# Patient Record
Sex: Male | Born: 1950 | Race: White | Hispanic: No | Marital: Married | Smoking: Never smoker
Health system: Southern US, Community
[De-identification: ages and names within clinical notes are randomized; demographics above are authoritative.]

## PROBLEM LIST (undated history)

## (undated) DIAGNOSIS — I639 Cerebral infarction, unspecified: Secondary | ICD-10-CM

---

## 2017-10-03 ENCOUNTER — Encounter: Payer: Self-pay | Admitting: Occupational Therapy

## 2017-10-03 ENCOUNTER — Ambulatory Visit: Payer: Medicare Other | Admitting: Occupational Therapy

## 2017-10-03 ENCOUNTER — Ambulatory Visit: Payer: Medicare Other | Attending: Physical Medicine and Rehabilitation | Admitting: Speech Pathology

## 2017-10-03 DIAGNOSIS — I69351 Hemiplegia and hemiparesis following cerebral infarction affecting right dominant side: Secondary | ICD-10-CM

## 2017-10-03 DIAGNOSIS — M6281 Muscle weakness (generalized): Secondary | ICD-10-CM | POA: Insufficient documentation

## 2017-10-03 DIAGNOSIS — R29818 Other symptoms and signs involving the nervous system: Secondary | ICD-10-CM | POA: Diagnosis present

## 2017-10-03 DIAGNOSIS — R278 Other lack of coordination: Secondary | ICD-10-CM

## 2017-10-03 DIAGNOSIS — I69318 Other symptoms and signs involving cognitive functions following cerebral infarction: Secondary | ICD-10-CM

## 2017-10-03 DIAGNOSIS — R482 Apraxia: Secondary | ICD-10-CM | POA: Insufficient documentation

## 2017-10-03 DIAGNOSIS — R4701 Aphasia: Secondary | ICD-10-CM | POA: Diagnosis not present

## 2017-10-03 NOTE — Patient Instructions (Signed)
  Tips for Talking with People who have Aphasia  . Say one thing at a time . Don't  rush - slow down, be patient . Talk face to face . Reduce background noise . Relax - be natural . Use pen and paper . Write down key words . Draw diagrams or pictures . Don't pretend you understand . Ask what helps . Recap - check you both understand . Be a partner, not a therapist   Aphasia does not affect intelligence, only language. The person with aphasia can still: make decisions, have opinions, and socialize.   Describing words  What group does it belong to?  What do I use it for?  Where can I find it?  What does it LOOK like?  What other words go with it?  What is the 1st sound of the word?  Many Ways to Communicate  Describe it Write it Draw it Gesture it Use related words  There's an App for that: Family Feud, Heads up, Stop-fun categories, What if, Conversation TherAPPy  Provided by: Rolin Barry. ST, [phone]

## 2017-10-03 NOTE — Therapy (Signed)
[facility] [address] [place], [place], [address] Phone: [phone]   Fax:  [phone]  Speech Language Pathology Evaluation  Patient Details  Name: [name] MRN: [id number] Date of Birth: 05-20-1951 Referring Provider: Dr. Lorretta Harp  Encounter Date: 10/03/2017      End of Session - 10/03/17 1429    Date for SLP Re-Evaluation 12/27/17      No past medical history on file.  No past surgical history on file.  There were no vitals filed for this visit.      Subjective Assessment - 10/03/17 1305    Subjective I...uh been through it"   Patient is accompained by: Family member  spouse [name]   Currently in Pain? No/denies            SLP Evaluation [facility] - 10/03/17 1200      SLP Visit Information   Referring Provider Dr. Lorretta Harp   Onset Date 09/11/17   Medical Diagnosis left CVA     Subjective   Patient/Family Stated Goal To make him speak     General Information   HPI [name] a 66 y.o.malewith a PMH of HTN, GERD, and afib (CHADsVASc 3)not on AC,who presents on 9/19 as thrombectomy stroke code with right sided weakness and aphasiaCT Brain was repeated without hemorrhage, some L insula hypodensity, ASPECT 9. He was taken urgently to IR. TICI 3 reperfusion achieved with 1 pass . He required intubation.He was extubated on 9/20 and also had TTE at that time which showed preserved EF, no mass and negative bubble. He was started on aspirin on 9/21 but discontinued on 9/23 when the NICU started him on Heparin gtt with repeat CT head showing evolving Lt MCA stroke but no hemorrhage  Most recent MBSS recommended regular solids/thin liquids. Dysphagia has resolved per spouse. PEG to be removed 10/31/17. Pt taking all PO by mouth.   Mobility Status walks independently, denies balance issues or falls     Prior Functional Status   Cognitive/Linguistic Baseline Within functional limits    Type of Home House    Lives With Spouse   Available Support Family   Vocation Retired     IT consultant   Overall Cognitive Status Difficult to assess   Difficult to assess due to Impaired communication  severe aphasia     Auditory Comprehension   Overall Auditory Comprehension Impaired   Yes/No Questions Impaired   Basic Immediate Environment Questions 25-49% accurate   Complex Questions 0-24% accurate   Commands Impaired   One Step Basic Commands 0-24% accurate   Two Step Basic Commands 0-24% accurate   Conversation Simple   Other Conversation Comments comprehension improves with context cues   EffectiveTechniques Extra processing time;Pausing;Repetition;Slowed speech;Visual/Gestural cues     Reading Comprehension   Reading Status Impaired   Word level 0-25% accurate   Sentence Level 26-50% accurate     Expression   Primary Mode of Expression Verbal     Verbal Expression   Overall Verbal Expression Impaired   Automatic Speech Name;Counting;Day of week;Month of year  80% accurate, minimal phonemic paraphasia vs perseveration   Level of Generative/Spontaneous Verbalization Word  automatic social    Repetition No impairment   Naming Impairment   Responsive 0-25% accurate   Confrontation 0-24% accurate   Convergent Not tested   Divergent Not tested   Verbal Errors Phonemic paraphasias;Perseveration;Aware of errors;Inconsistent   Pragmatics No impairment   Effective Techniques Phonemic cues;Sentence completion;Written cues  Written Expression   Dominant Hand Right   Written Expression Exceptions to [facility]   Copy Ability Word   Self Formulation Ability Word  name only - 60% accurate     Oral Motor/Sensory Function   Overall Oral Motor/Sensory Function Impaired   Labial ROM Reduced right   Lingual ROM Within Functional Limits   Facial ROM Within Functional Limits   Velum Within Functional Limits     Motor Speech   Overall Motor Speech Impaired   Respiration  Within functional limits   Phonation Normal   Resonance Within functional limits   Articulation Within functional limitis   Intelligibility Intelligibility reduced   Word 50-74% accurate   Phrase 50-74% accurate   Motor Planning Impaired   Level of Impairment Phrase   Motor Speech Errors Groping for words;Inconsistent   Effective Techniques Slow rate;Pause                         SLP Education - 10/03/17 1317    Education provided Yes   Education Details aphasia ed; non verbal compensations for comprehension; homework; goals for ST   Person(s) Educated Patient;Spouse   Methods Explanation;Demonstration;Verbal cues;Handout   Comprehension Verbalized understanding;Returned demonstration;Verbal cues required;Need further instruction          SLP Short Term Goals - 10/03/17 1403      SLP SHORT TERM GOAL #1   Title Pt will ID object to spoken word f:3 8/10x with occasional min gestural cues.   Time 4   Period Weeks   Status New     SLP SHORT TERM GOAL #2   Title Pt will ID object to written word f:4 with occasional min A 8/10x   Time 4   Period Weeks   Status New     SLP SHORT TERM GOAL #3   Title Pt will name basic objects with sentence completion cues 8/10x   Time 4   Period Weeks   Status New     SLP SHORT TERM GOAL #4   Title Pt will write biographical information/family names with occasional mod A 4/5x   Time 4   Period Weeks   Status New     SLP SHORT TERM GOAL #5   Title Spouse will utilize gestures and written word cues to communicate with pt in simple conversation of 3-4 turns with occasional min A.   Time 4   Period Weeks   Status New          SLP Long Term Goals - 10/03/17 1411      SLP LONG TERM GOAL #1   Title Pt will ID object to spoken phrase/description f:3 8/10x   Time 12   Period Weeks   Status New     SLP LONG TERM GOAL #2   Title Pt will ID object to written phrase f:4 8/10x and occasional min A   Time 12   Period  Weeks   Status New     SLP LONG TERM GOAL #3   Title Pt will name 5 items in a simple category with occasional min A.   Time 12   Period Weeks   Status New     SLP LONG TERM GOAL #4   Title Pt will write 1-2 words in sentence completion task 8/10x with occasional min A   Time 12   Period Weeks   Status New     SLP LONG TERM GOAL #5   Title Pt/spouse will  utilize multimodal communication for simple conversation of 4-5 turns with occasional min A over 2 sessions.    Time 12   Period Weeks   Status New          Plan - October 23, 2017 1415    Clinical Impression Statement [name] is a 66 y.o. 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 He is accompanied by his spouse, [name].  Auditory comprehension of 1 step commands, simple yes/no questions is less than 50% accurate. Auditory comprehension within a context improves somewhat. Repetition is relatively intact. Automatic speech is realatively intact. 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 Pt does have some spontaneous greetings/social responses. Confrontation naming of basic object is less than 50%. Responsive naming is less than 50%. Semantic and phonemic cues were not successful assistance for naming. Pt did somewhat better with phrase completion for naming, however he perseverated on repeating my phrase, semantic cues or questions.  Reading comprehension at word level for body parts and environmental objects  is less than 50% accurate. Pt wrote his name with 70% accuracy and some aphasic errors. He has right side UE weakness and used pencil with larger grip.  Pt completed opposites and common pairs with 85% accuracy  and recited the Lord's Prayer and Pledge with phonemic paraphasias (again, more automatic type speech). Pt and spouse were educated re: aphasia and introduced to non verbal compensations to help communication at home. Simple conversation with 1-2 word non fluent utterances with phonemic paraphasias. I recommend skilled ST to maximize communication (comprehension and expression) for return to independence.    Speech Therapy Frequency 3x / week   Duration --  12 weeks   Treatment/Interventions Internal/external aids;Compensatory strategies;Environmental controls;Compensatory techniques;Language facilitation;Cueing hierarchy;Multimodal communcation approach;Functional tasks;Patient/family education;SLP instruction and feedback;Cognitive reorganization   Potential to Achieve Goals Good   Potential Considerations Severity of impairments   Consulted and Agree with Plan of Care Patient;Family member/caregiver   Family Member Consulted spouse [name]      Patient will benefit from skilled therapeutic intervention in order to improve the following deficits and impairments:   Aphasia - Plan: SLP plan of care cert/re-cert      G-Codes - 10-23-2017 1319    Functional Assessment Tool Used NOMS   Functional Limitations Spoken language expressive   Spoken Language Expression Current Status [phone]) At least 60 percent but less than 80 percent impaired, limited or restricted   Spoken Language Expression Goal Status (U0454) At least 20 percent but less than 40 percent impaired, limited or restricted      Problem List There are no active problems to display for this patient.   Sion Thane, Radene Journey MS, CCC-SLP Oct 23, 2017, 2:31 PM  [facility] [address] [place], [place], [address] Phone: [phone]   Fax:  [phone]  Name: [name] MRN: [id number] Date of Birth: 08/06/1951

## 2017-10-04 NOTE — Therapy (Signed)
[facility] [address] [place], [place], [address] Phone: [phone]   Fax:  [phone]  Occupational Therapy Evaluation  Patient Details  Name: [name] MRN: [id number] Date of Birth: 10-20-1951 Referring Provider: L MCA CVA  Encounter Date: 10/03/2017      OT End of Session - 10/03/17 1233    Visit Number 1   Number of Visits 16   Date for OT Re-Evaluation 11/28/17   Authorization Type medicare will need PN and G code every 10th visit   Authorization Time Period 60 days   Authorization - Visit Number 1   Authorization - Number of Visits 10   OT Start Time 1317   OT Stop Time 1359   OT Time Calculation (min) 42 min   Activity Tolerance Patient tolerated treatment well      History reviewed. No pertinent past medical history.  History reviewed. No pertinent surgical history.  There were no vitals filed for this visit.      Subjective Assessment - 10/03/17 1317    Subjective  How are you? Pt able to utter spontaneous familiar phrases   Patient is accompained by: Family member  wife [name]   Patient Stated Goals Pt globally aphasic   Currently in Pain? No/denies           [facility] OT Assessment - 10/03/17 1318      Assessment   Referring Provider L MCA CVA/ Hoy Register   Onset Date 09/11/17   Prior Therapy inpt rehab at [place], [place], OT and ST.     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   Leisure working on cars, yard work, church work     ADL   Eating/Feeding Maximal assistance  pt apraxic, difficulty in using hand appropriately   Grooming Minimal assistance  wife assists with shaving   Upper Body Bathing Supervision/safety   Lower Body Bathing Supervision/safety   Occupational hygienist - Clothing Manipulation Modified independent   Toileting -  Hygiene Supervision/safety   ADL comments Wife states pt has only been home 2 days and she is closely supervising     IADL   Shopping Needs to be accompanied on any shopping trip   Light Housekeeping Needs help with all home maintenance tasks   Meal Prep --  n/a pt did not cook at all   [employer] on family or friends for transportation   Medication Management Is not capable of dispensing or managing own medication   Financial Management Dependent     Mobility   Mobility Status Needs assist   Mobility Status Comments supevision in the home and community due to cognitive and language deficits  Written Expression   Dominant Hand Right   Handwriting 75% legible  for name     Vision - History   Baseline Vision Wears glasses only for reading     Vision Assessment   Eye Alignment Impaired (comment)  mild malalignment   Ocular Range of Motion Within Functional Limits   Tracking/Visual Pursuits Able to track stimulus in all quads without difficulty   Comment Pt with aphasia but appear to be indicating some blurry vision     Activity Tolerance   Activity Tolerance Tolerates 30 min activity with muliple rests     Cognition   Mini Mental State Exam  Pt is aphasic and also apraxic therefore very difficult to assess cognition will continue to asses via functional tasks.     Sensation   Light  Touch Appears Intact;Impaired by gross assessment   Hot/Cold Appears Intact  wife states pt has adjusted the water temp in the sink   Additional Comments pt is able to communicate pins and needles in R hand.      Coordination   Gross Motor Movements are Fluid and Coordinated No   Fine Motor Movements are Fluid and Coordinated No   Other mild incoordination;  pt with signficant apraxia     Perception   Perception Impaired  r inattention     Praxis   Praxis Impaired   Praxis Impairment Details Ideomotor     Edema   Edema mild R hand     ROM / Strength   AROM / PROM / Strength AROM;Strength     AROM   Overall AROM  Within functional limits for tasks performed   Overall AROM Comments BUE's     Strength   Overall Strength Deficits   Overall Strength Comments Difficult to assess due to apraxia, inattention and language deficits. Pt has signficant R pronator drift even with eyes open so at least 3/5.       Hand Function   Right Hand Gross Grasp Impaired   Right Hand Grip (lbs) 45   Left Hand Gross Grasp Functional   Left Hand Grip (lbs) 60                           OT Short Term Goals - 10/03/17 1219      OT SHORT TERM GOAL #1   Title Pt and wife will be mod I with HEP - 10/31/2017   Status New     OT SHORT TERM GOAL #2   Title Pt will demonstrate improved grip strength by 5 pounds to assist with functional tasks (baseline=45)   Status New     OT SHORT TERM GOAL #3   Title Pt will be supervision to shave with electric razor   Status New     OT SHORT TERM GOAL #4   Title Pt will be min a for self feeding   Status New     OT SHORT TERM GOAL #5   Title Pt will be  mod I with bathing at shower level   Status New     OT SHORT TERM GOAL #6   Title Pt will demonstrate ability to use RUE as dominant for basic ADL tasks 25% of the time with cueing   Status New           OT Long Term Goals - 10/04/17 1222      OT LONG TERM GOAL #1   Title Pt  and wife  will be mod I with upgraded HEP - 11/28/2017   Status New     OT LONG TERM GOAL #2   Title Pt will demonstrate improved grip strength by 7 pounds to assist wtih functional tasks (baseline = 45)   Status New     OT LONG TERM GOAL #3   Title Pt will be mod I with eating   Status New     OT LONG TERM GOAL #4   Title Pt will be  mod I with dresssing.   Status New     OT LONG TERM GOAL #5   Title Pt will be mod I with shower transfers   Status New     OT LONG TERM GOAL #6   Title Pt will be mod I with toilet transfers/toileting   Status New     OT LONG TERM GOAL #7   Title Pt will use RUE as dominant at least 50% of the time during ADL activities   Status New     OT LONG TERM GOAL #8   Title Pt will be able to copy name with 100% legibility   Status New               Plan - 10/12/2017 1226    Clinical Impression Statement Pt is a 66 year old male s/p large L MCVA CVA on 0/19/2018. Pt was intubated on 09/11/2017 and extubated on 09/12/2017. Pt was discharged home with wife on 11/01/2017 after inpt rehab stay at [facility].  Pt presents today with the following deficits that impact ADL, IADL, work, leisure and social participation:  R dominant hemiplegia, apraxia, global aphasia, decreased strength, decreased coordination, decreased functional use of RUE, impaired sensation, possible cognitive deficits.  Pt will benefit from skilled OT to address these deficits to maxixmize independence.    Occupational Profile and client history currently impacting functional performance GERD, HTN, Afib, ARF, s/p intubation,    Occupational performance deficits (Please refer to evaluation for details): ADL's;IADL's;Work;Play;Social Participation   Rehab Potential Good   OT Frequency 2x / week   OT Duration 8 weeks   OT Treatment/Interventions Self-care/ADL training;Electrical Stimulation;DME and/or AE instruction;Neuromuscular education;Therapeutic exercise;Therapeutic  activities;Patient/family education;Cognitive remediation/compensation   Plan initiate as possible HEP based primarily on functional tasks, begin to address eating and ADL strategies, NMR for RUE   Consulted and Agree with Plan of Care Patient;Family member/caregiver   Family Member Consulted wife [name]      Patient will benefit from skilled therapeutic intervention in order to improve the following deficits and impairments:  Decreased activity tolerance, Decreased coordination, Decreased cognition, Decreased knowledge of use of DME, Decreased strength, Impaired UE functional use, Impaired sensation  Visit Diagnosis: Hemiplegia and hemiparesis following cerebral infarction affecting right dominant side (HCC) - Plan: Ot plan of care cert/re-cert  Other lack of coordination - Plan: Ot plan of care cert/re-cert  Apraxia - Plan: Ot plan of care cert/re-cert  Other symptoms and signs involving cognitive functions following cerebral infarction - Plan: Ot plan of care cert/re-cert  Other symptoms and signs involving the nervous system - Plan: Ot plan of care cert/re-cert  Muscle weakness (generalized) - Plan: Ot plan of care cert/re-cert      G-Codes - 12-Oct-2017 1235    Functional Assessment Tool Used (Outpatient only) skilled clinical observation, dynamometer   Functional Limitation Carrying, moving and handling objects   Carrying, Moving and Handling Objects Current Status (Z6109) At least 80 percent but less than 100 percent impaired, limited  or restricted   Carrying, Moving and Handling Objects Goal Status [phone]) At least 20 percent but less than 40 percent impaired, limited or restricted      Problem List There are no active problems to display for this patient.   Norton Pastel, OTR/L 10/04/2017, 12:38 PM  [facility] [address] [place], [place], [address] Phone: [phone]   Fax:  [phone]  Name:  [name] MRN: [id number] Date of Birth: 10/31/1951

## 2017-10-08 ENCOUNTER — Ambulatory Visit: Payer: Medicare Other | Admitting: Speech Pathology

## 2017-10-14 ENCOUNTER — Encounter: Payer: Self-pay | Admitting: Occupational Therapy

## 2017-10-14 ENCOUNTER — Ambulatory Visit: Payer: Medicare Other | Admitting: Occupational Therapy

## 2017-10-14 ENCOUNTER — Ambulatory Visit: Payer: Medicare Other

## 2017-10-14 DIAGNOSIS — I69351 Hemiplegia and hemiparesis following cerebral infarction affecting right dominant side: Secondary | ICD-10-CM

## 2017-10-14 DIAGNOSIS — I69318 Other symptoms and signs involving cognitive functions following cerebral infarction: Secondary | ICD-10-CM

## 2017-10-14 DIAGNOSIS — R29818 Other symptoms and signs involving the nervous system: Secondary | ICD-10-CM

## 2017-10-14 DIAGNOSIS — R278 Other lack of coordination: Secondary | ICD-10-CM

## 2017-10-14 DIAGNOSIS — R4701 Aphasia: Secondary | ICD-10-CM | POA: Diagnosis not present

## 2017-10-14 DIAGNOSIS — R482 Apraxia: Secondary | ICD-10-CM

## 2017-10-14 DIAGNOSIS — M6281 Muscle weakness (generalized): Secondary | ICD-10-CM

## 2017-10-14 NOTE — Therapy (Signed)
[facility] [address] [place], [place], [address] Phone: [phone]   Fax:  [phone]  Speech Language Pathology Treatment  Patient Details  Name: [name] MRN: [id number] Date of Birth: 11/29/1951 Referring Provider: Dr. Lorretta Harp  Encounter Date: 10/14/2017      End of Session - 10/14/17 1237    Visit Number 2   Number of Visits 24   Date for SLP Re-Evaluation 12/27/17   SLP Start Time 1104   SLP Stop Time  1150   SLP Time Calculation (min) 46 min   Activity Tolerance Patient tolerated treatment well      No past medical history on file.  No past surgical history on file.  There were no vitals filed for this visit.      Subjective Assessment - 10/14/17 1115    Subjective "I used to not." (pt, re: whether OT follows ST today)   Patient is accompained by: Family member  wife, [name]               ADULT SLP TREATMENT - 10/14/17 1116      General Information   Behavior/Cognition Alert;Pleasant mood;Cooperative;Requires cueing     Treatment Provided   Treatment provided Cognitive-Linquistic     Pain Assessment   Pain Assessment 0-10   Pain Score 3   SLP used fingers to A pt with expression   Pain Location abdomen (PEG site)   Pain Descriptors / Indicators Sore     Cognitive-Linquistic Treatment   Treatment focused on Aphasia;Patient/family/caregiver education   Skilled Treatment SLP facilitated pt's expressive language by needing to use cloze phrases with confrontation naming 100% of the time. Heavy perseveration noted. SLP used the same objects for written word ID (f:3 - goal is f:4) with total A usually. SLP educated wife and pt how to cue pt at home and some home tasks they could perform together. Wfe very receptive to SLP suggestions.      Assessment / Recommendations / Plan   Plan Continue with current plan of care     Progression Toward Goals   Progression toward goals  Progressing toward goals          SLP Education - 10/14/17 1237    Education provided Yes   Education Details home tasks, how to cue pt, cue heirarchy   Person(s) Educated Patient;Spouse   Methods Explanation;Demonstration   Comprehension Verbalized understanding;Need further instruction          SLP Short Term Goals - 10/14/17 1241      SLP SHORT TERM GOAL #1   Title Pt will ID object to spoken word f:3 8/10x with occasional min gestural cues.   Time 4   Period Weeks   Status On-going     SLP SHORT TERM GOAL #2   Title Pt will ID object to written word f:4 with occasional min A 8/10x   Time 4   Period Weeks   Status On-going     SLP SHORT TERM GOAL #3   Title Pt will name basic objects with sentence completion cues 8/10x   Time 4   Period Weeks   Status On-going     SLP SHORT TERM GOAL #4   Title Pt will write biographical information/family names with occasional mod A 4/5x   Time 4   Period Weeks   Status On-going     SLP SHORT TERM GOAL #5   Title Spouse will utilize gestures and written word cues to  communicate with pt in simple conversation of 3-4 turns with occasional min A.   Time 4   Period Weeks   Status On-going          SLP Long Term Goals - 10/14/17 1241      SLP LONG TERM GOAL #1   Title Pt will ID object to spoken phrase/description f:3 8/10x   Time 12   Period Weeks   Status On-going     SLP LONG TERM GOAL #2   Title Pt will ID object to written phrase f:4 8/10x and occasional min A   Time 12   Period Weeks   Status On-going     SLP LONG TERM GOAL #3   Title Pt will name 5 items in a simple category with occasional min A.   Time 12   Period Weeks   Status On-going     SLP LONG TERM GOAL #4   Title Pt will write 1-2 words in sentence completion task 8/10x with occasional min A   Time 12   Period Weeks   Status On-going     SLP LONG TERM GOAL #5   Title Pt/spouse will utilize multimodal communication for simple conversation  of 4-5 turns with occasional min A over 2 sessions.    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 Error awareness for verbal language was minimal, even after SLP showing pt his error, appearance of awareness of verbal errors may have improved slightly wiht SLP facial expressions for incorrect production/s. Limited success today with comprehension single words f:3 (goal f:4). Skilled ST remains essential for improvement in verbal and comprehension skills.    Speech Therapy Frequency 3x / week   Duration --  12 weeks   Treatment/Interventions Internal/external aids;Compensatory strategies;Environmental controls;Compensatory techniques;Language facilitation;Cueing hierarchy;Multimodal communcation approach;Functional tasks;Patient/family education;SLP instruction and feedback;Cognitive reorganization   Potential to Achieve Goals Good   Potential Considerations Severity of impairments   Consulted and Agree with Plan of Care Patient;Family member/caregiver   Family Member Consulted spouse Matthew OppenheimKitty      Patient will benefit from skilled therapeutic intervention in order to improve the following deficits and impairments:   Aphasia    Problem List There are no active problems to display for this patient.   [facility] Hospital Navicent HealthCHINKE,Matthew Stanke ,MS, CCC-SLP  10/14/2017, 12:43 PM  [facility] [address] [place], [place], [address] Phone: [phone]   Fax:  [phone]   Name: [name] MRN: [id number] Date of Birth: 10/16/1951

## 2017-10-14 NOTE — Patient Instructions (Addendum)
1. Grip Strengthening (Resistive Putty)   Squeeze putty using thumb and all fingers. Repeat _20___ times. Do __2__ sessions per day.   2. Roll putty into tube on table and pinch using your thumb, index and middle finger. Do 5 times. Do 2 sessions per day.    Copyright  VHI. All rights reserved.   Coordination Activities  Perform the following activities for 5-10 minutes 2  times per day with right hand(s).   Flip cards 1 at a time as fast as you can.  Pick up coins and place in container or coin bank.  Screw together nuts and bolts, then unfasten.

## 2017-10-14 NOTE — Therapy (Signed)
[facility] [address] [place], [place], [address] Phone: [phone]   Fax:  [phone]  Occupational Therapy Treatment  Patient Details  Name: [name] MRN: [id number] Date of Birth: July 02, 1951 Referring Provider: L MCA CVA  Encounter Date: 10/14/2017      OT End of Session - 10/14/17 1329    Visit Number 2   Number of Visits 16   Date for OT Re-Evaluation 11/28/17   Authorization Type medicare will need PN and G code every 10th visit   Authorization Time Period 60 days   Authorization - Visit Number 2   Authorization - Number of Visits 10   OT Start Time 1231   OT Stop Time 1317   OT Time Calculation (min) 46 min   Activity Tolerance Patient tolerated treatment well      History reviewed. No pertinent past medical history.  History reviewed. No pertinent surgical history.  There were no vitals filed for this visit.      Subjective Assessment - 10/14/17 1233    Subjective  Like this? (when instructing for HEP for RUE)   Patient is accompained by: Family member  wife kitty   Patient Stated Goals Pt globally aphasic   Currently in Pain? Yes   Pain Score --  unable to rate   Pain Location Abdomen  peg tube   Pain Descriptors / Indicators --  unable due to global aphasia   Pain Relieving Factors repositioned tube and pt reported less discomfort. Wife reports that it looks irritated. Recommended wife may want to have primary MD look at site as tube placed a month ago.                       OT Treatments/Exercises (OP) - 10/14/17 0001      ADLs   Eating Issued red foam for self feeding as pt tends to "drop" utensil due to apraxia and inattention to RUE. Also issued for toothbrush. Began instruction for use of interruption of activity for apraxia - pt and wife will need further education.  Discussed limited use of constraint induced when pt is engaged in unilateral task with RUE.   Discussed importance of repetition and practice for apraxia.    Grooming Wife to Psychologist, sport and exercise as pt is on blood thinners.    ADL Comments Reviewed goals and POC with pt and wife - both in agreement.  Written copy of goals provided.     Exercises   Exercises Hand     Hand Exercises   Theraputty Grip;Pinch   Theraputty - Grip Pt issued HEP for grip and pinch strengthening with green putty. Pt able to return demonstrate with after gestures, cues and demonstration. Instructed wife to cue with gesture and simple vc together due to apraxia and aphasia.     Fine Motor Coordination   Other Fine Motor Exercises Pt and wife instructed in modified HEP for coordination - see pt instruction section for details.  Pt able to return demonstrate after gestures, cues and practice.                  OT Education - 10/14/17 1319    Education provided Yes   Education Details putty and modified coordination program for RUE, strategies for apraxia during self feeding   Person(s) Educated Patient;Spouse   Methods Explanation;Demonstration;Verbal cues;Handout   Comprehension Verbalized understanding;Returned demonstration  wife can cue prn due to apraxia and aphasia  OT Short Term Goals - 10/14/17 1327      OT SHORT TERM GOAL #1   Title Pt and wife will be mod I with HEP - 10/31/2017   Status On-going     OT SHORT TERM GOAL #2   Title Pt will demonstrate improved grip strength by 5 pounds to assist with functional tasks (baseline=45)   Status On-going     OT SHORT TERM GOAL #3   Title Pt will be supervision to shave with electric razor   Status On-going     OT SHORT TERM GOAL #4   Title Pt will be min a for self feeding   Status On-going     OT SHORT TERM GOAL #5   Title Pt will be  mod I with bathing at shower level   Status On-going     OT SHORT TERM GOAL #6   Title Pt will demonstrate ability to use RUE as dominant for basic ADL tasks 25% of the time with cueing    Status On-going           OT Long Term Goals - 10/14/17 1327      OT LONG TERM GOAL #1   Title Pt and wife will be mod I with upgraded HEP - 11/28/2017   Status On-going     OT LONG TERM GOAL #2   Title Pt will demonstrate improved grip strength by 7 pounds to assist wtih functional tasks (baseline = 45)   Status On-going     OT LONG TERM GOAL #3   Title Pt will be mod I with eating   Status On-going     OT LONG TERM GOAL #4   Title Pt will be  mod I with dresssing.   Status On-going     OT LONG TERM GOAL #5   Title Pt will be mod I with shower transfers   Status On-going     OT LONG TERM GOAL #6   Title Pt will be mod I with toilet transfers/toileting   Status On-going     OT LONG TERM GOAL #7   Title Pt will use RUE as dominant at least 50% of the time during ADL activities   Status On-going     OT LONG TERM GOAL #8   Title Pt will be able to copy name with 100% legibility   Status On-going               Plan - 10/14/17 1328    Clinical Impression Statement Pt and wife in agreement with goals.  Pt progressing toward goals.    Rehab Potential Good   OT Frequency 2x / week   OT Duration 8 weeks   OT Treatment/Interventions Self-care/ADL training;Electrical Stimulation;DME and/or AE instruction;Neuromuscular education;Therapeutic exercise;Therapeutic activities;Patient/family education;Cognitive remediation/compensation   Plan check HEP, functional use of RUE, constraint induced activities, strategies for ADL's prn   Consulted and Agree with Plan of Care Patient;Family member/caregiver   Family Member Consulted wife [name]      Patient will benefit from skilled therapeutic intervention in order to improve the following deficits and impairments:  Decreased activity tolerance, Decreased coordination, Decreased cognition, Decreased knowledge of use of DME, Decreased strength, Impaired UE functional use, Impaired sensation  Visit Diagnosis: Hemiplegia and  hemiparesis following cerebral infarction affecting right dominant side (HCC)  Other lack of coordination  Apraxia  Other symptoms and signs involving cognitive functions following cerebral infarction  Other symptoms and signs involving the nervous system  Muscle  weakness (generalized)    Problem List There are no active problems to display for this patient.   Norton Pastelulaski, Karen Halliday, OTR/L 10/14/2017, 1:31 PM  [facility] [address] [place], [place], [address] Phone: [phone]   Fax:  [phone]  Name: [name] MRN: [id number] Date of Birth: 03/21/1951

## 2017-10-15 ENCOUNTER — Encounter: Payer: Self-pay | Admitting: Occupational Therapy

## 2017-10-15 ENCOUNTER — Ambulatory Visit: Payer: Medicare Other | Admitting: Speech Pathology

## 2017-10-15 ENCOUNTER — Ambulatory Visit: Payer: Medicare Other | Admitting: Occupational Therapy

## 2017-10-15 DIAGNOSIS — I69351 Hemiplegia and hemiparesis following cerebral infarction affecting right dominant side: Secondary | ICD-10-CM

## 2017-10-15 DIAGNOSIS — R29818 Other symptoms and signs involving the nervous system: Secondary | ICD-10-CM

## 2017-10-15 DIAGNOSIS — R482 Apraxia: Secondary | ICD-10-CM

## 2017-10-15 DIAGNOSIS — R4701 Aphasia: Secondary | ICD-10-CM

## 2017-10-15 DIAGNOSIS — I69318 Other symptoms and signs involving cognitive functions following cerebral infarction: Secondary | ICD-10-CM

## 2017-10-15 DIAGNOSIS — R278 Other lack of coordination: Secondary | ICD-10-CM

## 2017-10-15 DIAGNOSIS — M6281 Muscle weakness (generalized): Secondary | ICD-10-CM

## 2017-10-15 NOTE — Therapy (Signed)
[facility] [address] [place], [place], [address] Phone: [phone]   Fax:  [phone]  Speech Language Pathology Treatment  Patient Details  Name: [name] MRN: [id number] Date of Birth: 01/14/1951 Referring Provider: Dr. Lorretta HarpJairon Downs  Encounter Date: 10/15/2017      End of Session - 10/15/17 1213    Visit Number 3   Number of Visits 24   Date for SLP Re-Evaluation 12/27/17   SLP Start Time 1051   SLP Stop Time  1145   SLP Time Calculation (min) 54 min   Activity Tolerance Patient tolerated treatment well      No past medical history on file.  No past surgical history on file.  There were no vitals filed for this visit.      Subjective Assessment - 10/15/17 1054    Subjective "He worked on naming and gesturing objects but he was tired"   Patient is accompained by: Family member   Currently in Pain? No/denies               ADULT SLP TREATMENT - 10/15/17 1055      General Information   Behavior/Cognition Alert;Pleasant mood;Cooperative;Requires cueing     Treatment Provided   Treatment provided Cognitive-Linquistic     Pain Assessment   Pain Assessment No/denies pain     Cognitive-Linquistic Treatment   Treatment focused on Aphasia;Patient/family/caregiver education   Skilled Treatment Verbal expression targeted with confrontation naming utilizing sentnece completion (cloze) cues 80%, spontaneous naming 20%.  Pt ID'd object f:3 to spoken word paried with gesture and/or function (semantic) cues frequently 75% accuracy. It should be noted that pt has some right neglect and benefitted from objects and words being place more to his left.  Reading comprehension facilitated by matching written word to object f: 4 with usual gestural cues with 75% accuracy. Pt matched written word to written word in f:4-5 with occasional min A. Initiated training in [employer] for home practice. Pt  completed word copy completion, and word copy with rare min A. He required max A to follow directions for Repeat Word. Naming Pictures required cloze cues. ST instructed spouse to use sentence/phrase completion, rather than tell pt the answer. She is also instructed to manage the start and stop and for her to say the word for the app after Maurine MinisterDennis has said the word, as coordinating this is difficult due to Maurine MinisterDennis' reduced comprehension.      Assessment / Recommendations / Plan   Plan Continue with current plan of care     Progression Toward Goals   Progression toward goals Progressing toward goals          SLP Education - 10/15/17 1205    Comprehension Returned demonstration;Verbalized understanding;Verbal cues required;Need further instruction          SLP Short Term Goals - 10/15/17 1213      SLP SHORT TERM GOAL #1   Title Pt will ID object to spoken word f:3 8/10x with occasional min gestural cues.   Time 4   Period Weeks   Status On-going     SLP SHORT TERM GOAL #2   Title Pt will ID object to written word f:4 with occasional min A 8/10x   Time 4   Period Weeks   Status On-going     SLP SHORT TERM GOAL #3   Title Pt will name basic objects with sentence completion cues 8/10x   Time 4   Period Weeks  Status On-going     SLP SHORT TERM GOAL #4   Title Pt will write biographical information/family names with occasional mod A 4/5x   Time 4   Period Weeks   Status On-going     SLP SHORT TERM GOAL #5   Title Spouse will utilize gestures and written word cues to communicate with pt in simple conversation of 3-4 turns with occasional min A.   Time 4   Period Weeks   Status On-going          SLP Long Term Goals - 10/15/17 1213      SLP LONG TERM GOAL #1   Title Pt will ID object to spoken phrase/description f:3 8/10x   Time 12   Period Weeks   Status On-going     SLP LONG TERM GOAL #2   Title Pt will ID object to written phrase f:4 8/10x and occasional min A    Time 12   Period Weeks   Status On-going     SLP LONG TERM GOAL #3   Title Pt will name 5 items in a simple category with occasional min A.   Time 12   Period Weeks   Status On-going     SLP LONG TERM GOAL #4   Title Pt will write 1-2 words in sentence completion task 8/10x with occasional min A   Time 12   Period Weeks   Status On-going     SLP LONG TERM GOAL #5   Title Pt/spouse will utilize multimodal communication for simple conversation of 4-5 turns with occasional min A over 2 sessions.    Time 12   Period Weeks   Status On-going          Plan - 10/15/17 1205    Clinical Impression Statement Pt continues with severe non fluent aphasia. Error awareness 2x today spontaneously, otherwise he required max A. Auditory and readin comprehension at word level require frequent gestural and semantic cues. Confrontation naming reqiured frequent sentence/phrase (cloze) completion cues. Spontaneous greetings with minimal aphasic errors. Introduced pt and spouse to [employer] for home practice. Continue skilled ST to maximize expression and comprehension for basic wants/needs and social communication.    Speech Therapy Frequency 3x / week   Treatment/Interventions Internal/external aids;Compensatory strategies;Environmental controls;Compensatory techniques;Language facilitation;Cueing hierarchy;Multimodal communcation approach;Functional tasks;Patient/family education;SLP instruction and feedback;Cognitive reorganization   Potential to Achieve Goals Good   Potential Considerations Severity of impairments   Consulted and Agree with Plan of Care Patient;Family member/caregiver   Family Member Consulted spouse [name]      Patient will benefit from skilled therapeutic intervention in order to improve the following deficits and impairments:   Aphasia  Apraxia    Problem List There are no active problems to display for this patient.   Emilio Baylock, Radene Journey MS,  CCC-SLP 10/15/2017, 12:14 PM  [facility] [address] [place], [place], [address] Phone: [phone]   Fax:  [phone]   Name: Trinidad Petron MRN: [id number] Date of Birth: 1951-11-23

## 2017-10-15 NOTE — Therapy (Signed)
[facility] [address] [place], [place], [address] Phone: [phone]   Fax:  [phone]  Occupational Therapy Treatment  Patient Details  Name: [name] MRN: [id number] Date of Birth: 1951/11/30 Referring Provider: L MCA CVA  Encounter Date: 10/15/2017      OT End of Session - 10/15/17 1237    Visit Number 3   Number of Visits 16   Date for OT Re-Evaluation 11/28/17   Authorization Type medicare will need PN and G code every 10th visit   Authorization Time Period 60 days   Authorization - Visit Number 3   Authorization - Number of Visits 10   OT Start Time 1148   OT Stop Time 1231   OT Time Calculation (min) 43 min   Activity Tolerance Patient tolerated treatment well      History reviewed. No pertinent past medical history.  History reviewed. No pertinent surgical history.  There were no vitals filed for this visit.      Subjective Assessment - 10/15/17 1154    Patient is accompained by: Family member  wife [name]   Pertinent History s/p L CVA   Patient Stated Goals Pt globally aphasic   Currently in Pain? No/denies                      OT Treatments/Exercises (OP) - 10/15/17 0001      ADLs   Eating Addressed eating using R hand, cutting soft food using both hands, and drinking using R hand.  Provided wife with strategies for assisting pt in independence including use of a mirror, task interruption for apraxia, and redirection.  DIscussed allowing pt to try activities before jumping to assist and then only assisting as much as necessary (i.e. backward chaining).    UB Dressing Wife to bring in button down shirt next session as pt is currently wearing sweat suits and pt did not wear these prior to stroke.     Bathing Wife reports pt is bathing at sink level (due to peg tube pt is reluctant to shower).  Wife reports that pt is now only needing an occasional vc for sequencing or to  put on deodorant.                  OT Education - 10/15/17 1234    Education provided Yes   Education Details strategies for self feeding and encouraging use of RUE with eating/drinking   Person(s) Educated Patient;Spouse   Methods Explanation;Demonstration;Tactile cues;Verbal cues   Comprehension Verbalized understanding;Returned demonstration          OT Short Term Goals - 10/15/17 1234      OT SHORT TERM GOAL #1   Title Pt and wife will be mod I with HEP - 10/31/2017   Status On-going     OT SHORT TERM GOAL #2   Title Pt will demonstrate improved grip strength by 5 pounds to assist with functional tasks (baseline=45)   Status On-going     OT SHORT TERM GOAL #3   Title Pt will be supervision to shave with electric razor   Status On-going     OT SHORT TERM GOAL #4   Title Pt will be min a for self feeding   Status On-going     OT SHORT TERM GOAL #5   Title Pt will be  mod I with bathing at shower level   Status On-going     OT SHORT TERM GOAL #6  Title Pt will demonstrate ability to use RUE as dominant for basic ADL tasks 25% of the time with cueing   Status On-going           OT Long Term Goals - 10/15/17 1235      OT LONG TERM GOAL #1   Title Pt and wife will be mod I with upgraded HEP - 11/28/2017   Status On-going     OT LONG TERM GOAL #2   Title Pt will demonstrate improved grip strength by 7 pounds to assist wtih functional tasks (baseline = 45)   Status On-going     OT LONG TERM GOAL #3   Title Pt will be mod I with eating   Status On-going     OT LONG TERM GOAL #4   Title Pt will be  mod I with dresssing.   Status On-going     OT LONG TERM GOAL #5   Title Pt will be mod I with shower transfers   Status On-going     OT LONG TERM GOAL #6   Title Pt will be mod I with toilet transfers/toileting   Status On-going     OT LONG TERM GOAL #7   Title Pt will use RUE as dominant at least 50% of the time during ADL activities   Status  On-going     OT LONG TERM GOAL #8   Title Pt will be able to copy name with 100% legibility   Status On-going               Plan - 10/15/17 1235    Clinical Impression Statement Pt progressing toward goals. Wife reports that pt is using R hand more at home.   Rehab Potential Good   OT Frequency 2x / week   OT Duration 8 weeks   OT Treatment/Interventions Self-care/ADL training;Electrical Stimulation;DME and/or AE instruction;Neuromuscular education;Therapeutic exercise;Therapeutic activities;Patient/family education;Cognitive remediation/compensation   Plan functional use of RUE, constraint induced activities, strategies for ADL's prn, incorporate cognition   Consulted and Agree with Plan of Care Patient;Family member/caregiver   Family Member Consulted wife [name]      Patient will benefit from skilled therapeutic intervention in order to improve the following deficits and impairments:  Decreased activity tolerance, Decreased coordination, Decreased cognition, Decreased knowledge of use of DME, Decreased strength, Impaired UE functional use, Impaired sensation  Visit Diagnosis: Hemiplegia and hemiparesis following cerebral infarction affecting right dominant side (HCC)  Other lack of coordination  Apraxia  Other symptoms and signs involving cognitive functions following cerebral infarction  Other symptoms and signs involving the nervous system  Muscle weakness (generalized)    Problem List There are no active problems to display for this patient.   Norton Pastelulaski, Karen Halliday, OTR/L 10/15/2017, 12:38 PM  [facility] [address] [place], [place], [address] Phone: [phone]   Fax:  [phone]  Name: Matthew FerdinandDennis Rojas MRN: [id number] Date of Birth: 11/25/1951

## 2017-10-15 NOTE — Patient Instructions (Signed)
   Keep all of Maurine MinisterDennis' work in his folder  You can use index cards to print words and have him say/match/read with his own items  Feel free to bring in any pictures of family, friends, pets or any items that are relevant to him and we can use them in therapy  [facility] username: [email or web address]                             Password: [email or web address]  Bring in tablet-

## 2017-10-17 ENCOUNTER — Ambulatory Visit: Payer: Medicare Other | Admitting: Speech Pathology

## 2017-10-17 DIAGNOSIS — R4701 Aphasia: Secondary | ICD-10-CM

## 2017-10-17 NOTE — Therapy (Signed)
[facility] [address] [place], [place], [address] Phone: [phone]   Fax:  [phone]  Speech Language Pathology Treatment  Patient Details  Name: [name] MRN: [id number] Date of Birth: 06/26/1951 Referring Provider: Dr. Lorretta HarpJairon Downs  Encounter Date: 10/17/2017      End of Session - 10/17/17 1211    Visit Number 4   Number of Visits 24   Date for SLP Re-Evaluation 12/27/17   SLP Start Time 1055   SLP Stop Time  1150   SLP Time Calculation (min) 55 min   Activity Tolerance Patient tolerated treatment well      No past medical history on file.  No past surgical history on file.  There were no vitals filed for this visit.      Subjective Assessment - 10/17/17 1200    Subjective "We forgot the I pad" - spouse   Patient is accompained by: Family member  spouse, Matthew OppenheimKitty   Currently in Pain? No/denies               ADULT SLP TREATMENT - 10/17/17 1201      General Information   Behavior/Cognition Alert;Pleasant mood;Cooperative;Requires cueing     Treatment Provided   Treatment provided Cognitive-Linquistic     Pain Assessment   Pain Assessment No/denies pain     Cognitive-Linquistic Treatment   Treatment focused on Aphasia;Patient/family/caregiver education   Skilled Treatment Confrontation naming with picture paired with written word with frequent semantic cues 75%. Auditory comprehension facilitated having pt ID picture of simple object f:4 with frequent semantic cues 75%. Reading aloud words on card with 60% accuracy and occasional semantic cues. Simple conversation re: pt's family is improved - pt using longer utterances, with improve awareness of errors. Conversatoin required frequent questioning cues, choice of 2, written cues for pt to communicate details about his chidlren and grandchildren. Significant anomia with family names. Pt wrote his spouse's name, son's name and daughter's  name with verbal cues, written cues with blanks for pt to fill in and copying.  Continue to train in [facility] - completed Spoken Word Comprehension, modeling and training semantic cues for wife to imcrease pt success. Auditory comprehension in context of conversation is improved - judged to be 75-80% accurate.     Assessment / Recommendations / Plan   Plan Continue with current plan of care     Progression Toward Goals   Progression toward goals Progressing toward goals          SLP Education - 10/17/17 1209    Education provided Yes   Education Details Cueing during [facility] to improve pt success and facilitate language   Person(s) Educated Patient;Spouse   Methods Explanation;Demonstration;Verbal cues   Comprehension Verbalized understanding;Returned demonstration;Verbal cues required;Need further instruction          SLP Short Term Goals - 10/17/17 1211      SLP SHORT TERM GOAL #1   Title Pt will ID object to spoken word f:3 8/10x with occasional min gestural cues.   Time 4   Period Weeks   Status On-going     SLP SHORT TERM GOAL #2   Title Pt will ID object to written word f:4 with occasional min A 8/10x   Time 4   Period Weeks   Status On-going     SLP SHORT TERM GOAL #3   Title Pt will name basic objects with sentence completion cues 8/10x   Time 4   Period Weeks  Status On-going     SLP SHORT TERM GOAL #4   Title Pt will write biographical information/family names with occasional mod A 4/5x   Time 4   Period Weeks   Status On-going     SLP SHORT TERM GOAL #5   Title Spouse will utilize gestures and written word cues to communicate with pt in simple conversation of 3-4 turns with occasional min A.   Time 4   Period Weeks   Status On-going          SLP Long Term Goals - 10/17/17 1211      SLP LONG TERM GOAL #1   Title Pt will ID object to spoken phrase/description f:3 8/10x   Time 12   Period Weeks   Status On-going     SLP LONG  TERM GOAL #2   Title Pt will ID object to written phrase f:4 8/10x and occasional min A   Time 12   Period Weeks   Status On-going     SLP LONG TERM GOAL #3   Title Pt will name 5 items in a simple category with occasional min A.   Time 12   Period Weeks   Status On-going     SLP LONG TERM GOAL #4   Title Pt will write 1-2 words in sentence completion task 8/10x with occasional min A   Time 12   Period Weeks   Status On-going     SLP LONG TERM GOAL #5   Title Pt/spouse will utilize multimodal communication for simple conversation of 4-5 turns with occasional min A over 2 sessions.    Time 12   Period Weeks   Status On-going          Plan - 10/17/17 1210    Clinical Impression Statement Pt continues with severe non fluent aphasia. Error awareness 2x today spontaneously, otherwise he required max A. Auditory and readin comprehension at word level require frequent gestural and semantic cues. Confrontation naming reqiured frequent sentence/phrase (cloze) completion cues. Spontaneous greetings with minimal aphasic errors. Introduced pt and spouse to [facility] for home practice.  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 Continue skilled ST to maximize expression and comprehension for basic wants/needs and social communication.    Speech Therapy Frequency 3x / week   Treatment/Interventions Internal/external aids;Compensatory strategies;Environmental controls;Compensatory techniques;Language facilitation;Cueing hierarchy;Multimodal communcation approach;Functional tasks;Patient/family education;SLP instruction and feedback;Cognitive reorganization   Potential to Achieve Goals Good   Potential Considerations Severity of impairments   Consulted and Agree with Plan of Care Patient;Family member/caregiver   Family Member Consulted spouse [name]      Patient will benefit from skilled therapeutic intervention in order to improve the following  deficits and impairments:   Aphasia    Problem List There are no active problems to display for this patient.   Winner Valeriano, Radene Journey MS, CCC-SLP 10/17/2017, 12:12 PM  [facility] [address] [place], [place], [address] Phone: [phone]   Fax:  [phone]   Name: Lathon Adan MRN: [id number] Date of Birth: April 02, 1951

## 2017-10-17 NOTE — Patient Instructions (Signed)
  Bring in car magazines and catalogs  Put list of family names in the folder

## 2017-10-22 ENCOUNTER — Encounter: Payer: Self-pay | Admitting: Occupational Therapy

## 2017-10-22 ENCOUNTER — Ambulatory Visit: Payer: Medicare Other | Admitting: Occupational Therapy

## 2017-10-22 ENCOUNTER — Ambulatory Visit: Payer: Medicare Other

## 2017-10-22 DIAGNOSIS — I69318 Other symptoms and signs involving cognitive functions following cerebral infarction: Secondary | ICD-10-CM

## 2017-10-22 DIAGNOSIS — M6281 Muscle weakness (generalized): Secondary | ICD-10-CM

## 2017-10-22 DIAGNOSIS — R4701 Aphasia: Secondary | ICD-10-CM | POA: Diagnosis not present

## 2017-10-22 DIAGNOSIS — I69351 Hemiplegia and hemiparesis following cerebral infarction affecting right dominant side: Secondary | ICD-10-CM

## 2017-10-22 DIAGNOSIS — R278 Other lack of coordination: Secondary | ICD-10-CM

## 2017-10-22 DIAGNOSIS — R29818 Other symptoms and signs involving the nervous system: Secondary | ICD-10-CM

## 2017-10-22 DIAGNOSIS — R482 Apraxia: Secondary | ICD-10-CM

## 2017-10-22 NOTE — Therapy (Signed)
[facility] [address] [place], [place], [address] Phone: [phone]   Fax:  [phone]  Speech Language Pathology Treatment  Patient Details  Name: [name] MRN: [id number] Date of Birth: 1951-03-19 Referring Provider: Dr. Lorretta Harp  Encounter Date: 10/22/2017      End of Session - 10/22/17 1728    Visit Number 5   Number of Visits 24   Date for SLP Re-Evaluation 12/27/17   SLP Start Time 0850   SLP Stop Time  0932   SLP Time Calculation (min) 42 min   Activity Tolerance Patient tolerated treatment well      No past medical history on file.  No past surgical history on file.  There were no vitals filed for this visit.      Subjective Assessment - 10/22/17 0851    Patient is accompained by: Family member  [name], wife   Currently in Pain? No/denies               ADULT SLP TREATMENT - 10/22/17 0852      General Information   Behavior/Cognition Alert;Pleasant mood;Cooperative;Requires cueing   Patient Positioning Upright in chair     Treatment Provided   Treatment provided Cognitive-Linquistic     Cognitive-Linquistic Treatment   Treatment focused on Aphasia;Patient/family/caregiver education   Skilled Treatment Auditory comprehension therapy focused on pt ID'ing common item with name by SLP- 100% success. Pt then ID'd pictures of those objects when given a f:4 words - pt 5/11 success, and req'd mod-max A for naming the same items, heavy perseveration noted, along with semantic paraphasias, mostly. SLP highlighted cues pt's wife could use at home when pt and she practice. SLP trained pt/wife with [facility] setup, suggested wife call Ivemarie (contact at [facility] - [place]) to request another 15 day trial as pt has not completed anything with CT.     Assessment / Recommendations / Plan   Plan Continue with current plan of care     Progression Toward Goals   Progression  toward goals Progressing toward goals          SLP Education - 10/22/17 1727    Education provided Yes   Education Details cueing strategies for home practice for ST   Person(s) Educated Patient;Spouse   Methods Explanation;Demonstration   Comprehension Verbalized understanding          SLP Short Term Goals - 10/22/17 1730      SLP SHORT TERM GOAL #1   Title Pt will ID object to spoken word f:3 8/10x with occasional min gestural cues.   Time 3   Period Weeks   Status On-going     SLP SHORT TERM GOAL #2   Title Pt will ID object to written word f:4 with occasional min A 8/10x   Time 3   Period Weeks   Status On-going     SLP SHORT TERM GOAL #3   Title Pt will name basic objects with sentence completion cues 8/10x   Time 3   Period Weeks   Status On-going     SLP SHORT TERM GOAL #4   Title Pt will write biographical information/family names with occasional mod A 4/5x   Time 3   Period Weeks   Status On-going     SLP SHORT TERM GOAL #5   Title Spouse will utilize gestures and written word cues to communicate with pt in simple conversation of 3-4 turns with occasional min A.   Time  3   Period Weeks   Status On-going          SLP Long Term Goals - 10/22/17 1730      SLP LONG TERM GOAL #1   Title Pt will ID object to spoken phrase/description f:3 8/10x   Time 11   Period Weeks   Status On-going     SLP LONG TERM GOAL #2   Title Pt will ID object to written phrase f:4 8/10x and occasional min A   Time 11   Period Weeks   Status On-going     SLP LONG TERM GOAL #3   Title Pt will name 5 items in a simple category with occasional min A.   Time 11   Period Weeks   Status On-going     SLP LONG TERM GOAL #4   Title Pt will write 1-2 words in sentence completion task 8/10x with occasional min A   Time 11   Period Weeks   Status On-going     SLP LONG TERM GOAL #5   Title Pt/spouse will utilize multimodal communication for simple conversation of 4-5  turns with occasional min A over 2 sessions.    Time 11   Period Weeks   Status On-going          Plan - 10/22/17 1728    Clinical Impression Statement Pt continues with severe non fluent aphasia. Error awareness improved since last time this SLP saw pt. Reading comprehension at word level required gestural and semantic cues. Pt named items with mod-max A usually. Continue skilled ST to maximize expression and comprehension for basic wants/needs and social communication.    Speech Therapy Frequency 3x / week   Duration --  12 weeks   Treatment/Interventions Internal/external aids;Compensatory strategies;Environmental controls;Compensatory techniques;Language facilitation;Cueing hierarchy;Multimodal communcation approach;Functional tasks;Patient/family education;SLP instruction and feedback;Cognitive reorganization   Potential to Achieve Goals Good   Potential Considerations Severity of impairments   Consulted and Agree with Plan of Care Patient;Family member/caregiver   Family Member Consulted spouse Matthew OppenheimKitty      Patient will benefit from skilled therapeutic intervention in order to improve the following deficits and impairments:   Aphasia    Problem List There are no active problems to display for this patient.   [facility] CenterCHINKE,[name] ,MS, CCC-SLP  10/22/2017, 5:32 PM  [facility] [address] [place], [place], [address] Phone: [phone]   Fax:  [phone]   Name: [name] MRN: [id number] Date of Birth: 08/22/1951

## 2017-10-22 NOTE — Patient Instructions (Signed)
[facility] - phone number was provided by SLP on paper, to wife.

## 2017-10-22 NOTE — Therapy (Signed)
[facility] [address] [place], [place], [address] Phone: [phone]   Fax:  [phone]  Occupational Therapy Treatment  Patient Details  Name: [name] MRN: [id number] Date of Birth: [birth year] Referring Provider: L MCA CVA  Encounter Date: 10/22/2017      OT End of Session - 10/22/17 1155    Visit Number 4   Number of Visits 16   Date for OT Re-Evaluation 11/28/17   Authorization Type medicare will need PN and G code every 10th visit   Authorization Time Period 60 days   Authorization - Visit Number 4   Authorization - Number of Visits 10   OT Start Time 0805  pt arrived late   OT Stop Time 0844   OT Time Calculation (min) 39 min   Activity Tolerance Patient tolerated treatment well      History reviewed. No pertinent past medical history.  History reviewed. No pertinent surgical history.  There were no vitals filed for this visit.      Subjective Assessment - 10/22/17 0807    Subjective  "I don't like to much."  When asked if he is using his R hand to eat.    Patient is accompained by: Family member  wife   Pertinent History s/p L CVA   Patient Stated Goals Pt globally aphasic   Currently in Pain? No/denies                      OT Treatments/Exercises (OP) - 10/22/17 0001      ADLs   ADL Comments Pt and wife report that the pt is does not like to use his R hand for eating or other activities.  Pt's wife states they eat out alot (1-2 times per day) and she feels pt is embarrassed to use his R hand in public. Discussed with pt eating at least breakfast with R hand.  Also discussed that pt has the right to choose if he wishes to use his L hand because it is easier or if he wants to try and use his R hand. Pt able to state he wishes for R hand to get better - stressed importance of using his R hand as often as possible even if it is frustrating. Pt and wife verbalized understanding.        Neurological Re-education Exercises   Other Exercises 1 Utlized constraint induced approach during therapy to focus on forced use of RUE as pt is reluctant to use R hand per pt and wife.  Pt able to communicate that he doesn' t like to make mistakes - discussed that there no "mistakes" that the brain is just problem solving. Pt verbalized understanding.  Focused on unilateral fine motor and functional tasks with R hand while  L hand was restrained. Pt requires mod cues to use vision to compensate and to use hand to manipulate objects (pt often wants to use edge of table to turn objects appropriately).  Pt's performance improves with repetition and able to gradually reduce size of objects over course of session.  Discussed pt and wife use of this approach at home - recommeneded that they start with meals eaten at home and when pt is doing HEP and then increase time that pt wears restraint up to a 2 hour block a few times per day as pt tolerates. Pt and wife in agreement.  OT Education - 10/22/17 1152    Education Details use of modified constraint induced approach at home   Person(s) Educated Patient;Spouse   Methods Explanation;Demonstration;Verbal cues   Comprehension Verbalized understanding;Returned demonstration          OT Short Term Goals - 10/22/17 1153      OT SHORT TERM GOAL #1   Title Pt and wife will be mod I with HEP - 10/31/2017   Status On-going     OT SHORT TERM GOAL #2   Title Pt will demonstrate improved grip strength by 5 pounds to assist with functional tasks (baseline=45)   Status On-going     OT SHORT TERM GOAL #3   Title Pt will be supervision to shave with electric razor   Status On-going     OT SHORT TERM GOAL #4   Title Pt will be min a for self feeding   Status On-going     OT SHORT TERM GOAL #5   Title Pt will be  mod I with bathing at shower level   Status On-going     OT SHORT TERM GOAL #6   Title Pt will demonstrate  ability to use RUE as dominant for basic ADL tasks 25% of the time with cueing   Status On-going           OT Long Term Goals - 10/22/17 1153      OT LONG TERM GOAL #1   Title Pt and wife will be mod I with upgraded HEP - 11/28/2017   Status On-going     OT LONG TERM GOAL #2   Title Pt will demonstrate improved grip strength by 7 pounds to assist wtih functional tasks (baseline = 45)   Status On-going     OT LONG TERM GOAL #3   Title Pt will be mod I with eating   Status On-going     OT LONG TERM GOAL #4   Title Pt will be  mod I with dresssing.   Status On-going     OT LONG TERM GOAL #5   Title Pt will be mod I with shower transfers   Status On-going     OT LONG TERM GOAL #6   Title Pt will be mod I with toilet transfers/toileting   Status On-going     OT LONG TERM GOAL #7   Title Pt will use RUE as dominant at least 50% of the time during ADL activities   Status On-going     OT LONG TERM GOAL #8   Title Pt will be able to copy name with 100% legibility   Status On-going               Plan - 10/22/17 1153    Clinical Impression Statement Pt with slow progress toward goals. Pt is reluctant to use R hand therefore slow to reach goals.    Rehab Potential Good   OT Frequency 2x / week   OT Duration 8 weeks   OT Treatment/Interventions Self-care/ADL training;Electrical Stimulation;DME and/or AE instruction;Neuromuscular education;Therapeutic exercise;Therapeutic activities;Patient/family education;Cognitive remediation/compensation   Plan Start checking STG's, NMR for functional use of RUE, constraint induced activities, strategies for ADL's prn, incoporate cognition.   Consulted and Agree with Plan of Care Patient;Family member/caregiver   Family Member Consulted wife [name]      Patient will benefit from skilled therapeutic intervention in order to improve the following deficits and impairments:  Decreased activity tolerance, Decreased coordination,  Decreased cognition, Decreased knowledge of  use of DME, Decreased strength, Impaired UE functional use, Impaired sensation  Visit Diagnosis: Apraxia  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   Norton Pastelulaski, Chrishawna Farina Halliday , OTR/L 10/22/2017, 12:11 PM  [facility] [address] [place], [place], [address] Phone: [phone]   Fax:  [phone]  Name: Matthew FerdinandDennis Rojas MRN: [id number] Date of Birth: 06/30/1951

## 2017-10-23 ENCOUNTER — Encounter: Payer: Self-pay | Admitting: Occupational Therapy

## 2017-10-23 ENCOUNTER — Ambulatory Visit: Payer: Medicare Other | Admitting: Occupational Therapy

## 2017-10-23 DIAGNOSIS — R482 Apraxia: Secondary | ICD-10-CM

## 2017-10-23 DIAGNOSIS — R29818 Other symptoms and signs involving the nervous system: Secondary | ICD-10-CM

## 2017-10-23 DIAGNOSIS — M6281 Muscle weakness (generalized): Secondary | ICD-10-CM

## 2017-10-23 DIAGNOSIS — I69351 Hemiplegia and hemiparesis following cerebral infarction affecting right dominant side: Secondary | ICD-10-CM

## 2017-10-23 DIAGNOSIS — I69318 Other symptoms and signs involving cognitive functions following cerebral infarction: Secondary | ICD-10-CM

## 2017-10-23 DIAGNOSIS — R278 Other lack of coordination: Secondary | ICD-10-CM

## 2017-10-23 DIAGNOSIS — R4701 Aphasia: Secondary | ICD-10-CM | POA: Diagnosis not present

## 2017-10-23 NOTE — Therapy (Signed)
[facility] [address] [place], [place], [address] Phone: [phone]   Fax:  [phone]  Occupational Therapy Treatment  Patient Details  Name: Matthew FerdinandDennis Rojas MRN: [id number] Date of Birth: 11/26/1951 Referring Provider: L MCA CVA  Encounter Date: 10/23/2017      OT End of Session - 10/23/17 1624    Visit Number 5   Number of Visits 16   Date for OT Re-Evaluation 11/28/17   Authorization Type medicare will need PN and G code every 10th visit   Authorization Time Period 60 days   Authorization - Visit Number 5   Authorization - Number of Visits 10   OT Start Time 1317   OT Stop Time 1400   OT Time Calculation (min) 43 min   Activity Tolerance Patient tolerated treatment well      History reviewed. No pertinent past medical history.  History reviewed. No pertinent surgical history.  There were no vitals filed for this visit.      Subjective Assessment - 10/23/17 1322    Subjective  Wife:  "I wish he could use the phone so he call me if there was a problem."   Patient is accompained by: Family member  wife Matthew OppenheimKitty   Pertinent History s/p L CVA   Patient Stated Goals Pt globally aphasic   Currently in Pain? No/denies                      OT Treatments/Exercises (OP) - 10/23/17 0001      ADLs   ADL Comments Discussed with pt and wife how to structure morning routine to allow pt more independence (wife it cueing patient through entire morning routine). Pt has been sleeping in guest room and using guest bathroom.  Discussed importance of having routine be as close to before the stroke as possible due to apraxia and cognitive deficits. Pt to move back into own room and to use own bathroom. Also discussed use of visual cues (i.e leaving washcloth and soap on counter to wash face, jeans on chair in bedroom, etc) as well as use of questioning cues vs directive cues.  Wife and pt both verbalized  understanding.  Pt given homework of washing and waxing wife's car, using R hand to eat consistently, and working on intiation with morning routine.       Neurological Re-education Exercises   Other Exercises 1 Neuro re ed to address functional use of RUE in bilateral tasks (folding laundry, folding pages and putting in envelopes, etc). Pt requires mod cues to use RUE as dominant for these tasks; use of RUE is impacted by apraxia however performance improves with rote repetition.  Pt demonstrates some improvement in attention to RUE and functional use.                 OT Education - 10/22/17 1152    Education Details use of modified constraint induced approach at home   Person(s) Educated Patient;Spouse   Methods Explanation;Demonstration;Verbal cues   Comprehension Verbalized understanding;Returned demonstration          OT Short Term Goals - 10/23/17 1621      OT SHORT TERM GOAL #1   Title Pt and wife will be mod I with HEP - 10/31/2017   Status Achieved     OT SHORT TERM GOAL #2   Title Pt will demonstrate improved grip strength by 5 pounds to assist with functional tasks (baseline=45)   Status On-going  OT SHORT TERM GOAL #3   Title Pt will be supervision to shave with electric razor   Status On-going     OT SHORT TERM GOAL #4   Title Pt will be min a for self feeding   Status On-going     OT SHORT TERM GOAL #5   Title Pt will be  mod I with bathing at shower level   Status On-going     OT SHORT TERM GOAL #6   Title Pt will demonstrate ability to use RUE as dominant for basic ADL tasks 25% of the time with cueing   Status Achieved           OT Long Term Goals - 10/23/17 1622      OT LONG TERM GOAL #1   Title Pt and wife will be mod I with upgraded HEP - 11/28/2017   Status On-going     OT LONG TERM GOAL #2   Title Pt will demonstrate improved grip strength by 7 pounds to assist wtih functional tasks (baseline = 45)   Status On-going     OT LONG  TERM GOAL #3   Title Pt will be mod I with eating   Status On-going     OT LONG TERM GOAL #4   Title Pt will be  mod I with dresssing.   Status On-going     OT LONG TERM GOAL #5   Title Pt will be mod I with shower transfers   Status On-going     OT LONG TERM GOAL #6   Title Pt will be mod I with toilet transfers/toileting   Status On-going     OT LONG TERM GOAL #7   Title Pt will use RUE as dominant at least 50% of the time during ADL activities   Status On-going     OT LONG TERM GOAL #8   Title Pt will be able to copy name with 100% legibility   Status On-going               Plan - 10/23/17 1622    Clinical Impression Statement Pt with good progress today toward goals.  Discussed with wife and pt giving familiar bilateral tasks to pt to encourage use of RUE.    Rehab Potential Good   OT Frequency 2x / week   OT Duration 8 weeks   OT Treatment/Interventions Self-care/ADL training;Electrical Stimulation;DME and/or AE instruction;Neuromuscular education;Therapeutic exercise;Therapeutic activities;Patient/family education;Cognitive remediation/compensation   Plan Check remaining STG's, NMR for functional use of RUE, constraint induced and bilateral activities, strategies for ADL's prn, incorporate cognition. Check homework activities   Consulted and Agree with Plan of Care Patient;Family member/caregiver   Family Member Consulted wife [name]      Patient will benefit from skilled therapeutic intervention in order to improve the following deficits and impairments:  Decreased activity tolerance, Decreased coordination, Decreased cognition, Decreased knowledge of use of DME, Decreased strength, Impaired UE functional use, Impaired sensation  Visit Diagnosis: Apraxia  Hemiplegia and hemiparesis following cerebral infarction affecting right dominant side (HCC)  Other lack of coordination  Other symptoms and signs involving cognitive functions following cerebral  infarction  Other symptoms and signs involving the nervous system  Muscle weakness (generalized)    Problem List There are no active problems to display for this patient.   Norton Pastel, OTR/L 10/23/2017, 4:25 PM  [facility] [address] [place], [place], [address] Phone: [phone]   Fax:  [phone]  Name: Filimon Miranda MRN: [id number] Date of Birth: 1951/04/18

## 2017-10-24 ENCOUNTER — Ambulatory Visit: Payer: Medicare Other | Attending: Physical Medicine and Rehabilitation | Admitting: Speech Pathology

## 2017-10-24 DIAGNOSIS — R482 Apraxia: Secondary | ICD-10-CM | POA: Diagnosis present

## 2017-10-24 DIAGNOSIS — R29818 Other symptoms and signs involving the nervous system: Secondary | ICD-10-CM | POA: Insufficient documentation

## 2017-10-24 DIAGNOSIS — I69318 Other symptoms and signs involving cognitive functions following cerebral infarction: Secondary | ICD-10-CM | POA: Diagnosis present

## 2017-10-24 DIAGNOSIS — R278 Other lack of coordination: Secondary | ICD-10-CM | POA: Insufficient documentation

## 2017-10-24 DIAGNOSIS — M6281 Muscle weakness (generalized): Secondary | ICD-10-CM | POA: Diagnosis present

## 2017-10-24 DIAGNOSIS — R4701 Aphasia: Secondary | ICD-10-CM | POA: Diagnosis present

## 2017-10-24 DIAGNOSIS — I69351 Hemiplegia and hemiparesis following cerebral infarction affecting right dominant side: Secondary | ICD-10-CM | POA: Insufficient documentation

## 2017-10-24 NOTE — Therapy (Signed)
[facility] [address] [place], [place], [address] Phone: [phone]   Fax:  [phone]  Speech Language Pathology Treatment  Patient Details  Name: Jadin Creque MRN: [id number] Date of Birth: [birth year] Referring Provider: Dr. Lorretta Harp  Encounter Date: 10/24/2017      End of Session - 10/24/17 1627    Visit Number 6   Number of Visits 24   Date for SLP Re-Evaluation 12/27/17   SLP Start Time 1410   SLP Stop Time  1500   SLP Time Calculation (min) 50 min   Activity Tolerance Patient tolerated treatment well      No past medical history on file.  No past surgical history on file.  There were no vitals filed for this visit.      Subjective Assessment - 10/24/17 1617    Subjective "We got cards and have been working a little - he gets frustrated and doesn't want to do the homework"   Patient is accompained by: Family member   Currently in Pain? No/denies               ADULT SLP TREATMENT - 10/24/17 1618      General Information   Behavior/Cognition Alert;Pleasant mood;Cooperative;Requires cueing     Treatment Provided   Treatment provided Cognitive-Linquistic     Cognitive-Linquistic Treatment   Treatment focused on Aphasia;Patient/family/caregiver education   Skilled Treatment Facilitated reading comprehension at word level f:3 with mod A (gestures, semantic cues) 75% accuracy (tools,). Auditory comprehension at word level f:4 (fruits) 75% with usual mod A; written expression address and word (tools) with copy cues. Naming basic objects/pictures with 75% accuracy with cloze cues. Simple conversation /auditory comprehesion re: Halloween with frequent questioning cues. Pt brought in his Ipad - we intiated training in Constant Therapy on his Ipad     Assessment / Recommendations / Plan   Plan Continue with current plan of care     Progression Toward Goals   Progression toward goals  Progressing toward goals            SLP Short Term Goals - 10/24/17 1627      SLP SHORT TERM GOAL #1   Title Pt will ID object to spoken word f:3 8/10x with occasional min gestural cues.   Time 3   Period Weeks   Status On-going     SLP SHORT TERM GOAL #2   Title Pt will ID object to written word f:4 with occasional min A 8/10x   Time 3   Period Weeks   Status On-going     SLP SHORT TERM GOAL #3   Title Pt will name basic objects with sentence completion cues 8/10x   Time 3   Period Weeks   Status On-going     SLP SHORT TERM GOAL #4   Title Pt will write biographical information/family names with occasional mod A 4/5x   Time 3   Period Weeks   Status On-going     SLP SHORT TERM GOAL #5   Title Spouse will utilize gestures and written word cues to communicate with pt in simple conversation of 3-4 turns with occasional min A.   Time 3   Period Weeks   Status On-going          SLP Long Term Goals - 10/24/17 1627      SLP LONG TERM GOAL #1   Title Pt will ID object to spoken phrase/description f:3 8/10x   Time 11  Period Weeks   Status On-going     SLP LONG TERM GOAL #2   Title Pt will ID object to written phrase f:4 8/10x and occasional min A   Time 11   Period Weeks   Status On-going     SLP LONG TERM GOAL #3   Title Pt will name 5 items in a simple category with occasional min A.   Time 11   Period Weeks   Status On-going     SLP LONG TERM GOAL #4   Title Pt will write 1-2 words in sentence completion task 8/10x with occasional min A   Time 11   Period Weeks   Status On-going     SLP LONG TERM GOAL #5   Title Pt/spouse will utilize multimodal communication for simple conversation of 4-5 turns with occasional min A over 2 sessions.    Time 11   Period Weeks   Status On-going        Patient will benefit from skilled therapeutic intervention in order to improve the following deficits and impairments:   Aphasia    Problem List There  are no active problems to display for this patient.   Yaresly Menzel, Radene JourneyLaura Ann MS, CCC-SLP 10/24/2017, 4:28 PM  [facility] [address] [place], [place], [address] Phone: [phone]   Fax:  [phone]   Name: Wandalee FerdinandDennis Mathers MRN: [id number] Date of Birth: 12/19/1951

## 2017-10-25 ENCOUNTER — Ambulatory Visit: Payer: Medicare Other

## 2017-10-25 DIAGNOSIS — R4701 Aphasia: Secondary | ICD-10-CM

## 2017-10-25 DIAGNOSIS — R482 Apraxia: Secondary | ICD-10-CM

## 2017-10-25 NOTE — Patient Instructions (Signed)
  Please complete the assigned speech therapy homework prior to your next session and return it to the speech therapist at your next visit.  

## 2017-10-25 NOTE — Therapy (Signed)
[facility] [address] [place], [place], [address] Phone: [phone]   Fax:  [phone]  Speech Language Pathology Treatment  Patient Details  Name: [name] MRN: [id number] Date of Birth: 11/02/1951 Referring Provider: Dr. Lorretta HarpJairon Downs  Encounter Date: 10/25/2017      End of Session - 10/25/17 1527    Visit Number 7   Number of Visits 24   Date for SLP Re-Evaluation 12/27/17   SLP Start Time 1150   SLP Stop Time  1233   SLP Time Calculation (min) 43 min   Activity Tolerance Patient tolerated treatment well      No past medical history on file.  No past surgical history on file.  There were no vitals filed for this visit.      Subjective Assessment - 10/25/17 1154    Subjective "I hope it's - - better than what it was yesterday."   Currently in Pain? No/denies               ADULT SLP TREATMENT - 10/25/17 1154      General Information   Behavior/Cognition Alert;Pleasant mood;Cooperative;Requires cueing     Treatment Provided   Treatment provided Cognitive-Linquistic     Cognitive-Linquistic Treatment   Treatment focused on Aphasia;Patient/family/caregiver education   Skilled Treatment "He ordered his own breakfast at [employer]." SLP faciliated pt's expressive language by idiom/expression completion.  Pt completed idioms/expressions 82% with independence, improved to 90% with mod-max A. Pt told one thing in pictures with heavy perseveration, 33% success with mod A. Opposites were generated with needed mod-max cues from SLP occasionslly. Pt wife expressed interest in possibly transferring therapies to [facility] at sometime in future.     Assessment / Recommendations / Plan   Plan Continue with current plan of care     Progression Toward Goals   Progression toward goals Progressing toward goals            SLP Short Term Goals - 10/25/17 1527      SLP SHORT TERM GOAL #1   Title  Pt will ID object to spoken word f:3 8/10x with occasional min gestural cues.   Time 3   Period Weeks   Status On-going     SLP SHORT TERM GOAL #2   Title Pt will ID object to written word f:4 with occasional min A 8/10x   Time 3   Period Weeks   Status On-going     SLP SHORT TERM GOAL #3   Title Pt will name basic objects with sentence completion cues 8/10x   Time 3   Period Weeks   Status On-going     SLP SHORT TERM GOAL #4   Title Pt will write biographical information/family names with occasional mod A 4/5x   Time 3   Period Weeks   Status On-going     SLP SHORT TERM GOAL #5   Title Spouse will utilize gestures and written word cues to communicate with pt in simple conversation of 3-4 turns with occasional min A.   Time 3   Period Weeks   Status On-going          SLP Long Term Goals - 10/25/17 1527      SLP LONG TERM GOAL #1   Title Pt will ID object to spoken phrase/description f:3 8/10x   Time 11   Period Weeks   Status On-going     SLP LONG TERM GOAL #2   Title Pt will  ID object to written phrase f:4 8/10x and occasional min A   Time 11   Period Weeks   Status On-going     SLP LONG TERM GOAL #3   Title Pt will name 5 items in a simple category with occasional min A.   Time 11   Period Weeks   Status On-going     SLP LONG TERM GOAL #4   Title Pt will write 1-2 words in sentence completion task 8/10x with occasional min A   Time 11   Period Weeks   Status On-going     SLP LONG TERM GOAL #5   Title Pt/spouse will utilize multimodal communication for simple conversation of 4-5 turns with occasional min A over 2 sessions.    Time 11   Period Weeks   Status On-going          Plan - 10/25/17 1527    Clinical Impression Statement Pt continues with severe non fluent aphasia. Error awareness improved since last time this SLP saw pt. Reading comprehension at word level required gestural and semantic cues. Pt named items with mod-max A usually.  Continue skilled ST to maximize expression and comprehension for basic wants/needs and social communication.    Speech Therapy Frequency 3x / week   Duration --  12 weeks   Treatment/Interventions Internal/external aids;Compensatory strategies;Environmental controls;Compensatory techniques;Language facilitation;Cueing hierarchy;Multimodal communcation approach;Functional tasks;Patient/family education;SLP instruction and feedback;Cognitive reorganization   Potential to Achieve Goals Good   Potential Considerations Severity of impairments   Consulted and Agree with Plan of Care Patient;Family member/caregiver   Family Member Consulted spouse [name]      Patient will benefit from skilled therapeutic intervention in order to improve the following deficits and impairments:   Aphasia  Apraxia    Problem List There are no active problems to display for this patient.   [facility] ,MS, CCC-SLP  10/25/2017, 3:28 PM  [facility] [address] [place], [place], [address] Phone: [phone]   Fax:  [phone]   Name: [name] MRN: [id number] Date of Birth: 1951-06-22

## 2017-10-29 ENCOUNTER — Encounter: Payer: Self-pay | Admitting: Speech Pathology

## 2017-10-29 ENCOUNTER — Ambulatory Visit: Payer: Medicare Other | Admitting: Speech Pathology

## 2017-10-29 DIAGNOSIS — R4701 Aphasia: Secondary | ICD-10-CM | POA: Diagnosis not present

## 2017-10-29 NOTE — Therapy (Signed)
[facility] [address] [place], [place], [address] Phone: [phone]   Fax:  [phone]  Speech Language Pathology Treatment  Patient Details  Name: [name] MRN: [id number] Date of Birth: 03/18/1951 Referring Provider: Dr. Lorretta HarpJairon Downs   Encounter Date: 10/29/2017  End of Session - 10/29/17 1215    Visit Number  8    Number of Visits  24    Date for SLP Re-Evaluation  12/27/17    SLP Start Time  1100    SLP Stop Time   1150    SLP Time Calculation (min)  50 min    Activity Tolerance  Patient tolerated treatment well       History reviewed. No pertinent past medical history.  History reviewed. No pertinent surgical history.  There were no vitals filed for this visit.  Subjective Assessment - 10/29/17 1158    Subjective  "He is having trouble telling me how to fix the gas and hot water heater"    Patient is accompained by:  Family member    Currently in Pain?  No/denies            ADULT SLP TREATMENT - 10/29/17 1200      General Information   Behavior/Cognition  Alert;Pleasant mood;Cooperative;Requires cueing      Treatment Provided   Treatment provided  Cognitive-Linquistic      Cognitive-Linquistic Treatment   Treatment focused on  Aphasia;Patient/family/caregiver education    Skilled Treatment  Facilitated verbal expression/naming verbalizing what it happening or objects in situatiuon pictures with frequent written and cloze cues - facilitated simple conversation with situation cards with max A. Pt did have some spontaneous sentences without content words (IE: That's bad re: picture of baby with knife). Confrontation naming with frequent sentence completion cues 85%. Semantic and phonemic paraphasia with 60% awareness. Written expresion of family names with copying cues. Modeled for spouse written and semantic and cloze cues for confrontation practice at home.       Assessment /  Recommendations / Plan   Plan  Continue with current plan of care      Progression Toward Goals   Progression toward goals  Progressing toward goals       SLP Education - 10/29/17 1208    Education provided  Yes    Education Details  cueing for home practice, make book with family photos    Person(s) Educated  Patient;Spouse    Methods  Explanation;Demonstration    Comprehension  Verbalized understanding       SLP Short Term Goals - 10/29/17 1213      SLP SHORT TERM GOAL #1   Title  Pt will ID object to spoken word f:3 8/10x with occasional min gestural cues.    Time  2    Period  Weeks    Status  On-going      SLP SHORT TERM GOAL #2   Title  Pt will ID object to written word f:4 with occasional min A 8/10x    Time  2    Period  Weeks    Status  On-going      SLP SHORT TERM GOAL #3   Title  Pt will name basic objects with sentence completion cues 8/10x    Time  2    Period  Weeks    Status  On-going      SLP SHORT TERM GOAL #4   Title  Pt will write biographical information/family names with occasional mod A  4/5x    Time  2    Period  Weeks    Status  On-going      SLP SHORT TERM GOAL #5   Title  Spouse will utilize gestures and written word cues to communicate with pt in simple conversation of 3-4 turns with occasional min A.    Time  2    Period  Weeks    Status  On-going       SLP Long Term Goals - 10/29/17 1214      SLP LONG TERM GOAL #1   Title  Pt will ID object to spoken phrase/description f:3 8/10x    Time  10    Period  Weeks    Status  On-going      SLP LONG TERM GOAL #2   Title  Pt will ID object to written phrase f:4 8/10x and occasional min A    Time  10    Period  Weeks    Status  On-going      SLP LONG TERM GOAL #3   Title  Pt will name 5 items in a simple category with occasional min A.    Time  10    Period  Weeks    Status  On-going      SLP LONG TERM GOAL #4   Title  Pt will write 1-2 words in sentence completion task 8/10x  with occasional min A    Time  10    Period  Weeks    Status  On-going      SLP LONG TERM GOAL #5   Title  Pt/spouse will utilize multimodal communication for simple conversation of 4-5 turns with occasional min A over 2 sessions.     Time  10    Period  Weeks    Status  On-going       Plan - 10/29/17 1208    Clinical Impression Statement  Severe non fluent apahsia persists. Error awareness continues to improve. Confrontation naming of basic objects in room with sentence completion (cloze) improving. Written expression continues to require max A at word level. Some spontaneous phrases/sentneces without content words in simple conversation today. Continue skilled ST to maximize communication for independence and to reduce caregiver burden.    Speech Therapy Frequency  3x / week    Treatment/Interventions  Internal/external aids;Compensatory strategies;Environmental controls;Compensatory techniques;Language facilitation;Cueing hierarchy;Multimodal communcation approach;Functional tasks;Patient/family education;SLP instruction and feedback;Cognitive reorganization    Potential to Achieve Goals  Good    Potential Considerations  Severity of impairments    Consulted and Agree with Plan of Care  Patient;Family member/caregiver       Patient will benefit from skilled therapeutic intervention in order to improve the following deficits and impairments:   Aphasia    Problem List There are no active problems to display for this patient.   Lovvorn, Radene JourneyLaura Ann MS, CCC-SLP 10/29/2017, 12:15 PM  [facility] [address] [place], [place], [address] Phone: [phone]   Fax:  [phone]   Name: [name] MRN: [id number] Date of Birth: 07/10/1951

## 2017-10-29 NOTE — Patient Instructions (Signed)
  Make pages with family and friends pictures and write the names below. It would be best if there was only 1 or 2 people in the pictures and they go together. For example siblings, spouses, pets. Print names below.

## 2017-10-31 ENCOUNTER — Encounter: Payer: Self-pay | Admitting: Occupational Therapy

## 2017-10-31 ENCOUNTER — Ambulatory Visit: Payer: Medicare Other | Admitting: Occupational Therapy

## 2017-10-31 DIAGNOSIS — I69318 Other symptoms and signs involving cognitive functions following cerebral infarction: Secondary | ICD-10-CM

## 2017-10-31 DIAGNOSIS — R4701 Aphasia: Secondary | ICD-10-CM | POA: Diagnosis not present

## 2017-10-31 DIAGNOSIS — M6281 Muscle weakness (generalized): Secondary | ICD-10-CM

## 2017-10-31 DIAGNOSIS — R482 Apraxia: Secondary | ICD-10-CM

## 2017-10-31 DIAGNOSIS — R278 Other lack of coordination: Secondary | ICD-10-CM

## 2017-10-31 DIAGNOSIS — I69351 Hemiplegia and hemiparesis following cerebral infarction affecting right dominant side: Secondary | ICD-10-CM

## 2017-10-31 DIAGNOSIS — R29818 Other symptoms and signs involving the nervous system: Secondary | ICD-10-CM

## 2017-10-31 NOTE — Therapy (Signed)
[facility] [address] [place], [place], [address] Phone: [phone]   Fax:  [phone]  Occupational Therapy Treatment  Patient Details  Name: [name] MRN: [id number] Date of Birth: Mar 05, 1951 Referring Provider: L MCA CVA   Encounter Date: 10/31/2017  OT End of Session - 10/31/17 1548    Visit Number  6    Number of Visits  16    Authorization Type  medicare will need PN and G code every 10th visit    Authorization Time Period  60 days    Authorization - Visit Number  6    Authorization - Number of Visits  10    OT Start Time  1316    OT Stop Time  1359    OT Time Calculation (min)  43 min    Activity Tolerance  Patient tolerated treatment well       History reviewed. No pertinent past medical history.  History reviewed. No pertinent surgical history.  There were no vitals filed for this visit.  Subjective Assessment - 10/31/17 1320    Subjective   This is my sister    Patient is accompained by:  Family member sister    Pertinent History  s/p L CVA    Patient Stated Goals  Pt globally aphasic    Currently in Pain?  No/denies                   OT Treatments/Exercises (OP) - 10/31/17 0001      ADLs   ADL Comments  Checked STG's - see goal section for update.  Pt has not met grip strength goal - reviewed again how to do grip strength with putty and how to cue pt with sister.  Sister and pt able to return demonstrate. Explained that pt may need frequent cueing initially due to apraxia.        Neurological Re-education Exercises   Other Exercises 1  Neuro re ed to address unilateral functional use of RUE with simple tools (i.e screw driver and simple small wrench on screws and nuts and bolts). Pt able to complete with increased time. Pt with signficant improvement in ability to use RUE functionally, to pick up and orient tools and utensils in R hand, and to use tools and utensils  appropriately.  Addressed simple coordination with tasks as well.                 OT Short Term Goals - 10/31/17 1545      OT SHORT TERM GOAL #1   Title  Pt and wife will be mod I with HEP - 10/31/2017    Status  Achieved      OT SHORT TERM GOAL #2   Title  Pt will demonstrate improved grip strength by 5 pounds to assist with functional tasks (baseline=45)    Status  On-going      OT SHORT TERM GOAL #3   Title  Pt will be supervision to shave with electric razor    Status  Deferred pt able to communicate that wife and pt do not like electric razor therefore will defer this goal.      OT SHORT TERM GOAL #4   Title  Pt will be min a for self feeding    Status  Achieved      OT SHORT TERM GOAL #5   Title  Pt will be  mod I with bathing at shower level    Status  Achieved      OT SHORT TERM GOAL #6   Title  Pt will demonstrate ability to use RUE as dominant for basic ADL tasks 25% of the time with cueing    Status  Achieved        OT Long Term Goals - 10/31/17 1546      OT LONG TERM GOAL #1   Title  Pt and wife will be mod I with upgraded HEP - 11/28/2017    Status  On-going      OT LONG TERM GOAL #2   Title  Pt will demonstrate improved grip strength by 7 pounds to assist wtih functional tasks (baseline = 45)    Status  On-going      OT LONG TERM GOAL #3   Title  Pt will be mod I with eating    Status  On-going      OT LONG TERM GOAL #4   Title  Pt will be  mod I with dresssing.    Status  On-going      OT LONG TERM GOAL #5   Title  Pt will be mod I with shower transfers    Status  On-going      OT LONG TERM GOAL #6   Title  Pt will be mod I with toilet transfers/toileting    Status  On-going      OT LONG TERM GOAL #7   Title  Pt will use RUE as dominant at least 50% of the time during ADL activities    Status  On-going      OT LONG TERM GOAL #8   Title  Pt will be able to copy name with 100% legibility    Status  On-going            Plan -  10/31/17 1546    Clinical Impression Statement  Pt has met all but one STG and is progressing toward LTG's. Pt using RUE more consistently at home per pt and pt's sister    Rehab Potential  Good    OT Frequency  2x / week    OT Duration  8 weeks    OT Treatment/Interventions  Self-care/ADL training;Electrical Stimulation;DME and/or AE instruction;Neuromuscular education;Therapeutic exercise;Therapeutic activities;Patient/family education;Cognitive remediation/compensation    Plan  NMR for functional use of RUE, constraint induced and bilateral activities, incorporate cognition, coordination, check homework activities.     Consulted and Agree with Plan of Care  Patient;Family member/caregiver    Family Member Consulted  sister       Patient will benefit from skilled therapeutic intervention in order to improve the following deficits and impairments:  Decreased activity tolerance, Decreased coordination, Decreased cognition, Decreased knowledge of use of DME, Decreased strength, Impaired UE functional use, Impaired sensation  Visit Diagnosis: Apraxia  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   Quay Burow, OTR/L 10/31/2017, 3:49 PM  [facility] [address] [place], [place], [address] Phone: [phone]   Fax:  [phone]  Name: [name] MRN: [id number] Date of Birth: December 28, 1950

## 2017-11-01 ENCOUNTER — Encounter: Payer: Self-pay | Admitting: Occupational Therapy

## 2017-11-01 ENCOUNTER — Ambulatory Visit: Payer: Medicare Other

## 2017-11-01 ENCOUNTER — Ambulatory Visit: Payer: Medicare Other | Admitting: Occupational Therapy

## 2017-11-01 DIAGNOSIS — I69351 Hemiplegia and hemiparesis following cerebral infarction affecting right dominant side: Secondary | ICD-10-CM

## 2017-11-01 DIAGNOSIS — R482 Apraxia: Secondary | ICD-10-CM

## 2017-11-01 DIAGNOSIS — R4701 Aphasia: Secondary | ICD-10-CM | POA: Diagnosis not present

## 2017-11-01 DIAGNOSIS — M6281 Muscle weakness (generalized): Secondary | ICD-10-CM

## 2017-11-01 DIAGNOSIS — I69318 Other symptoms and signs involving cognitive functions following cerebral infarction: Secondary | ICD-10-CM

## 2017-11-01 DIAGNOSIS — R29818 Other symptoms and signs involving the nervous system: Secondary | ICD-10-CM

## 2017-11-01 DIAGNOSIS — R278 Other lack of coordination: Secondary | ICD-10-CM

## 2017-11-01 NOTE — Therapy (Signed)
[facility] [address] [place], [place], [address] Phone: [phone]   Fax:  [phone]  Speech Language Pathology Treatment  Patient Details  Name: [name] MRN: [id number] Date of Birth: 04/27/1951 Referring Provider: Dr. Lorretta HarpJairon Downs   Encounter Date: 11/01/2017  End of Session - 11/01/17 1723    Visit Number  9    Number of Visits  24    Date for SLP Re-Evaluation  12/27/17    SLP Start Time  1148    SLP Stop Time   1230    SLP Time Calculation (min)  42 min    Activity Tolerance  Patient tolerated treatment well       History reviewed. No pertinent past medical history.  History reviewed. No pertinent surgical history.  There were no vitals filed for this visit.  Subjective Assessment - 11/01/17 1153    Subjective  "Doing - pretty good."    Patient is accompained by:  Family member wife    Currently in Pain?  No/denies            ADULT SLP TREATMENT - 11/01/17 1153      General Information   Behavior/Cognition  Alert;Pleasant mood;Cooperative;Requires cueing      Treatment Provided   Treatment provided  Cognitive-Linquistic      Cognitive-Linquistic Treatment   Treatment focused on  Aphasia;Patient/family/caregiver education    Skilled Treatment  Pt brought pictures in to talk about today - pt named grandsons with occasional mod A, and describe d apicture with mod-max A usually including written cues which appeared to help pt best. Pt withheavy perseveration with "GT 500" today, for all answers. Awareness of errors approx 70%.      Assessment / Recommendations / Plan   Plan  Continue with current plan of care      Progression Toward Goals   Progression toward goals  Progressing toward goals         SLP Short Term Goals - 11/01/17 1724      SLP SHORT TERM GOAL #1   Title  Pt will ID object to spoken word f:3 8/10x with occasional min gestural cues.    Time  2    Period   Weeks    Status  On-going      SLP SHORT TERM GOAL #2   Title  Pt will ID object to written word f:4 with occasional min A 8/10x    Time  2    Period  Weeks    Status  On-going      SLP SHORT TERM GOAL #3   Title  Pt will name basic objects with sentence completion cues 8/10x    Time  2    Period  Weeks    Status  On-going      SLP SHORT TERM GOAL #4   Title  Pt will write biographical information/family names with occasional mod A 4/5x    Time  2    Period  Weeks    Status  On-going      SLP SHORT TERM GOAL #5   Title  Spouse will utilize gestures and written word cues to communicate with pt in simple conversation of 3-4 turns with occasional min A.    Time  2    Period  Weeks    Status  On-going       SLP Long Term Goals - 11/01/17 1724      SLP LONG TERM GOAL #1  Title  Pt will ID object to spoken phrase/description f:3 8/10x    Time  10    Period  Weeks    Status  On-going      SLP LONG TERM GOAL #2   Title  Pt will ID object to written phrase f:4 8/10x and occasional min A    Time  10    Period  Weeks    Status  On-going      SLP LONG TERM GOAL #3   Title  Pt will name 5 items in a simple category with occasional min A.    Time  10    Period  Weeks    Status  On-going      SLP LONG TERM GOAL #4   Title  Pt will write 1-2 words in sentence completion task 8/10x with occasional min A    Time  10    Period  Weeks    Status  On-going      SLP LONG TERM GOAL #5   Title  Pt/spouse will utilize multimodal communication for simple conversation of 4-5 turns with occasional min A over 2 sessions.     Time  10    Period  Weeks    Status  On-going       Plan - 11/01/17 1723    Clinical Impression Statement  Severe non fluent apahsia persists. Error awareness approx 70% today. Confrontation naming of grandsons with A necessary. Continue skilled ST to maximize communication for independence and to reduce caregiver burden.    Speech Therapy Frequency  3x / week     Treatment/Interventions  Internal/external aids;Compensatory strategies;Environmental controls;Compensatory techniques;Language facilitation;Cueing hierarchy;Multimodal communcation approach;Functional tasks;Patient/family education;SLP instruction and feedback;Cognitive reorganization    Potential to Achieve Goals  Good    Potential Considerations  Severity of impairments    Consulted and Agree with Plan of Care  Patient;Family member/caregiver       Patient will benefit from skilled therapeutic intervention in order to improve the following deficits and impairments:   Apraxia  Aphasia    Problem List There are no active problems to display for this patient.   [facility],Zalmen Wrightsman ,MS, CCC-SLP  11/01/2017, 5:24 PM  [facility] [address] [place], [place], [address] Phone: [phone]   Fax:  [phone]   Name: [name] MRN: [id number] Date of Birth: 06/19/1951

## 2017-11-01 NOTE — Therapy (Signed)
[facility] [address] [place], [place], [address] Phone: [phone]   Fax:  [phone]  Occupational Therapy Treatment  Patient Details  Name: [name] MRN: [id number] Date of Birth: [birth year] Referring Provider: L MCA CVA   Encounter Date: 11/01/2017  OT End of Session - 11/01/17 1552    Visit Number  7    Number of Visits  16    Date for OT Re-Evaluation  11/28/17    Authorization Type  medicare will need PN and G code every 10th visit    Authorization Time Period  60 days    Authorization - Visit Number  7    Authorization - Number of Visits  10    OT Start Time  1103    OT Stop Time  1145    OT Time Calculation (min)  42 min    Activity Tolerance  Patient tolerated treatment well       History reviewed. No pertinent past medical history.  History reviewed. No pertinent surgical history.  There were no vitals filed for this visit.  Subjective Assessment - 11/01/17 1107    Subjective   Sometimes I can't communicate    Patient is accompained by:  Family member wife    Pertinent History  s/p L CVA    Patient Stated Goals  Pt globally aphasic    Currently in Pain?  No/denies                   OT Treatments/Exercises (OP) - 11/01/17 0001      ADLs   ADL Comments  Wife reports that she is concerned that pt was always very active person and now sits and watches TV during the day. Discussed importance of using structured schedule as well as discussed ways to provide supervision for pt to allow pt to begin to participate in past activities such as yard work (blowing leaves, stacking wood, taking care of wood stove, etc). Also discussed strategies to allow pt to engage in working on cars again with friends supervising as well as returning to regular social activities with friends (pt ate out regularly with friends).  Also discussed ways pt could be more involved in church activities as this was  a large part of his life before as well as exercising with walking every day. Pt and wife both open to use of structured schedule which will be developed based on pt/wfie interview today and shared with pt and wife next session. Pt given washing car, playing solitaire on computer with R hand and helping with upcoming stew event at church as home activtiies.  Pt in agreement and wife to enlist help of pt's friends.                OT Short Term Goals - 11/01/17 1543      OT SHORT TERM GOAL #1   Title  Pt and wife will be mod I with HEP - 10/31/2017    Status  Achieved      OT SHORT TERM GOAL #2   Title  Pt will demonstrate improved grip strength by 5 pounds to assist with functional tasks (baseline=45)    Status  On-going      OT SHORT TERM GOAL #3   Title  Pt will be supervision to shave with electric razor    Status  Deferred pt able to communicate that wife and pt do not like electric razor therefore will defer this goal.  OT SHORT TERM GOAL #4   Title  Pt will be min a for self feeding    Status  Achieved      OT SHORT TERM GOAL #5   Title  Pt will be  mod I with bathing at shower level    Status  Achieved      OT SHORT TERM GOAL #6   Title  Pt will demonstrate ability to use RUE as dominant for basic ADL tasks 25% of the time with cueing    Status  Achieved        OT Long Term Goals - 11/01/17 1544      OT LONG TERM GOAL #1   Title  Pt and wife will be mod I with upgraded HEP - 11/28/2017    Status  On-going      OT LONG TERM GOAL #2   Title  Pt will demonstrate improved grip strength by 7 pounds to assist wtih functional tasks (baseline = 45)    Status  On-going      OT LONG TERM GOAL #3   Title  Pt will be mod I with eating    Status  On-going      OT LONG TERM GOAL #4   Title  Pt will be  mod I with dresssing.    Status  On-going      OT LONG TERM GOAL #5   Title  Pt will be mod I with shower transfers    Status  On-going      OT LONG TERM GOAL  #6   Title  Pt will be mod I with toilet transfers/toileting    Status  On-going      OT LONG TERM GOAL #7   Title  Pt will use RUE as dominant at least 50% of the time during ADL activities    Status  On-going      OT LONG TERM GOAL #8   Title  Pt will be able to copy name with 100% legibility    Status  On-going            Plan - 11/01/17 1544    Clinical Impression Statement  Pt progressing toward goals. Pt continues to progress with RUE functional use.    Rehab Potential  Good    OT Frequency  2x / week    OT Duration  8 weeks    OT Treatment/Interventions  Self-care/ADL training;Electrical Stimulation;DME and/or AE instruction;Neuromuscular education;Therapeutic exercise;Therapeutic activities;Patient/family education;Cognitive remediation/compensation    Plan  structured schedule, NMR for functional use of RUE, constraint induced and bilateral activities, incoporate cognition, check homework activities.    Consulted and Agree with Plan of Care  Patient;Family member/caregiver    Family Member Consulted  wife [name]       Patient will benefit from skilled therapeutic intervention in order to improve the following deficits and impairments:  Decreased activity tolerance, Decreased coordination, Decreased cognition, Decreased knowledge of use of DME, Decreased strength, Impaired UE functional use, Impaired sensation  Visit Diagnosis: Apraxia  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   Norton Pastelulaski, Redonna Wilbert Halliday, OTR/L 11/01/2017, 3:56 PM  [facility] [address] [place], [place],  [address] Phone: [phone]   Fax:  [phone]  Name: Matthew FerdinandDennis Raatz MRN: [id number] Date of Birth: 08/24/1951

## 2017-11-04 ENCOUNTER — Ambulatory Visit: Payer: Medicare Other

## 2017-11-04 ENCOUNTER — Ambulatory Visit: Payer: Medicare Other | Admitting: Occupational Therapy

## 2017-11-05 ENCOUNTER — Ambulatory Visit: Payer: Medicare Other | Admitting: Speech Pathology

## 2017-11-05 ENCOUNTER — Encounter: Payer: Self-pay | Admitting: Occupational Therapy

## 2017-11-05 ENCOUNTER — Ambulatory Visit: Payer: Medicare Other | Admitting: Occupational Therapy

## 2017-11-05 ENCOUNTER — Encounter: Payer: Self-pay | Admitting: Speech Pathology

## 2017-11-05 DIAGNOSIS — R4701 Aphasia: Secondary | ICD-10-CM | POA: Diagnosis not present

## 2017-11-05 DIAGNOSIS — M6281 Muscle weakness (generalized): Secondary | ICD-10-CM

## 2017-11-05 DIAGNOSIS — R482 Apraxia: Secondary | ICD-10-CM

## 2017-11-05 DIAGNOSIS — R29818 Other symptoms and signs involving the nervous system: Secondary | ICD-10-CM

## 2017-11-05 DIAGNOSIS — R278 Other lack of coordination: Secondary | ICD-10-CM

## 2017-11-05 DIAGNOSIS — I69351 Hemiplegia and hemiparesis following cerebral infarction affecting right dominant side: Secondary | ICD-10-CM

## 2017-11-05 DIAGNOSIS — I69318 Other symptoms and signs involving cognitive functions following cerebral infarction: Secondary | ICD-10-CM

## 2017-11-05 NOTE — Therapy (Signed)
[facility] [address] [place], [place], [address] Phone: [phone]   Fax:  [phone]  Speech Language Pathology Treatment  Patient Details  Name: Matthew FerdinandDennis Vancleve MRN: [id number] Date of Birth: 09/09/1951 Referring Provider: Dr. Lorretta HarpJairon Downs   Encounter Date: 11/05/2017  End of Session - 11/05/17 1336    Visit Number  10    Number of Visits  24    Date for SLP Re-Evaluation  12/27/17    SLP Start Time  1231    SLP Stop Time   1318    SLP Time Calculation (min)  47 min    Activity Tolerance  Patient tolerated treatment well       History reviewed. No pertinent past medical history.  History reviewed. No pertinent surgical history.  There were no vitals filed for this visit.  Subjective Assessment - 11/05/17 1236    Subjective  "the computer isn't his thing at all"    Patient is accompained by:  Family member    Currently in Pain?  No/denies            ADULT SLP TREATMENT - 11/05/17 1239      General Information   Behavior/Cognition  Alert;Pleasant mood;Cooperative;Requires cueing      Treatment Provided   Treatment provided  Cognitive-Linquistic      Cognitive-Linquistic Treatment   Treatment focused on  Aphasia;Patient/family/caregiver education    Skilled Treatment  Simple conversation with known context pt with 60% accuracy communicating his thought with usual questioning cues. Naming carparts with written cues, phonemic cues and sentence completion cues with 70% accuracy. Write names of basic objects with consistent written cues to copy after 1st word written with min A (ring).       Assessment / Recommendations / Plan   Plan  Continue with current plan of care      Progression Toward Goals   Progression toward goals  Progressing toward goals       SLP Education - 11/05/17 1332    Education provided  Yes    Education Details  activites to facilitate expressive and receptive language  at home (see instructions)    Person(s) Educated  Patient    Methods  Explanation;Demonstration;Handout    Comprehension  Verbalized understanding;Verbal cues required;Need further instruction       SLP Short Term Goals - 11/05/17 1335      SLP SHORT TERM GOAL #1   Title  Pt will ID object to spoken word f:3 8/10x with occasional min gestural cues.    Time  1    Period  Weeks    Status  On-going      SLP SHORT TERM GOAL #2   Title  Pt will ID object to written word f:4 with occasional min A 8/10x    Time  1    Period  Weeks    Status  On-going      SLP SHORT TERM GOAL #3   Title  Pt will name basic objects with sentence completion cues 8/10x    Time  1    Period  Weeks    Status  On-going      SLP SHORT TERM GOAL #4   Title  Pt will write biographical information/family names with occasional mod A 4/5x    Time  1    Period  Weeks    Status  On-going      SLP SHORT TERM GOAL #5   Title  Spouse will utilize  gestures and written word cues to communicate with pt in simple conversation of 3-4 turns with occasional min A.    Time  1    Period  Weeks    Status  On-going       SLP Long Term Goals - 11/05/17 1336      SLP LONG TERM GOAL #1   Title  Pt will ID object to spoken phrase/description f:3 8/10x    Time  9    Period  Weeks    Status  On-going      SLP LONG TERM GOAL #2   Title  Pt will ID object to written phrase f:4 8/10x and occasional min A    Time  9    Period  Weeks    Status  On-going      SLP LONG TERM GOAL #3   Title  Pt will name 5 items in a simple category with occasional min A.    Time  9    Period  Weeks    Status  On-going      SLP LONG TERM GOAL #4   Title  Pt will write 1-2 words in sentence completion task 8/10x with occasional min A    Time  10    Period  Weeks    Status  On-going      SLP LONG TERM GOAL #5   Title  Pt/spouse will utilize multimodal communication for simple conversation of 4-5 turns with occasional min A over 2  sessions.     Time  9    Period  Weeks    Status  On-going       Plan - 11/05/17 1333    Clinical Impression Statement  Pt continues to demonstrate significant non fluent aphasia with ongoing difficulty communicating wants, needs, simple conversation with family and friends. He has improved on simple basic confrontation naming and auditory comprehension. Continue skilled ST to maximize independence and to reduce caregiver burden    Speech Therapy Frequency  3x / week    Treatment/Interventions  Internal/external aids;Compensatory strategies;Environmental controls;Compensatory techniques;Language facilitation;Cueing hierarchy;Multimodal communcation approach;Functional tasks;Patient/family education;SLP instruction and feedback;Cognitive reorganization    Potential Considerations  Severity of impairments    Consulted and Agree with Plan of Care  Patient;Family member/caregiver    Family Member Consulted  spouse [name]      Speech Therapy Progress Note  Dates of Reporting Period: 10/03/17 to 11/05/17  Objective Reports of Subjective Statement: Spouse reports that pt gets very frustrated with his reduced ability to communicate. At this time, pt has not returned to church, hobby (car restoring) due to his severe aphasia  Objective Measurements: Pt 60% successful communicating idea in simple conversation with known topic and usual questioning by listener.   Goal Update: Continue all goals  Plan: Continue  ST POC  Reason Skilled Services are Required: Severe non fluent aphasia persists with 1-4 word utterances lacking content words, paraphasias and perseveration persist. Pt requires spouse A to communicate basic wants/needs and simple conversation.   Patient will benefit from skilled therapeutic intervention in order to improve the following deficits and impairments:   Aphasia  G-Codes - 11/05/17 1338    Functional Assessment Tool Used  NOMS    Functional Limitations  Spoken language  expressive    Spoken Language Expression Current Status [phone](G9162)  At least 60 percent but less than 80 percent impaired, limited or restricted    Spoken Language Expression Goal Status ([id number]([id number])  At least 40 percent but  less than 60 percent impaired, limited or restricted       Problem List There are no active problems to display for this patient.   Loris Seelye, Radene Journey MS, CCC-SLP 11/05/2017, 1:38 PM  [facility] [address] [place], [place], [address] Phone: [phone]   Fax:  [phone]   Name: Glenn Gullickson MRN: [id number] Date of Birth: January 25, 1951

## 2017-11-05 NOTE — Patient Instructions (Signed)
   Continue to look around rooms, places (stores, restaurants) and name things in rooms   Use the white board to give written cues   If the object or item generates a conversation - great!  Continue pictures of family/friends albums to generate naming and conversation. Again, use whiteboard to give written cues  Continue giving sentence completion clues  At the beach, if Maurine MinisterDennis needs to take a break and rest for a little, it's OK  If you are having trouble finding motivation to work, consider scheduling set times of 15 to 20 minutes a few times a day to work on speech

## 2017-11-05 NOTE — Therapy (Signed)
[facility] [address] [place], [place], [address] Phone: [phone]   Fax:  [phone]  Occupational Therapy Treatment  Patient Details  Name: [name] MRN: [id number] Date of Birth: 02/10/1951 Referring Provider: L MCA CVA   Encounter Date: 11/05/2017  OT End of Session - 11/05/17 1627    Visit Number  8    Number of Visits  16    Date for OT Re-Evaluation  11/28/17    Authorization Type  medicare will need PN and G code every 10th visit    Authorization Time Period  60 days    Authorization - Visit Number  8    Authorization - Number of Visits  10    OT Start Time  1147    OT Stop Time  1229    OT Time Calculation (min)  42 min    Activity Tolerance  Patient tolerated treatment well       History reviewed. No pertinent past medical history.  History reviewed. No pertinent surgical history.  There were no vitals filed for this visit.  Subjective Assessment - 11/05/17 1147    Subjective   I guess I could go (when talking about going to lunch with his friends)    Patient is accompained by:  Family member wife [name]    Pertinent History  s/p L CVA    Patient Stated Goals  Pt globally aphasic    Currently in Pain?  Yes    Pain Score  1     Pain Location  Abdomen    Pain Orientation  Mid    Pain Descriptors / Indicators  Sore    Pain Type  Acute pain    Pain Onset  Yesterday    Aggravating Factors   Pt had G tube removed yesterday    Pain Relieving Factors  nothing its mild                   OT Treatments/Exercises (OP) - 11/05/17 0001      ADLs   Grooming  Pt wishes to use wet razor and both pt and wife do not like electric razor.  Wife has been shaving pt.  Practiced today using wet razor with cap on razor - pt had 2 times when razor was improperly oriented to face however quickly recognized this on his own and able to orient appropriately. Pt understands that wife will need to  closely supervise.     ADL Comments  Introduced structured schedule to pt and wife. Adjustments made per pt and wife request and copies provided to wife and pt.  Wife reports that pt is beginning to intitate more at home but feels they both will benefit from a more structured routine.  Assessed grip strength - see goals for updates.       Neurological Re-education Exercises   Other Exercises 1  Neuro re ed to address motor planning, coordination and functional use for bilateral activities such as cutting with scissors, folding page and placing in envelope, and cutting.  Pt required only min facilitation using STOP method 2 times with activities.                 OT Short Term Goals - 11/05/17 1623      OT SHORT TERM GOAL #1   Title  Pt and wife will be mod I with HEP - 10/31/2017    Status  Achieved      OT SHORT TERM  GOAL #2   Title  Pt will demonstrate improved grip strength by 5 pounds to assist with functional tasks (baseline=45)    Status  Achieved 11/05/2017      OT SHORT TERM GOAL #3   Title  Pt will be supervision to shave with electric razor    Status  Deferred pt able to communicate that wife and pt do not like electric razor therefore will defer this goal.      OT SHORT TERM GOAL #4   Title  Pt will be min a for self feeding    Status  Achieved      OT SHORT TERM GOAL #5   Title  Pt will be  mod I with bathing at shower level    Status  Achieved      OT SHORT TERM GOAL #6   Title  Pt will demonstrate ability to use RUE as dominant for basic ADL tasks 25% of the time with cueing    Status  Achieved        OT Long Term Goals - 11/05/17 1624      OT LONG TERM GOAL #1   Title  Pt and wife will be mod I with upgraded HEP - 11/28/2017    Status  On-going      OT LONG TERM GOAL #2   Title  Pt will demonstrate improved grip strength by 7 pounds to assist wtih functional tasks (baseline = 45)    Status  Achieved 11/05/2017  52 pounds      OT LONG TERM GOAL #3    Title  Pt will be mod I with eating    Status  Achieved wife reports that pt is using R hand as dominant with this activity; wife still assisting with cutting however pt able to cut in clinic.  Encouraged wife to allow pt to cut with supervision.       OT LONG TERM GOAL #4   Title  Pt will be  mod I with dresssing.    Status  Achieved      OT LONG TERM GOAL #5   Title  Pt will be mod I with shower transfers    Status  On-going      OT LONG TERM GOAL #6   Title  Pt will be mod I with toilet transfers/toileting    Status  Achieved      OT LONG TERM GOAL #7   Title  Pt will use RUE as dominant at least 50% of the time during ADL activities    Status  On-going      OT LONG TERM GOAL #8   Title  Pt will be able to copy name with 100% legibility    Status  On-going            Plan - 11/05/17 1625    Clinical Impression Statement  Pt progressing toward goals. Pt able to communicate that he is very frustrated with speech.     Rehab Potential  Good    OT Frequency  2x / week    OT Duration  8 weeks    OT Treatment/Interventions  Self-care/ADL training;Electrical Stimulation;DME and/or AE instruction;Neuromuscular education;Therapeutic exercise;Therapeutic activities;Patient/family education;Cognitive remediation/compensation    Plan  check on structured schedule, NMR for functional use of RUE, bilateral activities, incorporate cognition, check homework activities.     Consulted and Agree with Plan of Care  Patient;Family member/caregiver    Family Member Consulted  wife [name]  Patient will benefit from skilled therapeutic intervention in order to improve the following deficits and impairments:  Decreased activity tolerance, Decreased coordination, Decreased cognition, Decreased knowledge of use of DME, Decreased strength, Impaired UE functional use, Impaired sensation  Visit Diagnosis: Apraxia  Hemiplegia and hemiparesis following cerebral infarction affecting right  dominant side (HCC)  Other lack of coordination  Other symptoms and signs involving cognitive functions following cerebral infarction  Other symptoms and signs involving the nervous system  Muscle weakness (generalized)    Problem List There are no active problems to display for this patient.   Norton Pastel, OTR/L 11/05/2017, 4:34 PM  [facility] [address] [place], [place], [address] Phone: [phone]   Fax:  [phone]  Name: [name] MRN: [id number] Date of Birth: 08-04-1951

## 2017-11-07 ENCOUNTER — Encounter: Payer: Self-pay | Admitting: Occupational Therapy

## 2017-11-07 ENCOUNTER — Ambulatory Visit: Payer: Medicare Other | Admitting: Occupational Therapy

## 2017-11-07 ENCOUNTER — Other Ambulatory Visit: Payer: Self-pay

## 2017-11-07 ENCOUNTER — Encounter: Payer: Self-pay | Admitting: Speech Pathology

## 2017-11-07 ENCOUNTER — Ambulatory Visit: Payer: Medicare Other | Admitting: Speech Pathology

## 2017-11-07 DIAGNOSIS — M6281 Muscle weakness (generalized): Secondary | ICD-10-CM

## 2017-11-07 DIAGNOSIS — R482 Apraxia: Secondary | ICD-10-CM

## 2017-11-07 DIAGNOSIS — I69318 Other symptoms and signs involving cognitive functions following cerebral infarction: Secondary | ICD-10-CM

## 2017-11-07 DIAGNOSIS — I69351 Hemiplegia and hemiparesis following cerebral infarction affecting right dominant side: Secondary | ICD-10-CM

## 2017-11-07 DIAGNOSIS — R4701 Aphasia: Secondary | ICD-10-CM

## 2017-11-07 DIAGNOSIS — R278 Other lack of coordination: Secondary | ICD-10-CM

## 2017-11-07 DIAGNOSIS — R29818 Other symptoms and signs involving the nervous system: Secondary | ICD-10-CM

## 2017-11-07 NOTE — Therapy (Signed)
[facility] [address] [place], [place], [address] Phone: [phone]   Fax:  [phone]  Occupational Therapy Treatment  Patient Details  Name: [name] MRN: [id number] Date of Birth: 05/07/1951 Referring Provider: L MCA CVA   Encounter Date: 11/07/2017  OT End of Session - 11/07/17 1502    Visit Number  9    Number of Visits  16    Date for OT Re-Evaluation  11/28/17    Authorization Type  medicare will need PN and G code every 10th visit    Authorization Time Period  60 days    Authorization - Visit Number  9    Authorization - Number of Visits  10    OT Start Time  1403    OT Stop Time  1453    OT Time Calculation (min)  50 min    Activity Tolerance  Patient tolerated treatment well       History reviewed. No pertinent past medical history.  History reviewed. No pertinent surgical history.  There were no vitals filed for this visit.  Subjective Assessment - 11/07/17 1412    Subjective   I shaved but I didn't like the razor I used.     Patient is accompained by:  Family member wife [name]    Pertinent History  s/p L CVA    Patient Stated Goals  Pt globally aphasic                   OT Treatments/Exercises (OP) - 11/07/17 0001      ADLs   ADL Comments  Provided pt and wife with information regarding driving evaluation - pt's wife asked several approrpriate questions.  Strongly recommend that pt have driving eval at later date if he wishes to return to driving as it would be difficult to predict pt's performance given speech deficits as well as apraxia and difficulty fully assessing cognition. Functionally pt's cognition appears intact for basic familiar tasks. Wife verbalized understanding.  Checked goals - see goal section for updates. Pt just had g tube removed last week and has been hesitant to take a shower.  Encouraged pt to to do this weekend.  Pt in agreement.  Pt reports that he  was at the church most of the day yesterday working on projects and plans to assist with stew dinner there this weekend.  Will likely d/c pt from OT next week - pt and wife in agreement.                 OT Short Term Goals - 11/07/17 1459      OT SHORT TERM GOAL #1   Title  Pt and wife will be mod I with HEP - 10/31/2017    Status  Achieved      OT SHORT TERM GOAL #2   Title  Pt will demonstrate improved grip strength by 5 pounds to assist with functional tasks (baseline=45)    Status  Achieved 11/05/2017      OT SHORT TERM GOAL #3   Title  Pt will be supervision to shave     Status  Achieved pt able to shave yesterday with wet razor with supervision (due to blood thinners and apraxia      OT SHORT TERM GOAL #4   Title  Pt will be min a for self feeding    Status  Achieved      OT SHORT TERM GOAL #5   Title  Pt will be  mod I with bathing at shower level    Status  Achieved      OT SHORT TERM GOAL #6   Title  Pt will demonstrate ability to use RUE as dominant for basic ADL tasks 25% of the time with cueing    Status  Achieved        OT Long Term Goals - 11/07/17 1500      OT LONG TERM GOAL #1   Title  Pt and wife will be mod I with upgraded HEP - 11/28/2017    Status  On-going      OT LONG TERM GOAL #2   Title  Pt will demonstrate improved grip strength by 7 pounds to assist wtih functional tasks (baseline = 45)    Status  Achieved 11/05/2017  52 pounds      OT LONG TERM GOAL #3   Title  Pt will be mod I with eating    Status  Achieved wife reports that pt is using R hand as dominant with this activity; wife still assisting with cutting however pt able to cut in clinic.  Encouraged wife to allow pt to cut with supervision.       OT LONG TERM GOAL #4   Title  Pt will be  mod I with dresssing.    Status  Achieved      OT LONG TERM GOAL #5   Title  Pt will be mod I with shower transfers    Status  On-going      OT LONG TERM GOAL #6   Title  Pt will be mod I  with toilet transfers/toileting    Status  Achieved      OT LONG TERM GOAL #7   Title  Pt will use RUE as dominant at least 50% of the time during ADL activities    Status  Achieved      OT LONG TERM GOAL #8   Title  Pt will be able to copy name with 100% legibility    Status  On-going            Plan - 11/07/17 1500    Clinical Impression Statement  Pt with excellent progress toward goals.  Pt with brighter affect today and wife reports that pt participated in church activities all day yesterday and did very well.    Rehab Potential  Good    OT Frequency  2x / week    OT Duration  8 weeks    OT Treatment/Interventions  Self-care/ADL training;Electrical Stimulation;DME and/or AE instruction;Neuromuscular education;Therapeutic exercise;Therapeutic activities;Patient/family education;Cognitive remediation/compensation    Plan  check on showering goal, address copying name, update HEP as necessary, d/c from OT    Consulted and Agree with Plan of Care  Patient;Family member/caregiver    Family Member Consulted  wife [name]       Patient will benefit from skilled therapeutic intervention in order to improve the following deficits and impairments:  Decreased activity tolerance, Decreased coordination, Decreased cognition, Decreased knowledge of use of DME, Decreased strength, Impaired UE functional use, Impaired sensation  Visit Diagnosis: Apraxia  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   [name], [name], OTR/L 11/07/2017, 3:04  PM  [facility] [address] [place], [place],  [address] Phone: [phone]   Fax:  [phone]  Name: [name] MRN: [id number] Date of Birth: March 20, 1951

## 2017-11-07 NOTE — Therapy (Signed)
[facility] [address] [place], [place], [address] Phone: [phone]   Fax:  [phone]  Speech Language Pathology Treatment  Patient Details  Name: [name] MRN: [id number] Date of Birth: 05-Jun-1951 Referring Provider: Dr. Launa Grill   Encounter Date: 11/07/2017  End of Session - 11/07/17 1604    Visit Number  11    Number of Visits  24    Date for SLP Re-Evaluation  12/27/17    SLP Start Time  4235    SLP Stop Time   1400    SLP Time Calculation (min)  46 min    Activity Tolerance  Patient tolerated treatment well       History reviewed. No pertinent past medical history.  History reviewed. No pertinent surgical history.  There were no vitals filed for this visit.         ADULT SLP TREATMENT - 11/07/17 1554      General Information   Behavior/Cognition  Alert;Pleasant mood;Cooperative;Requires cueing      Treatment Provided   Treatment provided  Cognitive-Linquistic      Cognitive-Linquistic Treatment   Treatment focused on  Aphasia;Patient/family/caregiver education    Skilled Treatment  Pt ID'd photo f:3 to word with extended time and occasional min A, 85% accuracy. He ID'd photo to written word f:4 with rare min A and extended time 90% accuracy. Verbal epxression facilitated in naming basic objects in the evironment with sentence completion cues with 85% accuracy.  Pt wrote his name with 80% accuracy, however he required consistent mod copy cues to write his address and spouse names. Initiated simple divergent naming with frequent mod written and semantic cues.       Assessment / Recommendations / Plan   Plan  Continue with current plan of care      Progression Toward Goals   Progression toward goals  Progressing toward goals       SLP Education - 11/07/17 1600    Education provided  Yes    Education Details  Continue daily written and verbal practice, ideally 15 min 3x a day    Person(s) Educated  Patient    Methods  Explanation;Demonstration;Handout    Comprehension  Verbalized understanding       SLP Short Term Goals - 11/07/17 1603      SLP SHORT TERM GOAL #1   Title  Pt will ID object to spoken word f:3 8/10x with occasional min gestural cues.    Time  1    Period  Weeks    Status  Achieved      SLP SHORT TERM GOAL #2   Title  Pt will ID object to written word f:4 with occasional min A 8/10x    Time  1    Period  Weeks    Status  Achieved      SLP SHORT TERM GOAL #3   Title  Pt will name basic objects with sentence completion cues 8/10x    Time  1    Period  Weeks    Status  Achieved      SLP SHORT TERM GOAL #4   Title  Pt will write biographical information/family names with occasional mod A 4/5x    Time  1    Period  Weeks      SLP SHORT TERM GOAL #5   Title  Spouse will utilize gestures and written word cues to communicate with pt in simple conversation of 3-4 turns with occasional  min A.    Time  1    Period  Weeks    Status  On-going       SLP Long Term Goals - 11/07/17 1604      SLP LONG TERM GOAL #1   Title  Pt will ID object to spoken phrase/description f:3 8/10x    Time  9    Period  Weeks    Status  On-going      SLP LONG TERM GOAL #2   Title  Pt will ID object to written phrase f:4 8/10x and occasional min A    Time  9    Period  Weeks    Status  On-going      SLP LONG TERM GOAL #3   Title  Pt will name 5 items in a simple category with occasional min A.    Time  9    Period  Weeks    Status  On-going      SLP LONG TERM GOAL #4   Title  Pt will write 1-2 words in sentence completion task 8/10x with occasional min A    Time  10    Period  Weeks    Status  On-going      SLP LONG TERM GOAL #5   Title  Pt/spouse will utilize multimodal communication for simple conversation of 4-5 turns with occasional min A over 2 sessions.     Time  9    Period  Weeks    Status  On-going       Plan - 11/07/17 1601     Clinical Impression Statement  Checked goals today- pt met 3/4 short term goals. Simple divergent naming with frequent mod written and verbal cues. Pt continues to presentr with severe aphasia - continue skilled ST to maximize communication for indpendence and return to classic car rebuilding.    Speech Therapy Frequency  3x / week    Treatment/Interventions  Internal/external aids;Compensatory strategies;Environmental controls;Compensatory techniques;Language facilitation;Cueing hierarchy;Multimodal communcation approach;Functional tasks;Patient/family education;SLP instruction and feedback;Cognitive reorganization    Potential to Achieve Goals  Good    Potential Considerations  Severity of impairments       Patient will benefit from skilled therapeutic intervention in order to improve the following deficits and impairments:   Aphasia    Problem List There are no active problems to display for this patient.   Lovv[place], Annye Rusk MS, CCC-SLP 11/07/2017, 4:05 PM  [facility] [address] [place], [place], [address] Phone: [phone]   Fax:  [phone]   Name: [name] MRN: [id number] Date of Birth: 11-14-1951

## 2017-11-07 NOTE — Patient Instructions (Signed)
Local Driver Evaluation Programs: ° °Comprehensive Evaluation: includes clinical and in vehicle behind the wheel testing by OCCUPATIONAL THERAPIST. Programs have varying levels of adaptive controls available for trial.  ° °[facility] °[address] °[place], [place]  [address] °[phone] or [phone] °http://www.driver-rehab.com °Evaluator:  Cyndee Crompton, OT/CDRS/CDI/SCDCM/Low Vision Certification ° °[facility] °[address] °[place], [place] [address] °[phone] °https://www.[facility].org/home/services/rehabilitation.aspx °Evaluators:  Shannon Sheek, OT and Jill Tucker, OT ° °[facility] [place] [facility] (ONLY SERVES VETERANS!!) °[facility] °[address] °[place], [place]  [address] °[phone] °http://www.[place].va.gov/services/Physical_Medicine_Rehabilitation_Services.asp °Evaluators:  Eric Andrews, KT; Heidi Harris, KT;  Gary Whitaker, KT (KT=kiniesotherapist) ° ° °Clinical evaluations only:  Includes clinical testing, refers to other programs or local certified driving instructor for behind the wheel testing. ° °[facility] (outpatient Rehab) °[facility] °[address] °[place], [place] [address] °[phone] for scheduling °http://www.[facility].edu/Outpatient-Rehabilitation/Neurorehabilitation-Therapy.htm °Evaluators:  Kelly Lambeth, OT; Kate Phillips, OT ° °Other area clinical evaluators available upon request including [facility], [facility] and [facility]. ° ° °    Resource List °What is a Driver Evaluation: °Your Road Ahead - A Guide to Comprehensive Driving Evaluations °http://www.thehartford.com/resources/mature-market-excellence/publications-on-aging ° °Association for [employer] [facility] [employer] - Disability and Driving Fact Sheets °http://www.aded.net/?page=510 ° °Driving after a Brain  Injury: °[employer] °http://www.biausa.org/tbims-abstracts/if-there-is-an-effective-way-to-determine-if-someone-is-ready-to-drive-after-tbi?A=SearchResult&SearchID=9495675&ObjectID=2758842&ObjectType=35 ° °Driving with Adaptive Equipment: °[facility] °http://www.driver-rehab.com/adaptive-equipment ° °[employer] °http://www.nmeda.com/ ° ° ° ° ° ° °  °

## 2017-11-07 NOTE — Patient Instructions (Signed)
  Writing  Automatic speech  Categories - use written cues and descriptions

## 2017-11-08 ENCOUNTER — Ambulatory Visit: Payer: Medicare Other

## 2017-11-08 DIAGNOSIS — R4701 Aphasia: Secondary | ICD-10-CM

## 2017-11-08 NOTE — Therapy (Signed)
[facility] [address] [place], [place], [address] Phone: [phone]   Fax:  [phone]  Speech Language Pathology Treatment  Patient Details  Name: [name] MRN: [id number] Date of Birth: 03/21/1951 Referring Provider: Dr. Launa Grill   Encounter Date: 11/08/2017  End of Session - 11/08/17 1214    Visit Number  12    Number of Visits  24    Date for SLP Re-Evaluation  12/27/17    SLP Start Time  1104    SLP Stop Time   1145    SLP Time Calculation (min)  41 min    Activity Tolerance  Patient tolerated treatment well       History reviewed. No pertinent past medical history.  History reviewed. No pertinent surgical history.  There were no vitals filed for this visit.  Subjective Assessment - 11/08/17 1110    Subjective  "So - how are you- doing -today?"    Patient is accompained by:  Family member wife            ADULT SLP TREATMENT - 11/08/17 1111      General Information   Behavior/Cognition  Alert;Pleasant mood;Cooperative;Requires cueing      Treatment Provided   Treatment provided  Cognitive-Linquistic      Cognitive-Linquistic Treatment   Treatment focused on  Aphasia    Skilled Treatment  SLP used a [employer] to facilitate salient language from pt. Pt successful at functional message conveyance <50% of the time. SLP encouraged use of compensatory measures such as drawing/writing but pt did not use. Pt was aware of errors at least 90% of the time, SLP often had to encourage pt to cont his utterance. Wife helpful with pt's verbal expression but not over-cueing pt. Pt with more automatic and spontaneous speech today if short in nature (e.g., "well I told him", "you know it's", etc)      Assessment / Recommendations / [place] with current plan of care      Progression Toward Goals   Progression toward goals  Progressing toward goals       SLP Education -  11/07/17 1600    Education provided  Yes    Education Details  Continue daily written and verbal practice, ideally 15 min 3x a day    Person(s) Educated  Patient    Methods  Explanation;Demonstration;Handout    Comprehension  Verbalized understanding       SLP Short Term Goals - 11/08/17 1216      SLP SHORT TERM GOAL #1   Title  Pt will ID object to spoken word f:3 8/10x with occasional min gestural cues.    Status  Achieved      SLP SHORT TERM GOAL #2   Title  Pt will ID object to written word f:4 with occasional min A 8/10x    Status  Achieved      SLP SHORT TERM GOAL #3   Title  Pt will name basic objects with sentence completion cues 8/10x    Status  Achieved      SLP SHORT TERM GOAL #4   Title  Pt will write biographical information/family names with occasional mod A 4/5x    Status  Partially Met      SLP SHORT TERM GOAL #5   Title  Spouse will utilize gestures and written word cues to communicate with pt in simple conversation of 3-4 turns with occasional min A.  Status  Not Met       SLP Long Term Goals - 11/08/17 1217      SLP LONG TERM GOAL #1   Title  Pt will ID object to spoken phrase/description f:3 8/10x    Time  9    Period  Weeks    Status  On-going      SLP LONG TERM GOAL #2   Title  Pt will ID object to written phrase f:4 8/10x and occasional min A    Time  9    Period  Weeks    Status  On-going      SLP LONG TERM GOAL #3   Title  Pt will name 5 items in a simple category with occasional min A.    Time  9    Period  Weeks    Status  On-going      SLP LONG TERM GOAL #4   Title  Pt will write 1-2 words in sentence completion task 8/10x with occasional min A    Time  10    Period  Weeks    Status  On-going      SLP LONG TERM GOAL #5   Title  Pt/spouse will utilize multimodal communication for simple conversation of 4-5 turns with occasional min A over 2 sessions.     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 Simple, short, and spontaneous phrases are more frequently communicated, but then pt ceases attempts rather than use compensations. Continue skilled ST to maximize independence and to reduce caregiver burden    Speech Therapy Frequency  3x / week    Treatment/Interventions  Internal/external aids;Compensatory strategies;Environmental controls;Compensatory techniques;Language facilitation;Cueing hierarchy;Multimodal communcation approach;Functional tasks;Patient/family education;SLP instruction and feedback;Cognitive reorganization    Potential Considerations  Severity of impairments    Consulted and Agree with Plan of Care  Patient;Family member/caregiver    Family Member Consulted  spouse [name]       Patient will benefit from skilled therapeutic intervention in order to improve the following deficits and impairments:   Aphasia    Problem List There are no active problems to display for this patient.   [facility] ,[place], [place]  11/08/2017, 12:17 PM  [facility] [address] [place], [place], [address] Phone: [phone]   Fax:  [phone]   Name: [name] MRN: [id number] Date of Birth: 08/27/1951

## 2017-11-11 ENCOUNTER — Ambulatory Visit: Payer: Medicare Other | Admitting: Occupational Therapy

## 2017-11-11 ENCOUNTER — Encounter: Payer: Self-pay | Admitting: Occupational Therapy

## 2017-11-11 DIAGNOSIS — R482 Apraxia: Secondary | ICD-10-CM

## 2017-11-11 DIAGNOSIS — R278 Other lack of coordination: Secondary | ICD-10-CM

## 2017-11-11 DIAGNOSIS — I69318 Other symptoms and signs involving cognitive functions following cerebral infarction: Secondary | ICD-10-CM

## 2017-11-11 DIAGNOSIS — R29818 Other symptoms and signs involving the nervous system: Secondary | ICD-10-CM

## 2017-11-11 DIAGNOSIS — R4701 Aphasia: Secondary | ICD-10-CM | POA: Diagnosis not present

## 2017-11-11 DIAGNOSIS — M6281 Muscle weakness (generalized): Secondary | ICD-10-CM

## 2017-11-11 DIAGNOSIS — I69351 Hemiplegia and hemiparesis following cerebral infarction affecting right dominant side: Secondary | ICD-10-CM

## 2017-11-11 NOTE — Therapy (Signed)
[facility] [address] [place], [place], [address] Phone: [phone]   Fax:  [phone]  Occupational Therapy Treatment (progress note and discharge summary)  Patient Details  Name: [name] MRN: [id number] Date of Birth: May 10, 1951 Referring Provider: L MCA CVA   Encounter Date: 11/11/2017  OT End of Session - 11/11/17 1251    Visit Number  10    Number of Visits  16    Date for OT Re-Evaluation  11/28/17    Authorization Type  medicare will need PN and G code every 10th visit    Authorization Time Period  60 days    Authorization - Visit Number  10    Authorization - Number of Visits  10    OT Start Time  1103    OT Stop Time  1144    OT Time Calculation (min)  41 min    Activity Tolerance  Patient tolerated treatment well       History reviewed. No pertinent past medical history.  History reviewed. No pertinent surgical history.  There were no vitals filed for this visit.  Subjective Assessment - 11/11/17 1106    Subjective   I did that pretty good    Patient is accompained by:  Family member wife [name]    Pertinent History  s/p L CVA    Patient Stated Goals  Pt globally aphasic    Currently in Pain?  No/denies                   OT Treatments/Exercises (OP) - 11/11/17 0001      ADLs   ADL Comments  Finalized all goals - see goal section for update.  Pt has met all goals -reviewed progress with pt and pt's wife      Fine Motor Coordination   Other Fine Motor Exercises  Addressed fine motor coordination and in hand manipulation with small objects with grooved peg board, [place] dexterity without tweezers, and [employer] peg board.  Pt able to complete first two without difficulty, and [employer] peg board with minimal difficulty.  Pt is ready for d/c from OT and pt in agreement.                 OT Short Term Goals - 11/11/17 1249      OT SHORT TERM GOAL #1   Title  Pt and wife  will be mod I with HEP - 10/31/2017    Status  Achieved      OT SHORT TERM GOAL #2   Title  Pt will demonstrate improved grip strength by 5 pounds to assist with functional tasks (baseline=45)    Status  Achieved 11/05/2017      OT SHORT TERM GOAL #3   Title  Pt will be supervision to shave     Status  Achieved pt able to shave yesterday with wet razor with supervision (due to blood thinners and apraxia      OT SHORT TERM GOAL #4   Title  Pt will be min a for self feeding    Status  Achieved      OT SHORT TERM GOAL #5   Title  Pt will be  mod I with bathing at shower level    Status  Achieved      OT SHORT TERM GOAL #6   Title  Pt will demonstrate ability to use RUE as dominant for basic ADL tasks 25% of the time with cueing  Status  Achieved        OT Long Term Goals - 11/24/2017 1250      OT LONG TERM GOAL #1   Title  Pt and wife will be mod I with upgraded HEP - 11/28/2017    Status  Achieved      OT LONG TERM GOAL #2   Title  Pt will demonstrate improved grip strength by 7 pounds to assist wtih functional tasks (baseline = 45)    Status  Achieved Nov 24, 2017 65 pounds      OT LONG TERM GOAL #3   Title  Pt will be mod I with eating    Status  Achieved wife reports that pt is using R hand as dominant with this activity; wife still assisting with cutting however pt able to cut in clinic.  Encouraged wife to allow pt to cut with supervision.       OT LONG TERM GOAL #4   Title  Pt will be  mod I with dresssing.    Status  Achieved      OT LONG TERM GOAL #5   Title  Pt will be mod I with shower transfers    Status  Achieved 12/21/2017 wife reports pt showered independently this weeken      OT LONG TERM GOAL #6   Title  Pt will be mod I with toilet transfers/toileting    Status  Achieved      OT LONG TERM GOAL #7   Title  Pt will use RUE as dominant at least 50% of the time during ADL activities    Status  Achieved      OT LONG TERM GOAL #8   Title  Pt will be able  to copy name with 100% legibility    Status  Achieved            Plan - 11/24/2017 1250    Clinical Impression Statement  Pt has met all goals and is ready for d/c from OT. Pt and wife in agreement.     Rehab Potential  Good    OT Frequency  2x / week    OT Duration  8 weeks    OT Treatment/Interventions  Self-care/ADL training;Electrical Stimulation;DME and/or AE instruction;Neuromuscular education;Therapeutic exercise;Therapeutic activities;Patient/family education;Cognitive remediation/compensation    Plan  d/c from OT    Consulted and Agree with Plan of Care  Patient;Family member/caregiver    Family Member Consulted  wife [name]       Patient will benefit from skilled therapeutic intervention in order to improve the following deficits and impairments:  Decreased activity tolerance, Decreased coordination, Decreased cognition, Decreased knowledge of use of DME, Decreased strength, Impaired UE functional use, Impaired sensation  Visit Diagnosis: Apraxia  Hemiplegia and hemiparesis following cerebral infarction affecting right dominant side (HCC)  Other lack of coordination  Other symptoms and signs involving cognitive functions following cerebral infarction  Other symptoms and signs involving the nervous system  Muscle weakness (generalized)  G-Codes - 11-24-17 1253    Functional Assessment Tool Used (Outpatient only)  skilled clinical observation, dynamometer    Functional Limitation  Carrying, moving and handling objects    Carrying, Moving and Handling Objects Current Status (Z6109)  At least 1 percent but less than 20 percent impaired, limited or restricted    Carrying, Moving and Handling Objects Goal Status (U0454)  At least 20 percent but less than 40 percent impaired, limited or restricted    Carrying, Moving and Handling Objects Discharge Status (  G8986)  At least 1 percent but less than 20 percent impaired, limited or restricted      OCCUPATIONAL THERAPY  DISCHARGE SUMMARY  Visits from Start of Care: 10 (dates of care from 10/03/2017 - 11/11/2017)  Current functional level related to goals / functional outcomes: See above   Remaining deficits: Mild apraxia, mild incoordination, sensory impairment RUE, higher level cognitive deficits (ST continues to address cognitive linguistic deficits)  Education / Equipment: HEP Plan: Patient agrees to discharge.  Patient goals were met. Patient is being discharged due to meeting the stated rehab goals.  ?????      Problem List There are no active problems to display for this patient.   Quay Burow, OTR/L 11/11/2017, 12:54 PM  [facility] [address] [place], [place], [address] Phone: [phone]   Fax:  [phone]  Name: [name] MRN: [id number] Date of Birth: May 18, 1951

## 2017-11-12 ENCOUNTER — Ambulatory Visit: Payer: Medicare Other | Admitting: Occupational Therapy

## 2017-11-18 ENCOUNTER — Encounter: Payer: PRIVATE HEALTH INSURANCE | Admitting: Occupational Therapy

## 2017-11-19 ENCOUNTER — Ambulatory Visit: Payer: Medicare Other | Admitting: Speech Pathology

## 2017-11-19 ENCOUNTER — Other Ambulatory Visit: Payer: Self-pay

## 2017-11-19 ENCOUNTER — Encounter: Payer: PRIVATE HEALTH INSURANCE | Admitting: Occupational Therapy

## 2017-11-19 DIAGNOSIS — R4701 Aphasia: Secondary | ICD-10-CM

## 2017-11-19 NOTE — Therapy (Signed)
[facility] [address] [place], [place], [address] Phone: [phone]   Fax:  [phone]  Speech Language Pathology Treatment  Patient Details  Name: [name] MRN: [id number] Date of Birth: [birth year] Referring Provider: Dr. Launa Grill   Encounter Date: 11/19/2017  End of Session - 11/19/17 1208    Visit Number  13    Number of Visits  24    Date for SLP Re-Evaluation  12/27/17    SLP Start Time  3    SLP Stop Time   1150    SLP Time Calculation (min)  50 min    Activity Tolerance  Patient tolerated treatment well       No past medical history on file.  No past surgical history on file.  There were no vitals filed for this visit.  Subjective Assessment - 11/19/17 1104    Subjective  "My brother is in ICU - we have limited practice"    Patient is accompained by:  Family member    Currently in Pain?  No/denies            ADULT SLP TREATMENT - 11/19/17 1117      General Information   Behavior/Cognition  Alert;Pleasant mood;Cooperative;Requires cueing      Treatment Provided   Treatment provided  Cognitive-Linquistic      Cognitive-Linquistic Treatment   Treatment focused on  Aphasia    Skilled Treatment  Auditory comprehension facilitated by pt IDing picture to word f:10 with 80% accuracy. ID picture f:10 to phrase 60% with usual repeition and semantic cues. Facilitated naming with mid frequency words, semantic cues, written cues 60%. Written expression of simple words 3/6 with extended time, 6/6 with copy cues. Written perseveration occurred.       Assessment / Recommendations / Plan   Plan  Continue with current plan of care      Progression Toward Goals   Progression toward goals  Progressing toward goals       SLP Education - 11/19/17 1202    Education provided  Yes    Education Details  Put speech practice in your schedule; importance of daily practice 15 min 3x a day        SLP Short Term Goals - 11/19/17 1206      SLP SHORT TERM GOAL #1   Title  Pt will ID object to spoken word f:3 8/10x with occasional min gestural cues.    Status  Achieved      SLP SHORT TERM GOAL #2   Title  Pt will ID object to written word f:4 with occasional min A 8/10x    Status  Achieved      SLP SHORT TERM GOAL #3   Title  Pt will name basic objects with sentence completion cues 8/10x    Status  Achieved      SLP SHORT TERM GOAL #4   Title  Pt will write biographical information/family names with occasional mod A 4/5x    Status  Partially Met      SLP SHORT TERM GOAL #5   Title  Spouse will utilize gestures and written word cues to communicate with pt in simple conversation of 3-4 turns with occasional min A.    Status  Not Met       SLP Long Term Goals - 11/19/17 1207      SLP LONG TERM GOAL #1   Title  Pt will ID object to spoken phrase/description f:3 8/10x  Time  8    Period  Weeks    Status  On-going      SLP LONG TERM GOAL #2   Title  Pt will ID object to written phrase f:4 8/10x and occasional min A    Time  8    Period  Weeks    Status  On-going      SLP LONG TERM GOAL #3   Title  Pt will name 5 items in a simple category with occasional min A.    Time  8    Period  Weeks    Status  On-going      SLP LONG TERM GOAL #4   Title  Pt will write 1-2 words in sentence completion task 8/10x with occasional min A    Time  8    Period  Weeks    Status  On-going      SLP LONG TERM GOAL #5   Title  Pt/spouse will utilize multimodal communication for simple conversation of 4-5 turns with occasional min A over 2 sessions.     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 Simple, short, and spontaneous phrases are more frequently  communicated, but then pt ceases attempts rather than use compensations. Continue skilled ST to maximize independence and to reduce caregiver burden    Speech Therapy Frequency  3x / week    Treatment/Interventions  Internal/external aids;Compensatory strategies;Environmental controls;Compensatory techniques;Language facilitation;Cueing hierarchy;Multimodal communcation approach;Functional tasks;Patient/family education;SLP instruction and feedback;Cognitive reorganization    Potential Considerations  Severity of impairments    Consulted and Agree with Plan of Care  Patient;Family member/caregiver    Family Member Consulted  spouse [name]       Patient will benefit from skilled therapeutic intervention in order to improve the following deficits and impairments:   Aphasia    Problem List There are no active problems to display for this patient.   Batu Cassin, Annye Rusk MS, CCC-SLP 11/19/2017, 12:09 PM  [facility] [address] [place], [place], [address] Phone: [phone]   Fax:  [phone]   Name: [name] MRN: [id number] Date of Birth: [birth year]

## 2017-11-21 ENCOUNTER — Encounter: Payer: Self-pay | Admitting: Speech Pathology

## 2017-11-21 ENCOUNTER — Other Ambulatory Visit: Payer: Self-pay

## 2017-11-21 ENCOUNTER — Ambulatory Visit: Payer: Medicare Other | Admitting: Speech Pathology

## 2017-11-21 ENCOUNTER — Encounter: Payer: PRIVATE HEALTH INSURANCE | Admitting: Occupational Therapy

## 2017-11-21 DIAGNOSIS — R4701 Aphasia: Secondary | ICD-10-CM

## 2017-11-21 NOTE — Therapy (Signed)
[facility] [address] [place], [place], [address] Phone: [phone]   Fax:  [phone]  Speech Language Pathology Treatment  Patient Details  Name: [name] MRN: [id number] Date of Birth: [birth year] Referring Provider: Dr. Launa Grill   Encounter Date: 11/21/2017  End of Session - 11/21/17 1954    Visit Number  14    Number of Visits  24    Date for SLP Re-Evaluation  12/27/17    SLP Start Time  2951    SLP Stop Time   1315    SLP Time Calculation (min)  44 min    Activity Tolerance  Patient tolerated treatment well       History reviewed. No pertinent past medical history.  History reviewed. No pertinent surgical history.  There were no vitals filed for this visit.  Subjective Assessment - 11/21/17 1254    Subjective  Pt's brother in law, Doc, attended today"    Patient is accompained by:  Family member    Currently in Pain?  No/denies    Pain Location  Face            ADULT SLP TREATMENT - 11/21/17 1309      General Information   Behavior/Cognition  Alert;Pleasant mood;Cooperative;Requires cueing      Treatment Provided   Treatment provided  Cognitive-Linquistic      Cognitive-Linquistic Treatment   Treatment focused on  Aphasia    Skilled Treatment  Reading comprehension targeted reading word and matching symbol/piciture f:6 with mid frequency words with 80% accuacy and occasional min A. Verbal expression facilitated by having pt name mid frequency symbols/pictures with usual mod semantic/cloze cues 80% accuracy. Written exrepression and functional math counting coins and writing amounts with occasional mod A for error correction. Pt error awareness with min A, however correction with usual mod A.       Assessment / Recommendations / Plan   Plan  Continue with current plan of care      Progression Toward Goals   Progression toward goals  Progressing toward goals       SLP  Education - 11/21/17 1952    Education provided  Yes    Person(s) Educated  Patient;Other (comment) brother in law, "Doc"   brother in law, "Doc"   Methods  Explanation;Demonstration;Verbal cues    Comprehension  Verbalized understanding;Verbal cues required;Need further instruction       SLP Short Term Goals - 11/21/17 1954      SLP SHORT TERM GOAL #1   Title  Pt will ID object to spoken word f:3 8/10x with occasional min gestural cues.    Status  Achieved      SLP SHORT TERM GOAL #2   Title  Pt will ID object to written word f:4 with occasional min A 8/10x    Status  Achieved      SLP SHORT TERM GOAL #3   Title  Pt will name basic objects with sentence completion cues 8/10x    Status  Achieved      SLP SHORT TERM GOAL #4   Title  Pt will write biographical information/family names with occasional mod A 4/5x    Status  Partially Met      SLP SHORT TERM GOAL #5   Title  Spouse will utilize gestures and written word cues to communicate with pt in simple conversation of 3-4 turns with occasional min A.    Status  Not Met  SLP Long Term Goals - 11/21/17 1954      SLP LONG TERM GOAL #1   Title  Pt will ID object to spoken phrase/description f:3 8/10x    Time  8    Period  Weeks    Status  On-going      SLP LONG TERM GOAL #2   Title  Pt will ID object to written phrase f:4 8/10x and occasional min A    Time  8    Period  Weeks    Status  On-going      SLP LONG TERM GOAL #3   Title  Pt will name 5 items in a simple category with occasional min A.    Time  8    Period  Weeks    Status  On-going      SLP LONG TERM GOAL #4   Title  Pt will write 1-2 words in sentence completion task 8/10x with occasional min A    Time  8    Period  Weeks    Status  On-going      SLP LONG TERM GOAL #5   Title  Pt/spouse will utilize multimodal communication for simple conversation of 4-5 turns with occasional min A over 2 sessions.     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 Simple, short, and spontaneous phrases are more frequently communicated, but then pt ceases attempts rather than use compensations. Continue skilled ST to maximize independence and to reduce caregiver burden    Speech Therapy Frequency  3x / week    Treatment/Interventions  Internal/external aids;Compensatory strategies;Environmental controls;Compensatory techniques;Language facilitation;Cueing hierarchy;Multimodal communcation approach;Functional tasks;Patient/family education;SLP instruction and feedback;Cognitive reorganization    Potential Considerations  Severity of impairments       Patient will benefit from skilled therapeutic intervention in order to improve the following deficits and impairments:   Aphasia    Problem List There are no active problems to display for this patient.   Lovvorn, Annye Rusk  MS, CCC-SLP 11/21/2017, 7:55 PM  [facility] [address] [place], [place], [address] Phone: [phone]   Fax:  [phone]   Name: [name] MRN: [id number] Date of Birth: October 26, 1951

## 2017-11-21 NOTE — Patient Instructions (Signed)
  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

## 2017-11-26 ENCOUNTER — Encounter: Payer: Self-pay | Admitting: Speech Pathology

## 2017-11-26 ENCOUNTER — Encounter: Payer: PRIVATE HEALTH INSURANCE | Admitting: Occupational Therapy

## 2017-11-26 ENCOUNTER — Ambulatory Visit: Payer: Medicare Other | Attending: Physical Medicine and Rehabilitation | Admitting: Speech Pathology

## 2017-11-26 ENCOUNTER — Other Ambulatory Visit: Payer: Self-pay

## 2017-11-26 DIAGNOSIS — R4701 Aphasia: Secondary | ICD-10-CM | POA: Insufficient documentation

## 2017-11-26 DIAGNOSIS — R482 Apraxia: Secondary | ICD-10-CM | POA: Insufficient documentation

## 2017-11-26 NOTE — Therapy (Signed)
[facility] [address] [place], [place], [address] Phone: [phone]   Fax:  [phone]  Speech Language Pathology Treatment  Patient Details  Name: [name] MRN: [id number] Date of Birth: 1951/11/19 Referring Provider: Dr. Launa Grill   Encounter Date: 11/26/2017  End of Session - 11/26/17 1204    Visit Number  15    Number of Visits  24    Date for SLP Re-Evaluation  12/27/17    SLP Start Time  1103    SLP Stop Time   1150    SLP Time Calculation (min)  47 min    Activity Tolerance  Patient tolerated treatment well       History reviewed. No pertinent past medical history.  History reviewed. No pertinent surgical history.  There were no vitals filed for this visit.  Subjective Assessment - 11/26/17 1110    Subjective  "He has been sanding, been to his church, and moved his truck since you gave him what for"    Patient is accompained by:  Family member            ADULT SLP TREATMENT - 11/26/17 1112      General Information   Behavior/Cognition  Alert;Pleasant mood;Cooperative;Requires cueing      Treatment Provided   Treatment provided  Cognitive-Linquistic      Cognitive-Linquistic Treatment   Treatment focused on  Aphasia    Skilled Treatment  Pt verbally expressed what he has been working on (going to church, working in shop,) with usual questioning for clarification by ST. Simple diverent naming with 3-4 items in category with consistent max written and phonemic/semantic cues. Written expression at word level with frequent max A. Writting a letter to dictation in random sequence 85% accuracy with usual repetition. Writting letter to dictation in 3 letter word less than 50% accuracy and consistent copy cues. Written expression of numbers in simple functional math with frequent max A. Pt benefits from letter bank on writing 3 letter words for homework.       Assessment / Recommendations /  Plan   Plan  Continue with current plan of care      Progression Toward Goals   Progression toward goals  Progressing toward goals       SLP Education - 11/26/17 1200    Education provided  Yes    Education Details  Written cues for writing HW, it's OK to put in 1st letter or vowel to assist pt    Person(s) Educated  Patient;Spouse    Methods  Explanation;Demonstration    Comprehension  Verbalized understanding       SLP Short Term Goals - 11/26/17 1203      SLP SHORT TERM GOAL #1   Title  Pt will ID object to spoken word f:3 8/10x with occasional min gestural cues.    Status  Achieved      SLP SHORT TERM GOAL #2   Title  Pt will ID object to written word f:4 with occasional min A 8/10x    Status  Achieved      SLP SHORT TERM GOAL #3   Title  Pt will name basic objects with sentence completion cues 8/10x    Status  Achieved      SLP SHORT TERM GOAL #4   Title  Pt will write biographical information/family names with occasional mod A 4/5x    Status  Partially Met      SLP SHORT TERM GOAL #5  Title  Spouse will utilize gestures and written word cues to communicate with pt in simple conversation of 3-4 turns with occasional min A.    Status  Not Met       SLP Long Term Goals - 11/26/17 1203      SLP LONG TERM GOAL #1   Title  Pt will ID object to spoken phrase/description f:3 8/10x    Time  7    Period  Weeks    Status  On-going      SLP LONG TERM GOAL #2   Title  Pt will ID object to written phrase f:4 8/10x and occasional min A    Time  7    Period  Weeks    Status  On-going      SLP LONG TERM GOAL #3   Title  Pt will name 5 items in a simple category with occasional min A.    Time  7    Period  Weeks    Status  On-going      SLP LONG TERM GOAL #4   Title  Pt will write 1-2 words in sentence completion task 8/10x with occasional min A    Time  7    Period  Weeks    Status  On-going      SLP LONG TERM GOAL #5   Title  Pt/spouse will utilize  multimodal communication for simple conversation of 4-5 turns with occasional min A over 2 sessions.     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 Progress on naming, writing, reading continues slowly.  Pt is using longer (3-4 word) utterances in converasation with lack of content words.Continue skilled ST to maximize communication for improved independence.     Speech Therapy Frequency  3x / week    Treatment/Interventions  Internal/external aids;Compensatory strategies;Environmental controls;Compensatory techniques;Language facilitation;Cueing hierarchy;Multimodal communcation approach;Functional tasks;Patient/family education;SLP instruction and feedback;Cognitive reorganization    Potential to Achieve Goals  Good    Potential Considerations  Severity of impairments       Patient will benefit from skilled therapeutic intervention in order to improve the following deficits and impairments:   Aphasia    Problem List There are no active problems to display for this patient.   Lovvorn, Annye Rusk MS, CCC-SLP 11/26/2017, 12:04 PM  [facility] [address] [place], [place], [address] Phone: [phone]   Fax:  [phone]   Name: [name] MRN: [id number] Date of Birth: [birth year]

## 2017-11-28 ENCOUNTER — Encounter: Payer: Self-pay | Admitting: Speech Pathology

## 2017-11-28 ENCOUNTER — Encounter: Payer: PRIVATE HEALTH INSURANCE | Admitting: Occupational Therapy

## 2017-11-28 ENCOUNTER — Ambulatory Visit: Payer: Medicare Other | Admitting: Speech Pathology

## 2017-11-28 ENCOUNTER — Other Ambulatory Visit: Payer: Self-pay

## 2017-11-28 DIAGNOSIS — R4701 Aphasia: Secondary | ICD-10-CM

## 2017-11-28 NOTE — Therapy (Signed)
[facility] [address] [place], [place], [address] Phone: [phone]   Fax:  [phone]  Speech Language Pathology Treatment  Patient Details  Name: [name] MRN: [id number] Date of Birth: August 07, 1951 Referring Provider: Dr. Launa Grill   Encounter Date: 11/28/2017  End of Session - 11/28/17 1157    Visit Number  16    Number of Visits  24    Date for SLP Re-Evaluation  12/27/17    SLP Start Time  1100    SLP Stop Time   1147    SLP Time Calculation (min)  47 min    Activity Tolerance  Patient tolerated treatment well       History reviewed. No pertinent past medical history.  History reviewed. No pertinent surgical history.  There were no vitals filed for this visit.  Subjective Assessment - 11/28/17 1111    Subjective  "He did his homework writng 4 letter words, I gave him the 1st letter"            ADULT SLP TREATMENT - 11/28/17 1111      General Information   Behavior/Cognition  Alert;Pleasant mood;Cooperative;Requires cueing      Treatment Provided   Treatment provided  Cognitive-Linquistic      Cognitive-Linquistic Treatment   Treatment focused on  Aphasia    Skilled Treatment  Pt explained what his neurologist with min A from [place]. 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 Simple divergent naming and convergent naming 65% with usual mod A.       Progression Toward Goals   Progression toward goals  Progressing toward goals       SLP Education - 11/28/17 1156    Education provided  Yes    Education Details  Continue naming things at home and in community    Person(s) Educated  Patient;Spouse    Methods  Explanation;Demonstration    Comprehension  Verbalized understanding       SLP Short Term Goals - 11/28/17 1157      SLP SHORT TERM GOAL #1   Title  Pt will ID object to spoken  word f:3 8/10x with occasional min gestural cues.    Status  Achieved      SLP SHORT TERM GOAL #2   Title  Pt will ID object to written word f:4 with occasional min A 8/10x    Status  Achieved      SLP SHORT TERM GOAL #3   Title  Pt will name basic objects with sentence completion cues 8/10x    Status  Achieved      SLP SHORT TERM GOAL #4   Title  Pt will write biographical information/family names with occasional mod A 4/5x    Status  Partially Met      SLP SHORT TERM GOAL #5   Title  Spouse will utilize gestures and written word cues to communicate with pt in simple conversation of 3-4 turns with occasional min A.    Status  Not Met       SLP Long Term Goals - 11/28/17 1157      SLP LONG TERM GOAL #1   Title  Pt will ID object to spoken phrase/description f:3 8/10x    Time  7    Period  Weeks    Status  On-going      SLP LONG TERM GOAL #2  Title  Pt will ID object to written phrase f:4 8/10x and occasional min A    Time  7    Period  Weeks    Status  On-going      SLP LONG TERM GOAL #3   Title  Pt will name 5 items in a simple category with occasional min A.    Time  7    Period  Weeks    Status  On-going      SLP LONG TERM GOAL #4   Title  Pt will write 1-2 words in sentence completion task 8/10x with occasional min A    Time  7    Period  Weeks    Status  On-going      SLP LONG TERM GOAL #5   Title  Pt/spouse will utilize multimodal communication for simple conversation of 4-5 turns with occasional min A over 2 sessions.     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 Progress on naming, writing, reading continues slowly.  Pt is using longer (3-4 word) utterances in converasation with lack of content words.Continue skilled ST to maximize communication for improved independence.     Speech Therapy Frequency  3x / week    Treatment/Interventions   Internal/external aids;Compensatory strategies;Environmental controls;Compensatory techniques;Language facilitation;Cueing hierarchy;Multimodal communcation approach;Functional tasks;Patient/family education;SLP instruction and feedback;Cognitive reorganization    Potential Considerations  Severity of impairments    Consulted and Agree with Plan of Care  Patient;Family member/caregiver    Family Member Consulted  spouse [name]       Patient will benefit from skilled therapeutic intervention in order to improve the following deficits and impairments:   Aphasia    Problem List There are no active problems to display for this patient.   Mahathi Pokorney, Annye Rusk MS, CCC-SLP 11/28/2017, 12:03 PM  [facility] [address] [place], [place], [address] Phone: [phone]   Fax:  [phone]   Name: [name] MRN: [id number] Date of Birth: 1951/08/20

## 2017-11-29 ENCOUNTER — Encounter: Payer: PRIVATE HEALTH INSURANCE | Admitting: *Deleted

## 2017-12-02 ENCOUNTER — Ambulatory Visit: Payer: Medicare Other

## 2017-12-02 ENCOUNTER — Encounter: Payer: PRIVATE HEALTH INSURANCE | Admitting: Occupational Therapy

## 2017-12-03 ENCOUNTER — Encounter: Payer: PRIVATE HEALTH INSURANCE | Admitting: Occupational Therapy

## 2017-12-03 ENCOUNTER — Other Ambulatory Visit: Payer: Self-pay

## 2017-12-03 ENCOUNTER — Ambulatory Visit: Payer: Medicare Other

## 2017-12-03 DIAGNOSIS — R4701 Aphasia: Secondary | ICD-10-CM | POA: Diagnosis not present

## 2017-12-03 DIAGNOSIS — R482 Apraxia: Secondary | ICD-10-CM

## 2017-12-03 NOTE — Therapy (Signed)
[facility] [address] [place], [place], [address] Phone: [phone]   Fax:  [phone]  Speech Language Pathology Treatment  Patient Details  Name: [name] MRN: [id number] Date of Birth: 03/02/1951 Referring Provider: Dr. Launa Grill   Encounter Date: 12/03/2017  End of Session - 12/03/17 1503    Visit Number  17    Number of Visits  24    Date for SLP Re-Evaluation  12/27/17    SLP Start Time  1319    SLP Stop Time   1407    SLP Time Calculation (min)  48 min    Activity Tolerance  Patient tolerated treatment well       History reviewed. No pertinent past medical history.  History reviewed. No pertinent surgical history.  There were no vitals filed for this visit.  Subjective Assessment - 12/03/17 1319    Patient is accompained by:  Family member wife    Currently in Pain?  No/denies            ADULT SLP TREATMENT - 12/03/17 1320      General Information   Behavior/Cognition  Alert;Pleasant mood;Cooperative;Requires cueing      Treatment Provided   Treatment provided  Cognitive-Linquistic      Cognitive-Linquistic Treatment   Treatment focused on  Aphasia    Skilled Treatment  SLP worked with pt's expressive language re: what he did in the shop last week with his friend. SLP needed to repeat question x2 for pt to comprehend question. Pt told SLP with mod cues from SLP and wife.Pt's wife reported pt appears incr'd depressed - desires more sleeping than before. Written expression: Pt worked with pt writing words into proper categories. He req'd occasional A for semantics of target word and of cateogry, and at times knew word but was unable to verbalize due to apraxia. SLP cued wife how to work with pt with this task at home. Pt attempted to tell SLP about yesterday but pt unsuccessful. 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 Pt nodding his head as wife explaining this. SLP learned pt easily frustrated with homework - SLP suggested pt work for 20 minutes then take 1 hour-2 hour break and return for 2-3 total 20-minute sessions each day.      Assessment / Recommendations / Plan   Plan  Continue with current plan of care      Progression Toward Goals   Progression toward goals  Progressing toward goals       SLP Education - 12/03/17 1502    Education provided  Yes    Education Details  strategies to complete homework without/with reduced frustration, tips to assist pt at home with homework    Person(s) Educated  Patient;Spouse    Methods  Explanation;Demonstration    Comprehension  Verbalized understanding       SLP Short Term Goals - 11/28/17 1157      SLP SHORT TERM GOAL #1   Title  Pt will ID object to spoken word f:3 8/10x with occasional min gestural cues.    Status  Achieved      SLP SHORT TERM GOAL #2   Title  Pt will ID object to written word f:4 with occasional min A 8/10x    Status  Achieved      SLP SHORT TERM GOAL #3  Title  Pt will name basic objects with sentence completion cues 8/10x    Status  Achieved      SLP SHORT TERM GOAL #4   Title  Pt will write biographical information/family names with occasional mod A 4/5x    Status  Partially Met      SLP SHORT TERM GOAL #5   Title  Spouse will utilize gestures and written word cues to communicate with pt in simple conversation of 3-4 turns with occasional min A.    Status  Not Met       SLP Long Term Goals - 12/03/17 1504      SLP LONG TERM GOAL #1   Title  Pt will ID object to spoken phrase/description f:3 8/10x    Time  6    Period  Weeks    Status  On-going      SLP LONG TERM GOAL #2   Title  Pt will ID object to written phrase f:4 8/10x and occasional min A    Time  6    Period  Weeks    Status  On-going      SLP LONG TERM GOAL #3   Title  Pt will name 5 items in a  simple category with occasional min A.    Time  6    Period  Weeks    Status  On-going      SLP LONG TERM GOAL #4   Title  Pt will write 1-2 words in sentence completion task 8/10x with occasional min A    Time  6    Period  Weeks    Status  On-going      SLP LONG TERM GOAL #5   Title  Pt/spouse will utilize multimodal communication for simple conversation of 4-5 turns with occasional min A over 2 sessions.     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 Progress on naming, writing, reading continues slowly.  Pt is used longer (3-4 word) utterances in converasation with rare to occasional content words (more if pt less frustrated with lack of verbalization). Continue skilled ST to maximize communication for improved independence.     Speech Therapy Frequency  3x / week    Treatment/Interventions  Internal/external aids;Compensatory strategies;Environmental controls;Compensatory techniques;Language facilitation;Cueing hierarchy;Multimodal communcation approach;Functional tasks;Patient/family education;SLP instruction and feedback;Cognitive reorganization    Potential Considerations  Severity of impairments    Consulted and Agree with Plan of Care  Patient;Family member/caregiver    Family Member Consulted  spouse [name]       Patient will benefit from skilled therapeutic intervention in order to improve the following deficits and impairments:   Aphasia  Apraxia    Problem List There are no active problems to display for this patient.   [facility] ,[place], [place]  12/03/2017, 3:05 PM  [facility] [address] [place], [place], [address] Phone: [phone]   Fax:  [phone]   Name: Mikhai Bienvenue MRN: [id number] Date of Birth: [birth year]

## 2017-12-04 ENCOUNTER — Ambulatory Visit: Payer: Medicare Other

## 2017-12-04 ENCOUNTER — Other Ambulatory Visit: Payer: Self-pay

## 2017-12-04 DIAGNOSIS — R4701 Aphasia: Secondary | ICD-10-CM

## 2017-12-04 DIAGNOSIS — R482 Apraxia: Secondary | ICD-10-CM

## 2017-12-04 NOTE — Therapy (Signed)
[facility] [address] [place], [place], [address] Phone: [phone]   Fax:  [phone]  Speech Language Pathology Treatment  Patient Details  Name: [name] MRN: [id number] Date of Birth: 05-Dec-1951 Referring Provider: Dr. Launa Grill   Encounter Date: 12/04/2017  End of Session - 12/04/17 1117    Visit Number  18    Number of Visits  24    Date for SLP Re-Evaluation  12/27/17    SLP Start Time  1018    SLP Stop Time   1103    SLP Time Calculation (min)  45 min    Activity Tolerance  Patient tolerated treatment well       History reviewed. No pertinent past medical history.  History reviewed. No pertinent surgical history.  There were no vitals filed for this visit.  Subjective Assessment - 12/04/17 1023    Subjective  Pt shows SLP writing homework completed. "He started (wrote) some where I didn't have to show him the words." -wife    Patient is accompained by:  Family member wife    Currently in Pain?  No/denies            ADULT SLP TREATMENT - 12/04/17 1024      General Information   Behavior/Cognition  Alert;Pleasant mood;Cooperative;Requires cueing      Treatment Provided   Treatment provided  Cognitive-Linquistic      Cognitive-Linquistic Treatment   Treatment focused on  Aphasia;Apraxia    Skilled Treatment  SLP worked with pt's reading single words (f:4), confrontation naming, writing single words, and spelling aloud. Pt 90% with selecting word of a picture from f:4, 75% with naming on first attempt (worse on last 3 items due to fatigue but 100% prior to that), and 50% with spelling aloud with usual mod-max A. Writing the item completed with 90% success with initial min-mod A. SLP encouraged wife to call [facility] and see if they can get their 15 day trial reinstated as pt did not use his first one. Wife said pt was motivated last night to complete his homework last night.       Assessment / Recommendations / Plan   Plan  Continue with current plan of care      Progression Toward Goals   Progression toward goals  Progressing toward goals       SLP Education - 12/04/17 1117    Education provided  Yes    Education Details  strategies to assist pt with homework     Person(s) Educated  Patient;Spouse    Methods  Explanation;Demonstration    Comprehension  Verbalized understanding       SLP Short Term Goals - 11/28/17 1157      SLP SHORT TERM GOAL #1   Title  Pt will ID object to spoken word f:3 8/10x with occasional min gestural cues.    Status  Achieved      SLP SHORT TERM GOAL #2   Title  Pt will ID object to written word f:4 with occasional min A 8/10x    Status  Achieved      SLP SHORT TERM GOAL #3   Title  Pt will name basic objects with sentence completion cues 8/10x    Status  Achieved      SLP SHORT TERM GOAL #4   Title  Pt will write biographical information/family names with occasional mod A 4/5x    Status  Partially Met  SLP SHORT TERM GOAL #5   Title  Spouse will utilize gestures and written word cues to communicate with pt in simple conversation of 3-4 turns with occasional min A.    Status  Not Met       SLP Long Term Goals - 12/04/17 1118      SLP LONG TERM GOAL #1   Title  Pt will ID object to spoken phrase/description f:3 8/10x    Time  6    Period  Weeks    Status  On-going      SLP LONG TERM GOAL #2   Title  Pt will ID object to written phrase f:4 8/10x and occasional min A    Time  6    Period  Weeks    Status  On-going      SLP LONG TERM GOAL #3   Title  Pt will name 5 items in a simple category with occasional min A.    Time  6    Period  Weeks    Status  On-going      SLP LONG TERM GOAL #4   Title  Pt will write 1-2 words in sentence completion task 8/10x with occasional min A    Time  6    Period  Weeks    Status  On-going      SLP LONG TERM GOAL #5   Title  Pt/spouse will utilize multimodal  communication for simple conversation of 4-5 turns with occasional min A over 2 sessions.     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 Progress on naming, writing, reading continues slowly.  Pt cont to use longer (3-4 word) utterances in converasation with rare to occasional content words (more if pt less frustrated with lack of verbalization). Continue skilled ST to maximize communication for improved independence.     Speech Therapy Frequency  3x / week    Treatment/Interventions  Internal/external aids;Compensatory strategies;Environmental controls;Compensatory techniques;Language facilitation;Cueing hierarchy;Multimodal communcation approach;Functional tasks;Patient/family education;SLP instruction and feedback;Cognitive reorganization    Potential Considerations  Severity of impairments    Consulted and Agree with Plan of Care  Patient;Family member/caregiver    Family Member Consulted  spouse [name]       Patient will benefit from skilled therapeutic intervention in order to improve the following deficits and impairments:   Aphasia  Apraxia    Problem List There are no active problems to display for this patient.   [facility] ,[place], [place]  12/04/2017, 11:19 AM  [facility] [address] [place], [place], [address] Phone: [phone]   Fax:  [phone]   Name: Mareon Robinette MRN: [id number] Date of Birth: [birth year]

## 2017-12-04 NOTE — Patient Instructions (Signed)
Write, say, spell, read the words.  Work for 30 minutes and then rest.

## 2017-12-05 ENCOUNTER — Encounter: Payer: PRIVATE HEALTH INSURANCE | Admitting: Speech Pathology

## 2017-12-05 ENCOUNTER — Encounter: Payer: PRIVATE HEALTH INSURANCE | Admitting: Occupational Therapy

## 2017-12-10 ENCOUNTER — Other Ambulatory Visit: Payer: Self-pay

## 2017-12-10 ENCOUNTER — Ambulatory Visit: Payer: Medicare Other | Admitting: Speech Pathology

## 2017-12-10 ENCOUNTER — Encounter: Payer: Self-pay | Admitting: Speech Pathology

## 2017-12-10 DIAGNOSIS — R4701 Aphasia: Secondary | ICD-10-CM

## 2017-12-10 NOTE — Therapy (Signed)
[facility] [address] [place], [place], [address] Phone: [phone]   Fax:  [phone]  Speech Language Pathology Treatment  Patient Details  Name: [name] MRN: [id number] Date of Birth: March 26, 1951 Referring Provider: Dr. Launa Grill   Encounter Date: 12/10/2017  End of Session - 12/10/17 1222    Visit Number  19    Number of Visits  24    Date for SLP Re-Evaluation  12/27/17    SLP Start Time  0934    SLP Stop Time   1016    SLP Time Calculation (min)  42 min    Activity Tolerance  Patient tolerated treatment well       History reviewed. No pertinent past medical history.  History reviewed. No pertinent surgical history.  There were no vitals filed for this visit.  Subjective Assessment - 12/10/17 0940    Subjective  Matthew Emmer' - I tried to do the [employer] but I just couldn't (re: [employer] homework)    Patient is accompained by:  Family member sister    Currently in Pain?  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 Pt IDed category of words (convergent categorization) with usual mod written, phonemic and cloze cues. Pt named 2 pictures with occasional min A and vebalized 1 similarity and 1 difference with consistent max written, questioning and verbal cues. Simple conversation re: [name]'s brother, 80% accurate in conveying his course of recovery.       Assessment / Recommendations / Plan   Plan  Continue with current plan of care      Progression Toward Goals   Progression  toward goals  Progressing toward goals         SLP Short Term Goals - 12/10/17 1221      SLP SHORT TERM GOAL #1   Title  Pt will ID object to spoken word f:3 8/10x with occasional min gestural cues.    Status  Achieved      SLP SHORT TERM GOAL #2   Title  Pt will ID object to written word f:4 with occasional min A 8/10x    Status  Achieved      SLP SHORT TERM GOAL #3   Title  Pt will name basic objects with sentence completion cues 8/10x    Status  Achieved      SLP SHORT TERM GOAL #4   Title  Pt will write biographical information/family names with occasional mod A 4/5x    Status  Partially Met      SLP SHORT TERM GOAL #5   Title  Spouse will utilize gestures and written word cues to communicate with pt in simple conversation of 3-4 turns with occasional min A.    Status  Not Met       SLP Long Term Goals - 12/10/17 1221      SLP LONG TERM GOAL #1   Title  Pt will ID object to spoken phrase/description f:3 8/10x    Time  5    Period  Weeks    Status  On-going      SLP LONG TERM GOAL #2   Title  Pt will ID object to written phrase f:4 8/10x and occasional min A    Time  5    Period  Weeks    Status  On-going      SLP LONG TERM GOAL #3   Title  Pt will name 5 items in a simple category with occasional min A.    Time  5    Period  Weeks    Status  On-going      SLP LONG TERM GOAL #4   Title  Pt will write 1-2 words in sentence completion task 8/10x with occasional min A    Time  5    Period  Weeks    Status  On-going      SLP LONG TERM GOAL #5   Title  Pt/spouse will utilize multimodal communication for simple conversation of 4-5 turns with occasional min A over 2 sessions.     Time  5    Period  Weeks    Status  On-going       Plan - 12/10/17 1218    Clinical Impression Statement  Pt with some improvement in simple conversation in context re: his brother in [place] d/c from hospital. Pt answered questions re: brother in law with 4-5 word utterances and  occasional questioning for clarification. Reading comprehension facilitated by pt reading list of 4 words and IDing which doesn't belong with mod A, Naming category (convergent naming) also with usual to frequent mod A. Nonfluent aphasia persists - continue skilled ST to maximize communication for QOL, independence.     Speech Therapy Frequency  3x / week    Treatment/Interventions  Internal/external aids;Compensatory strategies;Environmental controls;Compensatory techniques;Language facilitation;Cueing hierarchy;Multimodal communcation approach;Functional tasks;Patient/family education;SLP instruction and feedback;Cognitive reorganization    Potential to Achieve Goals  Good    Potential Considerations  Severity of impairments    Consulted and Agree with Plan of Care  Patient;Family member/caregiver    Family Member Consulted  sister       Patient will benefit from skilled therapeutic intervention in order to improve the following deficits and impairments:   Aphasia    Problem List There are no active problems to display for this patient.   [place], Annye Rusk MS, CCC-SLP 12/10/2017, 12:23 PM  [facility] [address] [place], [place], [address] Phone: [phone]   Fax:  [phone]   Name: [name] MRN: [id number] Date of Birth: 04/26/1951

## 2017-12-11 ENCOUNTER — Ambulatory Visit: Payer: Medicare Other

## 2017-12-11 ENCOUNTER — Other Ambulatory Visit: Payer: Self-pay

## 2017-12-11 DIAGNOSIS — R4701 Aphasia: Secondary | ICD-10-CM

## 2017-12-11 DIAGNOSIS — R482 Apraxia: Secondary | ICD-10-CM

## 2017-12-11 NOTE — Therapy (Signed)
[facility] [address] [place], [place], [address] Phone: [phone]   Fax:  [phone]  Speech Language Pathology Treatment  Patient Details  Name: [name] MRN: [id number] Date of Birth: 1951/11/30 Referring Provider: Dr. Launa Grill   Encounter Date: 12/11/2017  End of Session - 12/11/17 2213    Visit Number  20    Number of Visits  36    Date for SLP Re-Evaluation  02/21/18    SLP Start Time  1148    SLP Stop Time   1230    SLP Time Calculation (min)  42 min    Activity Tolerance  Patient tolerated treatment well       History reviewed. No pertinent past medical history.  History reviewed. No pertinent surgical history.  There were no vitals filed for this visit.  Subjective Assessment - 12/11/17 1155    Subjective  "It's a good day today."    Patient is accompained by:  Family member wife    Currently in Pain?  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 "One word" for a picture completed with questionable understanding of task from pt - took pt 2-3 trials prior to demonstrating understanding of task. Once he understood task pt generated a word or 2-3 word phrase about the picture 70% of the time, improved with written cues. Pt sometimes able to spell his target word if less than 4-5 letters. Pt's wife stated she was very happy with the incr'd success pt has with verbalizing with gesture, written,and ppicture cues from listener.       Assessment /  Recommendations / Plan   Plan  Continue with current plan of care      Progression Toward Goals   Progression toward goals  Progressing toward goals         SLP Short Term Goals - 12/10/17 1221      SLP SHORT TERM GOAL #1   Title  Pt will ID object to spoken word f:3 8/10x with occasional min gestural cues.    Status  Achieved      SLP SHORT TERM GOAL #2   Title  Pt will ID object to written word f:4 with occasional min A 8/10x    Status  Achieved      SLP SHORT TERM GOAL #3   Title  Pt will name basic objects with sentence completion cues 8/10x    Status  Achieved      SLP SHORT TERM GOAL #4   Title  Pt will write biographical information/family names with occasional mod A 4/5x    Status  Partially Met      SLP SHORT TERM GOAL #5   Title  Spouse will utilize gestures and written word cues to communicate with pt in simple conversation of 3-4 turns with occasional min A.    Status  Not Met       SLP Long Term Goals - 12/11/17 2216      SLP  LONG TERM GOAL #1   Title  Pt will ID object to spoken phrase/description f:3 8/10x    Time  8    Period  Weeks    Status  Not Met and ongoing      SLP LONG TERM GOAL #2   Title  Pt will ID object to written phrase f:4 8/10x and occasional min A    Time  8    Period  Weeks    Status  Partially Met and ongoing      SLP LONG TERM GOAL #3   Title  Pt will name 5 items in a simple category with occasional min A.    Time  8    Period  Weeks    Status  Not Met and ongoing      SLP LONG TERM GOAL #4   Title  Pt will write 1-2 words in sentence completion task 8/10x with occasional min A    Time  8    Period  Weeks    Status  Not Met and ongoing      SLP LONG TERM GOAL #5   Title  Pt/spouse will utilize multimodal communication for simple conversation of 4-5 turns with occasional min A over 2 sessions.     Time  8    Period  Weeks    Status  Not Met and ongoing       Plan - 2018/01/05 03-17-2212    Clinical Impression Statement   Pt with minor noted improvement in simple conversation in context, compared to last session SLP saw pt. Pt's verbal expression is helped when pt can use written, gestured, and/or picture cues. See "skilled intervention" for details. Pt has had 20 visits in 8 weeks, and due to nonfluent aphasia persisting, continue skilled ST is warranted to maximize communication for QOL, independence.     Speech Therapy Frequency  3x / week    Duration  -- 8 weeks    Treatment/Interventions  Internal/external aids;Compensatory strategies;Environmental controls;Compensatory techniques;Language facilitation;Cueing hierarchy;Multimodal communcation approach;Functional tasks;Patient/family education;SLP instruction and feedback;Cognitive reorganization    Potential to Achieve Goals  Good    Potential Considerations  Severity of impairments    Consulted and Agree with Plan of Care  Patient;Family member/caregiver    Family Member Consulted  sister       Patient will benefit from skilled therapeutic intervention in order to improve the following deficits and impairments:   Aphasia  Apraxia  G-Codes - 2018-01-05 03-17-2218    Functional Assessment Tool Used  NOMS    Functional Limitations  Spoken language expressive    Spoken Language Expression Current Status [phone])  At least 60 percent but less than 80 percent impaired, limited or restricted    Spoken Language Expression Goal Status [phone])  At least 40 percent but less than 60 percent impaired, limited or restricted        Speech Therapy Progress Note  Dates of Reporting Period: 11-02-17 to present  Subjective Statement: Pt has been seen for 20 visits skilled ST focusing on pt's verbal receptive and expressive language.   Objective Measurements: Pt's simple conversation is not yet functional  Goal Update: See above. LTGs updated.  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  Reason Skilled Services are Required: Pt's simple language/wants/needs is only nearly- functional.   Problem List There are no active problems to display for this patient.   [facility] ,  MS, CCC-SLP 12/11/2017, 10:19 PM  [facility] [address] [place], [place], [address] Phone: [phone]   Fax:  [phone]   Name: [name] MRN: [id number] Date of Birth: June 20, 1951

## 2017-12-11 NOTE — Patient Instructions (Signed)
  Please complete the assigned speech therapy homework prior to your next session and return it to the speech therapist at your next visit.  

## 2017-12-25 ENCOUNTER — Ambulatory Visit: Payer: Medicare Other

## 2017-12-26 ENCOUNTER — Encounter: Payer: PRIVATE HEALTH INSURANCE | Admitting: Speech Pathology

## 2017-12-27 ENCOUNTER — Ambulatory Visit: Payer: Medicare Other | Attending: Physical Medicine and Rehabilitation

## 2017-12-27 ENCOUNTER — Other Ambulatory Visit: Payer: Self-pay

## 2017-12-27 DIAGNOSIS — R4701 Aphasia: Secondary | ICD-10-CM

## 2017-12-27 DIAGNOSIS — R482 Apraxia: Secondary | ICD-10-CM | POA: Insufficient documentation

## 2017-12-27 NOTE — Patient Instructions (Signed)
  Please complete the assigned speech therapy homework prior to your next session and return it to the speech therapist at your next visit.  

## 2017-12-27 NOTE — Therapy (Signed)
[facility] [address] [place], [place], [address] Phone: [phone]   Fax:  [phone]  Speech Language Pathology Treatment  Patient Details  Name: [name] MRN: [id number] Date of Birth: 03-May-1951 Referring Provider: Dr. Launa Grill   Encounter Date: 12/27/2017  End of Session - 12/27/17 1637    Visit Number  21    Number of Visits  36    Date for SLP Re-Evaluation  02/21/18    SLP Start Time  1318    SLP Stop Time   1400    SLP Time Calculation (min)  42 min    Activity Tolerance  Patient tolerated treatment well       History reviewed. No pertinent past medical history.  History reviewed. No pertinent surgical history.  There were no vitals filed for this visit.  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 "I -set it --aside, and kept --setting it aside." Pt only completed what he could do successfully, per wife. Wife having difficulty wiht motivating pt for ST work at home. REceptive language/reading comprehension at sentence level targeted today, to have something practical for pt to work on (whether or not a set of coins equal another set of coins)- pt with extra time necessary consistently, 90% success with use of manipulatives (fake coins/bills). 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    Status  Achieved      SLP SHORT TERM GOAL #2   Title  Pt will ID object to written word f:4 with occasional min A 8/10x    Status  Achieved      SLP SHORT TERM GOAL #3   Title  Pt will name basic objects with sentence completion cues 8/10x    Status  Achieved      SLP SHORT TERM GOAL #4   Title  Pt will write biographical information/family names with occasional mod A 4/5x    Status  Partially Met      SLP SHORT TERM GOAL #5   Title  Spouse will utilize gestures and written word cues to communicate with pt in simple conversation of 3-4 turns with occasional min A.    Status  Not Met  SLP Long Term Goals - 12/27/17 1646      SLP LONG TERM GOAL #1   Title  Pt will ID object to spoken phrase/description f:3 8/10x    Time  8    Period  Weeks    Status  On-going and ongoing      SLP LONG TERM GOAL #2   Title  Pt will ID object to written phrase f:4 8/10x and occasional min A    Time  8    Period  Weeks    Status  On-going and ongoing      SLP LONG TERM GOAL #3   Title  Pt will name 5 items in a simple category with occasional min A.    Time  8    Period  Weeks    Status  On-going and ongoing      SLP LONG TERM GOAL #4   Title  Pt will write 1-2 words in sentence completion task 8/10x with occasional min A    Time  8    Period  Weeks    Status  On-going and ongoing      SLP LONG TERM GOAL #5   Title  Pt/spouse will utilize multimodal communication for simple conversation of 4-5 turns with occasional min A over 2 sessions.     Time  8    Period  Weeks    Status   On-going and ongoing       Plan - 12/27/17 1637    Clinical Impression Statement  Pt simple conversation in context appears primarilyi unchanged, compared to last session SLP saw pt, two weeks ago. Pt's verbal expression is helped when pt can use written, gestured, and/or picture cues. See "skilled intervention" for details. Due to persistent severe nonfluent aphasia, continued skilled ST is warranted to maximize communication for QOL, independence.     Speech Therapy Frequency  2x / week    Duration  -- 8 weels (or 36 total visits)    Treatment/Interventions  Internal/external aids;Compensatory strategies;Environmental controls;Compensatory techniques;Language facilitation;Cueing hierarchy;Multimodal communcation approach;Functional tasks;Patient/family education;SLP instruction and feedback;Cognitive reorganization    Potential to Achieve Goals  Fair    Potential Considerations  Severity of impairments;Cooperation/participation level       Patient will benefit from skilled therapeutic intervention in order to improve the following deficits and impairments:   Aphasia  Apraxia    Problem List There are no active problems to display for this patient.   [facility] ,[place], [place]  12/27/2017, 4:47 PM  [facility] [address] [place], [place], [address] Phone: [phone]   Fax:  [phone]   Name: [name] MRN: [id number] Date of Birth: [birth year]

## 2018-01-06 ENCOUNTER — Ambulatory Visit: Payer: Medicare Other | Admitting: Speech Pathology

## 2018-01-09 ENCOUNTER — Ambulatory Visit: Payer: Medicare Other

## 2018-01-09 DIAGNOSIS — R4701 Aphasia: Secondary | ICD-10-CM | POA: Diagnosis not present

## 2018-01-09 DIAGNOSIS — R482 Apraxia: Secondary | ICD-10-CM

## 2018-01-09 NOTE — Patient Instructions (Signed)
  Please complete the assigned speech therapy homework prior to your next session and return it to the speech therapist at your next visit.  

## 2018-01-09 NOTE — Therapy (Signed)
[facility] [address] [place], [place], [address] Phone: [phone]   Fax:  [phone]  Speech Language Pathology Treatment  Patient Details  Name: [name] MRN: [id number] Date of Birth: 1951-10-14 Referring Provider: Dr. Launa Grill   Encounter Date: 01/09/2018  End of Session - 01/09/18 1642    Visit Number  22    Number of Visits  36    Date for SLP Re-Evaluation  02/21/18    SLP Start Time  9024    SLP Stop Time   0973    SLP Time Calculation (min)  45 min       History reviewed. No pertinent past medical history.  History reviewed. No pertinent surgical history.  There were no vitals filed for this visit.  Subjective Assessment - 01/09/18 1548    Subjective  Wife reports pt expressive language/speech has declined since last visit.    Patient is accompained by:  Family member wife    Currently in Pain?  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 SLP had pt read "wh" questions and pick answer from f:3 - 65% success with min cues occasionally. Pt notably frustrated, sighing heavily, putting hand on forehead, and glancing at wife. Pt with body language indicating frustration about inability to read questions out loud: SLP told pt not to focus on inaccuracy of oral reading but focus on reading comprehension.       Assessment / Recommendations / Plan   Plan  Continue with current plan of care      Progression Toward Goals   Progression  toward goals  Progressing toward goals pt's frustration hinders progress       SLP Education - 01/09/18 1627    Education provided  Yes    Education Details  intensive therapy program (4-5 days/week) may be helpful for pt    Person(s) Educated  Patient;Spouse    Methods  Explanation    Comprehension  Verbalized understanding       SLP Short Term Goals - 12/27/17 1646      SLP SHORT TERM GOAL #1   Title  Pt will ID object to spoken word f:3 8/10x with occasional min gestural cues.    Status  Achieved      SLP SHORT TERM GOAL #2   Title  Pt will ID object to written word f:4 with occasional min A 8/10x    Status  Achieved      SLP SHORT TERM GOAL #3   Title  Pt will name basic objects with sentence completion cues 8/10x    Status  Achieved      SLP SHORT TERM GOAL #4   Title  Pt will write biographical information/family names with occasional mod A 4/5x    Status  Partially Met      SLP SHORT TERM GOAL #5   Title  Spouse will utilize gestures and written word cues to communicate with pt in simple conversation of  3-4 turns with occasional min A.    Status  Not Met       SLP Long Term Goals - 01/09/18 1644      SLP LONG TERM GOAL #1   Title  Pt will ID object to spoken phrase/description f:3 8/10x    Time  7    Period  Weeks    Status  On-going and ongoing      SLP LONG TERM GOAL #2   Title  Pt will ID object to written phrase f:4 8/10x and occasional min A    Time  7    Period  Weeks    Status  On-going and ongoing      SLP LONG TERM GOAL #3   Title  Pt will name 5 items in a simple category with occasional min A.    Time  7    Period  Weeks    Status  On-going and ongoing      SLP LONG TERM GOAL #4   Title  Pt will write 1-2 words in sentence completion task 8/10x with occasional min A    Time  7    Period  Weeks    Status  On-going and ongoing      SLP LONG TERM GOAL #5   Title  Pt/spouse will utilize multimodal communication for simple conversation of  4-5 turns with occasional min A over 2 sessions.     Time  7    Period  Weeks    Status  On-going and ongoing       Plan - 01/09/18 1642    Clinical Impression Statement  Wife states pt's conversational speech has declined, however SLP believes today pt's simple conversation in context appears again primarily unchanged from last session SLP saw pt. Worked mostly on reading comprehension today. See "skilled intervention" for details. Due to persistent severe nonfluent aphasia, continued skilled ST is warranted to maximize communication for QOL, independence.     Speech Therapy Frequency  2x / week    Duration  -- 8 weeks/36 total visits    Treatment/Interventions  Internal/external aids;Compensatory strategies;Environmental controls;Compensatory techniques;Language facilitation;Cueing hierarchy;Multimodal communcation approach;Functional tasks;Patient/family education;SLP instruction and feedback;Cognitive reorganization    Potential to Achieve Goals  Fair    Potential Considerations  Severity of impairments;Cooperation/participation level       Patient will benefit from skilled therapeutic intervention in order to improve the following deficits and impairments:   Aphasia  Apraxia    Problem List There are no active problems to display for this patient.   [facility] ,[place], [place]  01/09/2018, 4:45 PM  [facility] [address] [place], [place], [address] Phone: [phone]   Fax:  [phone]   Name: Bobak Oguinn MRN: [id number] Date of Birth: 1951-08-24

## 2018-01-09 NOTE — Addendum Note (Signed)
Addended by: Verdie MosherSCHINKE, Verlee Pope B on: 01/09/2018 03:51 PM   Modules accepted: Orders

## 2018-01-16 ENCOUNTER — Ambulatory Visit: Payer: Medicare Other | Admitting: Speech Pathology

## 2018-01-16 DIAGNOSIS — R4701 Aphasia: Secondary | ICD-10-CM

## 2018-01-16 DIAGNOSIS — R482 Apraxia: Secondary | ICD-10-CM

## 2018-01-16 NOTE — Therapy (Signed)
[facility] [address] [place], [place], [address] Phone: [phone]   Fax:  [phone]  Speech Language Pathology Treatment  Patient Details  Name: [name] MRN: [id number] Date of Birth: 08/11/1951 Referring Provider: Dr. Jairon Downs   Encounter Date: 01/16/2018  End of Session - 01/16/18 1104    Visit Number  23    Number of Visits  36    Date for SLP Re-Evaluation  02/21/18    SLP Start Time  0933    SLP Stop Time   1020    SLP Time Calculation (min)  47 min    Activity Tolerance  Patient tolerated treatment well       No past medical history on file.  No past surgical history on file.  There were no vitals filed for this visit.  Subjective Assessment - 01/16/18 1047    Subjective  "He has blown me away this week - he is doing better"    Patient is accompained by:  Family member [name], spouse    Currently in Pain?  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 He required gesture cues, 1st letter cues and /or written cues (with blanks) to name remaining 6 items. Overall processing quite slow for auditory comprehension. Written expression facilitated at word level, having pt fill in blanks for items after he named them, with frequent mod verbal cues - pt verbalized correct spelling several times, however wrote the wrong letter with max A for awareness.       Assessment / Recommendations / Plan   Plan  Continue with current plan of care      Progression Toward Goals   Progression toward  goals  Progressing toward goals       SLP Education - 01/16/18 1100    Education provided  Yes    Education Details  provided handouts for residential aphasia programs (from [facility]); specific acitivies to target auditory comprehension at home    Person(s) Educated  Patient;Spouse    Methods  Explanation    Comprehension  Verbalized understanding       SLP Short Term Goals - 01/16/18 1103      SLP SHORT TERM GOAL #1   Title  Pt will ID object to spoken word f:3 8/10x with occasional min gestural cues.    Status  Achieved      SLP SHORT TERM GOAL #2   Title  Pt will ID object to written word f:4 with occasional min A 8/10x    Status  Achieved      SLP SHORT TERM GOAL #3   Title  Pt will name basic objects with sentence completion cues 8/10x    Status  Achieved      SLP SHORT TERM GOAL #4   Title  Pt will write biographical information/family names with occasional mod A 4/5x    Status  Partially Met      SLP SHORT TERM GOAL #5   Title  Spouse will utilize gestures and written word cues to   communicate with pt in simple conversation of 3-4 turns with occasional min A.    Status  Not Met       SLP Long Term Goals - 01/16/18 1103      SLP LONG TERM GOAL #1   Title  Pt will ID object to spoken phrase/description f:3 8/10x    Time  6    Period  Weeks    Status  On-going and ongoing      SLP LONG TERM GOAL #2   Title  Pt will ID object to written phrase f:4 8/10x and occasional min A    Time  6    Period  Weeks    Status  On-going and ongoing      SLP LONG TERM GOAL #3   Title  Pt will name 5 items in a simple category with occasional min A.    Time  6    Period  Weeks    Status  On-going and ongoing      SLP LONG TERM GOAL #4   Title  Pt will write 1-2 words in sentence completion task 8/10x with occasional min A    Time  6    Period  Weeks    Status  On-going and ongoing      SLP LONG TERM GOAL #5   Title  Pt/spouse will utilize multimodal communication for  simple conversation of 4-5 turns with occasional min A over 2 sessions.     Time  6    Period  Weeks    Status  On-going and ongoing       Plan - 01/16/18 1100    Clinical Impression Statement  Spouse requesting pt come 3x a week. Pt has demonstrated some frustration with progress. When I discussed this with pt, he elects to continue 2x a week. Auditory comprehension and naming today with mod to max A. Pt is making progress as we have increased the difficulty of tasks. Significant aphasia remains, affecting pt's ability to communicate with his family and in his work. Continue skilled ST to maximize communication for improved independence.     Speech Therapy Frequency  2x / week    Treatment/Interventions  Internal/external aids;Compensatory strategies;Environmental controls;Compensatory techniques;Language facilitation;Cueing hierarchy;Multimodal communcation approach;Functional tasks;Patient/family education;SLP instruction and feedback;Cognitive reorganization    Consulted and Agree with Plan of Care  Patient;Family member/caregiver    Family Member Consulted  spouse       Patient will benefit from skilled therapeutic intervention in order to improve the following deficits and impairments:   Aphasia  Apraxia    Problem List There are no active problems to display for this patient.   Lovvorn, Annye Rusk MS, CCC-SLP 01/16/2018, 12:10 PM  [facility] [address] [place], [place], [address] Phone: [phone]   Fax:  [phone]   Name: [name] MRN: [id number] Date of Birth: 1951-11-21

## 2018-01-16 NOTE — Patient Instructions (Signed)
  Auditory comprehension  - [name] is not understanding everything people are saying or asking  Reading comprehension is similar to auditory comprehension  Describe simple objects and have [name] guess  Written expression and verbal expression (writing and talking) are similar

## 2018-01-21 ENCOUNTER — Ambulatory Visit: Payer: Medicare Other | Admitting: Speech Pathology

## 2018-01-21 ENCOUNTER — Encounter: Payer: Self-pay | Admitting: Speech Pathology

## 2018-01-21 DIAGNOSIS — R4701 Aphasia: Secondary | ICD-10-CM | POA: Diagnosis not present

## 2018-01-21 DIAGNOSIS — R482 Apraxia: Secondary | ICD-10-CM

## 2018-01-21 NOTE — Therapy (Signed)
[facility] [address] [place], [place], [address] Phone: [phone]   Fax:  [phone]  Speech Language Pathology Treatment  Patient Details  Name: [name] MRN: [id number] Date of Birth: [birth year] Referring Provider: Dr. Launa Grill   Encounter Date: 01/21/2018  End of Session - 01/21/18 1213    Visit Number  24    Number of Visits  36    Date for SLP Re-Evaluation  02/21/18    SLP Start Time  1017    SLP Stop Time   1100    SLP Time Calculation (min)  43 min    Activity Tolerance  Patient tolerated treatment well       History reviewed. No pertinent past medical history.  History reviewed. No pertinent surgical history.  There were no vitals filed for this visit.  Subjective Assessment - 01/21/18 1158    Subjective  "I been doing a lot"    Patient is accompained by:  Family member    Currently in Pain?  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 . After verb/object, I instructed pt to use a complete sentence with subject which required frequent mod A and signiicant extended time.  Written expression facilitated having pt write names of objects (simple) in environment with frequent mod A, perseveration. Spouse reported pt initiatlly had difficulty with his homework ([place] words in a given category) however he progressed to where this task was relatively easy for him, indicating good carryover of homework      Assessment /  Recommendations / [place] with current plan of care      Progression Toward Goals   Progression toward goals  Progressing toward goals         SLP Short Term Goals - 01/21/18 1208      SLP SHORT TERM GOAL #1   Title  Pt will ID object to spoken word f:3 8/10x with occasional min gestural cues.    Status  Achieved      SLP SHORT TERM GOAL #2   Title  Pt will ID object to written word f:4 with occasional min A 8/10x    Status  Achieved      SLP SHORT TERM GOAL #3   Title  Pt will name basic objects with sentence completion cues 8/10x    Status  Achieved      SLP SHORT TERM GOAL #4   Title  Pt will write biographical information/family names with occasional mod A 4/5x    Status  Partially Met      SLP SHORT TERM GOAL #5   Title  Spouse will utilize gestures and written word cues to communicate with pt in simple conversation of 3-4 turns with occasional min A.    Status  Not Met       SLP Long Term Goals - 01/21/18 1209      SLP LONG TERM GOAL #1   Title  Pt  will ID object to spoken phrase/description f:3 8/10x    Time  5    Period  Weeks    Status  On-going and ongoing      SLP LONG TERM GOAL #2   Title  Pt will ID object to written phrase f:4 8/10x and occasional min A    Time  5    Period  Weeks    Status  On-going and ongoing      SLP LONG TERM GOAL #3   Title  Pt will name 5 items in a simple category with occasional min A.    Time  5    Period  Weeks    Status  On-going and ongoing      SLP LONG TERM GOAL #4   Title  Pt will write 1-2 words in sentence completion task 8/10x with occasional min A    Time  5    Period  Weeks    Status  On-going and ongoing      SLP LONG TERM GOAL #5   Title  Pt/spouse will utilize multimodal communication for simple conversation of 4-5 turns with occasional min A over 2 sessions.     Time  6    Period  Weeks    Status  On-going and ongoing       Plan - 01/21/18 1205    Clinical Impression Statement   Pt continues to demonstrate slow, incremental progress, generating simple present progressive verb and object in structured task with extended time and usual min to mod A. Written expression has progressed from biographical information (name and address) to frequent mod A with simple words. Continue skilled ST to maximize communication for independence and to reduce caregiver burden.     Speech Therapy Frequency  2x / week    Treatment/Interventions  Internal/external aids;Compensatory strategies;Environmental controls;Compensatory techniques;Language facilitation;Cueing hierarchy;Multimodal communcation approach;Functional tasks;Patient/family education;SLP instruction and feedback;Cognitive reorganization    Potential to Achieve Goals  Fair    Potential Considerations  Severity of impairments;Cooperation/participation level       Patient will benefit from skilled therapeutic intervention in order to improve the following deficits and impairments:   Aphasia  Apraxia    Problem List There are no active problems to display for this patient.   Kamaree Berkel, Annye Rusk MS, CCC-SLP 01/21/2018, 12:16 PM  [facility] [address] [place], [place], [address] Phone: [phone]   Fax:  [phone]   Name: [name] MRN: [id number] Date of Birth: 1951-04-10

## 2018-01-23 ENCOUNTER — Ambulatory Visit: Payer: Medicare Other | Admitting: Speech Pathology

## 2018-01-23 DIAGNOSIS — R482 Apraxia: Secondary | ICD-10-CM

## 2018-01-23 DIAGNOSIS — R4701 Aphasia: Secondary | ICD-10-CM

## 2018-01-23 NOTE — Therapy (Signed)
[facility] [address] [place], [place], [address] Phone: [phone]   Fax:  [phone]  Speech Language Pathology Treatment  Patient Details  Name: [name] MRN: [id number] Date of Birth: October 27, 1951 Referring Provider: Dr. Launa Grill   Encounter Date: 01/23/2018  End of Session - 01/23/18 1506    Visit Number  25    Number of Visits  36    Date for SLP Re-Evaluation  02/21/18    SLP Start Time  0300    SLP Stop Time   1400    SLP Time Calculation (min)  43 min       No past medical history on file.  No past surgical history on file.  There were no vitals filed for this visit.  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 Pt ID'd related photos f:4 to written description 8/10 times with rare min A (pointing to salient words). Pt named 4 items in simple categories (drinks, clothes) with occasional min A. He named 5-6 items with semantic cues, 1st letter cues. Verbal expression facilittated generating subject, verb object sentences with verb cards with frequent mod questioning cues, semantic cues and visual cues. Occasional modeling/repetition      Assessment / Recommendations / Plan   Plan  Continue with current plan of care      Progression Toward Goals   Progression toward goals  Progressing toward goals         SLP Short Term Goals - 01/23/18 1505      SLP SHORT TERM GOAL #1   Title  Pt will ID object to spoken word f:3 8/10x with occasional min gestural cues.    Status  Achieved      SLP SHORT TERM GOAL #2   Title  Pt will ID object to written word f:4 with occasional min A 8/10x    Status  Achieved      SLP SHORT TERM GOAL #3   Title  Pt will name basic objects with sentence completion cues 8/10x    Status  Achieved      SLP SHORT TERM GOAL #4   Title  Pt will write biographical information/family names with occasional mod A 4/5x    Status  Partially Met      SLP SHORT TERM GOAL #5   Title  Spouse will utilize gestures and written word cues to communicate with pt in simple conversation of 3-4 turns with occasional min A.    Status  Not Met       SLP Long Term Goals - 01/23/18 1505      SLP LONG TERM GOAL #1   Title  Pt will ID object to spoken phrase/description f:3 8/10x    Time  5    Period  Weeks    Status  Achieved and ongoing      SLP LONG  TERM GOAL #2   Title  Pt will ID object to written phrase f:4 8/10x and occasional min A    Time  5    Period  Weeks    Status  Achieved and ongoing      SLP LONG TERM GOAL #3   Title  Pt will name 5 items in a simple category with occasional min A.    Time  5    Period  Weeks    Status  On-going and ongoing      SLP LONG TERM GOAL #4   Title  Pt will write 1-2 words in sentence completion task 8/10x with occasional min A    Time  5    Period  Weeks    Status  On-going and ongoing      SLP LONG TERM GOAL #5   Title  Pt/spouse will utilize multimodal communication for simple conversation of 4-5 turns with occasional min A over 2 sessions.     Time  6    Period  Weeks    Status  On-going and ongoing      Additional Long Term Goals   Additional Long Term Goals  Yes      SLP LONG TERM GOAL #6   Title  Pt will verbalize subject, verb object sentence in structured task with occasional min A 7/10x    Time  6    Period  Weeks    Status  New       Plan - 01/23/18 1503    Clinical Impression  Statement  Pt continues to demonstrate slow, incremental progress, generating simple present progressive verb and object in structured task with extended time and usual min to mod A. Written expression has progressed from biographical information (name and address) to frequent mod A with simple words. Continue skilled ST to maximize communication for independence and to reduce caregiver burden.     Speech Therapy Frequency  2x / week    Treatment/Interventions  Internal/external aids;Compensatory strategies;Environmental controls;Compensatory techniques;Language facilitation;Cueing hierarchy;Multimodal communcation approach;Functional tasks;Patient/family education;SLP instruction and feedback;Cognitive reorganization       Patient will benefit from skilled therapeutic intervention in order to improve the following deficits and impairments:   Aphasia  Apraxia    Problem List There are no active problems to display for this patient.   Lovvorn, Annye Rusk MS, CCC-SLP 01/23/2018, 3:07 PM  [facility] [address] [place], [place], [address] Phone: [phone]   Fax:  [phone]   Name: [name] MRN: [id number] Date of Birth: 12/22/1951

## 2018-01-28 ENCOUNTER — Encounter: Payer: Self-pay | Admitting: Speech Pathology

## 2018-01-28 ENCOUNTER — Ambulatory Visit: Payer: Medicare Other | Attending: Physical Medicine and Rehabilitation | Admitting: Speech Pathology

## 2018-01-28 DIAGNOSIS — R482 Apraxia: Secondary | ICD-10-CM | POA: Diagnosis present

## 2018-01-28 DIAGNOSIS — R4701 Aphasia: Secondary | ICD-10-CM | POA: Insufficient documentation

## 2018-01-28 NOTE — Therapy (Signed)
[facility] [address] [place], [place], [address] Phone: [phone]   Fax:  [phone]  Speech Language Pathology Treatment  Patient Details  Name: Rodolph Hagemann MRN: [id number] Date of Birth: 06-Feb-1951 Referring Provider: Dr. Launa Grill   Encounter Date: 01/28/2018  End of Session - 01/28/18 1703    Visit Number  26    Number of Visits  36    Date for SLP Re-Evaluation  02/21/18    SLP Start Time  8882    SLP Stop Time   1448    SLP Time Calculation (min)  45 min    Activity Tolerance  Patient tolerated treatment well       History reviewed. No pertinent past medical history.  History reviewed. No pertinent surgical history.  There were no vitals filed for this visit.  Subjective Assessment - 01/28/18 1656    Subjective  "A girl was visiting him in his shop and said she didn't know he had a stroke - he told her everything"    Patient is accompained by:  Family member    Currently in Pain?  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 Pt required some extended time and 1st letter, semantic or cloze cues occasionally. However, when asked to put the words in a sentnece, he required imitation cues. Naming car parts and describing process to rebuild a classic car and explaining the article written about him restoring a car with occasional requests for clarification due to aphaisic errors      Assessment / Recommendations / [place] with current plan of care      Progression Toward Goals   Progression toward goals  Progressing toward goals          SLP Short Term Goals - 01/28/18 1702      SLP SHORT TERM GOAL #1   Title  Pt will ID object to spoken word f:3 8/10x with occasional min gestural cues.    Status  Achieved      SLP SHORT TERM GOAL #2   Title  Pt will ID object to written word f:4 with occasional min A 8/10x    Status  Achieved      SLP SHORT TERM GOAL #3   Title  Pt will name basic objects with sentence completion cues 8/10x    Status  Achieved      SLP SHORT TERM GOAL #4   Title  Pt will write biographical information/family names with occasional mod A 4/5x    Status  Partially Met      SLP SHORT TERM GOAL #5   Title  Spouse will utilize gestures and written word cues to communicate with pt in simple conversation of 3-4 turns with occasional min A.    Status  Not Met       SLP Long Term Goals - 01/28/18 1702      SLP LONG TERM GOAL #1   Title  Pt will ID object to spoken phrase/description f:3 8/10x    Time  4    Period  Weeks    Status  Achieved and ongoing      SLP LONG TERM GOAL #2   Title  Pt will ID object to written phrase f:4 8/10x and occasional min A    Time  4    Period  Weeks    Status  Achieved and ongoing      SLP LONG TERM GOAL #3   Title  Pt will name 5 items in a simple category with occasional min A.    Time  4    Period  Weeks    Status  On-going and ongoing      SLP LONG TERM GOAL #4   Title  Pt will write 1-2 words in sentence completion task 8/10x with occasional min A    Time  4    Period  Weeks    Status  On-going and ongoing      SLP LONG TERM GOAL #5   Title  Pt/spouse will utilize multimodal communication for simple conversation of 4-5 turns with occasional min A over 2 sessions.     Time  6    Period  Weeks    Status  Achieved and ongoing      SLP LONG TERM GOAL #6   Title  Pt will verbalize subject, verb object sentence in structured task with occasional min A 7/10x    Time  4    Period  Weeks    Status  On-going       Plan - 01/28/18 1701     Clinical Impression Statement  Pt continues to demonstrate slow, incremental progress, generating simple present progressive verb and object in structured task with extended time and usual min to mod A. Written expression has progressed from biographical information (name and address) to frequent mod A with simple words. Continue skilled ST to maximize communication for independence and to reduce caregiver burden.     Speech Therapy Frequency  2x / week    Treatment/Interventions  Internal/external aids;Compensatory strategies;Environmental controls;Compensatory techniques;Language facilitation;Cueing hierarchy;Multimodal communcation approach;Functional tasks;Patient/family education;SLP instruction and feedback;Cognitive reorganization    Potential to Achieve Goals  Fair    Potential Considerations  Severity of impairments;Cooperation/participation level    Consulted and Agree with Plan of Care  Patient;Family member/caregiver    Family Member Consulted  spouse       Patient will benefit from skilled therapeutic intervention in order to improve the following deficits and impairments:   Aphasia  Apraxia    Problem List There are no active problems to display for this patient.   Lillan Mccreadie, Annye Rusk MS, CCC-SLP 01/28/2018, 5:04 PM  [facility] [address] [place], [place], [address] Phone: [phone]   Fax:  [phone]   Name: Tyrease Vandeberg MRN: [id number] Date of Birth: [birth year]

## 2018-01-30 ENCOUNTER — Encounter: Payer: Self-pay | Admitting: Speech Pathology

## 2018-01-30 ENCOUNTER — Ambulatory Visit: Payer: Medicare Other | Admitting: Speech Pathology

## 2018-01-30 DIAGNOSIS — R482 Apraxia: Secondary | ICD-10-CM

## 2018-01-30 DIAGNOSIS — R4701 Aphasia: Secondary | ICD-10-CM

## 2018-01-30 NOTE — Therapy (Addendum)
[facility] [address] [place], [place], [address] Phone: [phone]   Fax:  [phone]  Speech Language Pathology Treatment  Patient Details  Name: [name] MRN: [id number] Date of Birth: December 28, 1950 Referring Provider: Dr. Launa Grill   Encounter Date: 01/30/2018  End of Session - 01/30/18 1748    SLP Start Time  59    SLP Stop Time   1448    SLP Time Calculation (min)  46 min    Activity Tolerance  Patient tolerated treatment well       History reviewed. No pertinent past medical history.  History reviewed. No pertinent surgical history.  There were no vitals filed for this visit.  Subjective Assessment - 01/30/18 1406    Subjective  "I just got a call, my friend got called to [facility]"    Patient is accompained by:  Family member    Currently in Pain?  No/denies            ADULT SLP TREATMENT - 01/30/18 1407      General Information   Behavior/Cognition  Alert;Cooperative;Requires cueing      Treatment Provided   Treatment provided  Cognitive-Linquistic      Cognitive-Linquistic Treatment   Treatment focused on  Aphasia;Apraxia    Skilled Treatment  Word finding (naming) and verbal expression targeted naming items and actions in pictures - written cues to generate short present progressive senenteces with frequent mod A. Written expression at word level writing names of basic objects (3-5 letters) with frequent written cues to fill in missing letters and max A occasionally of telling pt the missing letters. Frustration noted      Assessment / Recommendations / Plan   Plan  Continue with current plan of care      Progression Toward Goals   Progression toward goals  Progressing toward goals         SLP Short Term Goals - 01/30/18 1746      SLP SHORT TERM GOAL #1   Title  Pt will ID object to spoken word f:3 8/10x with occasional min gestural cues.    Status  Achieved      SLP  SHORT TERM GOAL #2   Title  Pt will ID object to written word f:4 with occasional min A 8/10x    Status  Achieved      SLP SHORT TERM GOAL #3   Title  Pt will name basic objects with sentence completion cues 8/10x    Status  Achieved      SLP SHORT TERM GOAL #4   Title  Pt will write biographical information/family names with occasional mod A 4/5x    Status  Partially Met      SLP SHORT TERM GOAL #5   Title  Spouse will utilize gestures and written word cues to communicate with pt in simple conversation of 3-4 turns with occasional min A.    Status  Not Met       SLP Long Term Goals - 01/30/18 1746      SLP LONG TERM GOAL #1   Title  Pt will ID object to spoken phrase/description f:3 8/10x    Time  4    Period  Weeks    Status  Achieved and ongoing      SLP LONG TERM GOAL #2   Title  Pt will ID object to written phrase f:4 8/10x and occasional min A    Time  4  Period  Weeks    Status  Achieved and ongoing      SLP LONG TERM GOAL #3   Title  Pt will name 5 items in a simple category with occasional min A.    Time  4    Period  Weeks    Status  On-going and ongoing      SLP LONG TERM GOAL #4   Title  Pt will write 1-2 words in sentence completion task 8/10x with occasional min A    Time  4    Period  Weeks    Status  On-going and ongoing      SLP LONG TERM GOAL #5   Title  Pt/spouse will utilize multimodal communication for simple conversation of 4-5 turns with occasional min A over 2 sessions.     Time  6    Period  Weeks    Status  Achieved and ongoing      SLP LONG TERM GOAL #6   Title  Pt will verbalize subject, verb object sentence in structured task with occasional min A 7/10x    Time  4    Period  Weeks    Status  On-going       Plan - 01/30/18 1747    Duration  -- 2x a week for 20 weels or total of 37 visits       Patient will benefit from skilled therapeutic intervention in order to improve the following deficits and impairments:    Aphasia  Apraxia    Problem List There are no active problems to display for this patient.   Lovvorn, Annye Rusk MS, CCC-SLP 01/30/2018, 5:49 PM  [facility] [address] [place], [place], [address] Phone: [phone]   Fax:  [phone]   Name: [name] MRN: [id number] Date of Birth: 1951/11/19

## 2018-02-04 ENCOUNTER — Encounter: Payer: Self-pay | Admitting: Speech Pathology

## 2018-02-04 ENCOUNTER — Ambulatory Visit: Payer: Medicare Other | Admitting: Speech Pathology

## 2018-02-04 DIAGNOSIS — R4701 Aphasia: Secondary | ICD-10-CM | POA: Diagnosis not present

## 2018-02-04 DIAGNOSIS — R482 Apraxia: Secondary | ICD-10-CM

## 2018-02-04 NOTE — Therapy (Signed)
[facility] [address] [place], [place], [address] Phone: [phone]   Fax:  [phone]  Speech Language Pathology Treatment  Patient Details  Name: [name] MRN: [id number] Date of Birth: Jan 07, 1951 Referring Provider: Dr. Launa Grill   Encounter Date: 02/04/2018  End of Session - 02/04/18 1423    SLP Start Time  1231    SLP Stop Time   1315    SLP Time Calculation (min)  44 min    Activity Tolerance  Patient tolerated treatment well       History reviewed. No pertinent past medical history.  History reviewed. No pertinent surgical history.  There were no vitals filed for this visit.  Subjective Assessment - 02/04/18 1242    Subjective  "We didn't work as much as I thought"    Patient is accompained by:  Family member            ADULT SLP TREATMENT - 02/04/18 1242      General Information   Behavior/Cognition  Alert;Cooperative;Requires cueing      Treatment Provided   Treatment provided  Cognitive-Linquistic      Pain Assessment   Pain Assessment  No/denies pain      Cognitive-Linquistic Treatment   Treatment focused on  Aphasia;Apraxia    Skilled Treatment  Word finding and verbal expression facilitated using occupation cards, naming occupations and generating 2-3 word descriptions about the occupation. Pt named occupations 70% of trials, increased to 90% with occasional semantic and written cues. Pt generated sentece re: occuaption with 60% accuracy, increased to 70% accuracy with cloze or repetition cues. Written expression of simple foods at word level with 70% accuracy and occasional mod letter or copying cues.       Assessment / Recommendations / Plan   Plan  Continue with current plan of care      Progression Toward Goals   Progression toward goals  Progressing toward goals         SLP Short Term Goals - 02/04/18 1422      SLP SHORT TERM GOAL #1   Title  Pt will ID object  to spoken word f:3 8/10x with occasional min gestural cues.    Status  Achieved      SLP SHORT TERM GOAL #2   Title  Pt will ID object to written word f:4 with occasional min A 8/10x    Status  Achieved      SLP SHORT TERM GOAL #3   Title  Pt will name basic objects with sentence completion cues 8/10x    Status  Achieved      SLP SHORT TERM GOAL #4   Title  Pt will write biographical information/family names with occasional mod A 4/5x    Status  Partially Met      SLP SHORT TERM GOAL #5   Title  Spouse will utilize gestures and written word cues to communicate with pt in simple conversation of 3-4 turns with occasional min A.    Status  Not Met       SLP Long Term Goals - 02/04/18 1422      SLP LONG TERM GOAL #1   Title  Pt will ID object to spoken phrase/description f:3 8/10x    Time  4    Period  Weeks    Status  Achieved and ongoing      SLP LONG TERM GOAL #2   Title  Pt will ID object to written phrase  f:4 8/10x and occasional min A    Time  4    Period  Weeks    Status  Achieved and ongoing      SLP LONG TERM GOAL #3   Title  Pt will name 5 items in a simple category with occasional min A.    Time  3    Period  Weeks    Status  On-going and ongoing      SLP LONG TERM GOAL #4   Title  Pt will write 1-2 words in sentence completion task 8/10x with occasional min A    Time  3    Period  Weeks    Status  On-going and ongoing      SLP LONG TERM GOAL #5   Title  Pt/spouse will utilize multimodal communication for simple conversation of 4-5 turns with occasional min A over 2 sessions.     Time  3    Period  Weeks    Status  Achieved and ongoing      SLP LONG TERM GOAL #6   Title  Pt will verbalize subject, verb object sentence in structured task with occasional min A 7/10x    Time  3    Period  Weeks    Status  On-going       Plan - 02/04/18 1422    Family Member Consulted  spouse       Patient will benefit from skilled therapeutic intervention in order  to improve the following deficits and impairments:   Aphasia  Apraxia    Problem List There are no active problems to display for this patient.   Elery Cadenhead, Annye Rusk MS, CCC-SLP 02/04/2018, 2:24 PM  [facility] [address] [place], [place], [address] Phone: [phone]   Fax:  [phone]   Name: Machai Desmith MRN: [id number] Date of Birth: 1951/05/09

## 2018-02-06 ENCOUNTER — Encounter: Payer: Self-pay | Admitting: Speech Pathology

## 2018-02-06 ENCOUNTER — Ambulatory Visit: Payer: Medicare Other | Admitting: Speech Pathology

## 2018-02-06 DIAGNOSIS — R482 Apraxia: Secondary | ICD-10-CM

## 2018-02-06 DIAGNOSIS — R4701 Aphasia: Secondary | ICD-10-CM

## 2018-02-08 NOTE — Therapy (Addendum)
[name] [address] [place], [place], [address] Phone: [phone]   Fax:  [phone]  Speech Language Pathology Treatment  Patient Details  Name: [name] MRN: [id number] Date of Birth: [birth year] Referring Provider: Dr. Launa Grill   Encounter Date: 02/06/2018  End of Session - 02/08/18 1805    SLP Start Time  3825    SLP Stop Time   1401    SLP Time Calculation (min)  44 min    Activity Tolerance  Patient tolerated treatment well       History reviewed. No pertinent past medical history.  History reviewed. No pertinent surgical history.  There were no vitals filed for this visit.         ADULT SLP TREATMENT - 02/08/18 0001      General Information   Behavior/Cognition  Alert;Cooperative;Requires cueing      Treatment Provided   Treatment provided  Cognitive-Linquistic      Pain Assessment   Pain Assessment  No/denies pain      Cognitive-Linquistic Treatment   Treatment focused on  Aphasia;Apraxia    Skilled Treatment  Facilitated divegent naming utilizing photo cards, naming simple categories (pizza topping, fast toods) Pt named 3-4 items with min A, 5-7 items with frequent mod A - Written expression writing items in categories with frequent mod A - fill in blank, give the next letter or cue to spell word aloud. Pt would often spell word aloud correctly, however with aphasic errors in writing. Pt aware of his errors but required mod to max A to correct.       Assessment / Recommendations / Plan   Plan  Continue with current plan of care      Progression Toward Goals   Progression toward goals  Progressing toward goals         SLP Short Term Goals - 02/08/18 1754      SLP SHORT TERM GOAL #1   Title  Pt will ID object to spoken word f:3 8/10x with occasional min gestural cues.    Status  Achieved      SLP SHORT TERM GOAL #2   Title  Pt will ID object to written word f:4 with  occasional min A 8/10x    Status  Achieved      SLP SHORT TERM GOAL #3   Title  Pt will name basic objects with sentence completion cues 8/10x    Status  Achieved      SLP SHORT TERM GOAL #4   Title  Pt will write biographical information/family names with occasional mod A 4/5x    Status  Partially Met      SLP SHORT TERM GOAL #5   Title  Spouse will utilize gestures and written word cues to communicate with pt in simple conversation of 3-4 turns with occasional min A.    Status  Not Met       SLP Long Term Goals - 02/08/18 1754      SLP LONG TERM GOAL #1   Title  Pt will ID object to spoken phrase/description f:3 8/10x    Time  4    Period  Weeks    Status  Achieved and ongoing      SLP LONG TERM GOAL #2   Title  Pt will ID object to written phrase f:4 8/10x and occasional min A    Time  4    Period  Weeks    Status  Achieved  and ongoing      SLP LONG TERM GOAL #3   Title  Pt will name 5 items in a simple category with occasional min A.    Time  3    Period  Weeks    Status  On-going and ongoing      SLP LONG TERM GOAL #4   Title  Pt will write 1-2 words in sentence completion task 8/10x with occasional min A    Time  3    Period  Weeks    Status  On-going and ongoing      SLP LONG TERM GOAL #5   Title  Pt/spouse will utilize multimodal communication for simple conversation of 4-5 turns with occasional min A over 2 sessions.     Time  3    Period  Weeks    Status  Achieved and ongoing      SLP LONG TERM GOAL #6   Title  Pt will verbalize subject, verb object sentence in structured task with occasional min A 7/10x    Time  3    Period  Weeks    Status  On-going       Plan - 02/08/18 1753    Clinical Impression Statement  Slow progress continues with word finding, fluency and perseveration. Simple sentence generation using photo cue card with mod A - Written expression of simple favorite foods with mod A. Pt continues to demonstrate significant difficulty at  sentence and conversation level verbal expression. Continue skilled ST to maximize communicatin for independence, QOL and to reduce caregiver burden    Speech Therapy Frequency  2x / week    Treatment/Interventions  Internal/external aids;Compensatory strategies;Environmental controls;Compensatory techniques;Language facilitation;Cueing hierarchy;Multimodal communcation approach;Functional tasks;Patient/family education;SLP instruction and feedback;Cognitive reorganization    Potential to Achieve Goals  Fair    Potential Considerations  Severity of impairments;Cooperation/participation level    Consulted and Agree with Plan of Care  Patient    Family Member Consulted  spouse       Patient will benefit from skilled therapeutic intervention in order to improve the following deficits and impairments:   Aphasia  Apraxia    Problem List There are no active problems to display for this patient.   Wai Litt, Annye Rusk MS, CCC-SLP 02/08/2018, 6:06 PM  [facility] [address] [place], [place], [address] Phone: [phone]   Fax:  [phone]   Name: [name] MRN: [id number] Date of Birth: 02/03/1951

## 2018-02-11 ENCOUNTER — Encounter: Payer: Self-pay | Admitting: Speech Pathology

## 2018-02-11 ENCOUNTER — Ambulatory Visit: Payer: Medicare Other | Admitting: Speech Pathology

## 2018-02-11 DIAGNOSIS — R482 Apraxia: Secondary | ICD-10-CM

## 2018-02-11 DIAGNOSIS — R4701 Aphasia: Secondary | ICD-10-CM | POA: Diagnosis not present

## 2018-02-11 NOTE — Therapy (Signed)
[facility] [address] [place], [place], [address] Phone: [phone]   Fax:  [phone]  Speech Language Pathology Treatment  Patient Details  Name: [name] MRN: [id number] Date of Birth: 1951/09/11 Referring Provider: Dr. Launa Grill   Encounter Date: 02/11/2018  End of Session - 02/11/18 1203    Visit Number  29    Number of Visits  36    Date for SLP Re-Evaluation  02/21/18    SLP Start Time  1103    SLP Stop Time   1147    SLP Time Calculation (min)  44 min    Activity Tolerance  Patient tolerated treatment well       History reviewed. No pertinent past medical history.  History reviewed. No pertinent surgical history.  There were no vitals filed for this visit.  Subjective Assessment - 02/11/18 1115    Subjective  "I was so thrilled he did his homework - then I found out he did it with his friend"    Patient is accompained by:  Family member            ADULT SLP TREATMENT - 02/11/18 1153      General Information   Behavior/Cognition  Alert;Cooperative;Requires cueing      Treatment Provided   Treatment provided  Cognitive-Linquistic      Cognitive-Linquistic Treatment   Treatment focused on  Aphasia;Apraxia    Skilled Treatment  Pt named photos of high frequeuncy words/objects 15/20 independently, mid to low frequency words/photos with 70% accuracy and occasional min semantic, phonemic or written cues. Pt anwered simple questions (re: weekend, meals) with 2-3 word answers.      Assessment / Recommendations / Plan   Plan  Continue with current plan of care      Progression Toward Goals   Progression toward goals  Progressing toward goals       SLP Education - 02/11/18 1158    Education provided  Yes    Education Details  types of cues friend and spouse can provide for his naming homework    Person(s) Educated  Patient;Spouse    Methods  Explanation    Comprehension   Verbalized understanding       SLP Short Term Goals - 02/11/18 1202      SLP SHORT TERM GOAL #1   Title  Pt will ID object to spoken word f:3 8/10x with occasional min gestural cues.    Status  Achieved      SLP SHORT TERM GOAL #2   Title  Pt will ID object to written word f:4 with occasional min A 8/10x    Status  Achieved      SLP SHORT TERM GOAL #3   Title  Pt will name basic objects with sentence completion cues 8/10x    Status  Achieved      SLP SHORT TERM GOAL #4   Title  Pt will write biographical information/family names with occasional mod A 4/5x    Status  Partially Met      SLP SHORT TERM GOAL #5   Title  Spouse will utilize gestures and written word cues to communicate with pt in simple conversation of 3-4 turns with occasional min A.    Status  Not Met       SLP Long Term Goals - 02/11/18 1202      SLP LONG TERM GOAL #1   Title  Pt will ID object to spoken phrase/description f:3 8/10x  Time  4    Period  Weeks    Status  Achieved and ongoing      SLP LONG TERM GOAL #2   Title  Pt will ID object to written phrase f:4 8/10x and occasional min A    Time  4    Period  Weeks    Status  Achieved and ongoing      SLP LONG TERM GOAL #3   Title  Pt will name 5 items in a simple category with occasional min A.    Time  2    Period  Weeks    Status  On-going and ongoing      SLP LONG TERM GOAL #4   Title  Pt will write 1-2 words in sentence completion task 8/10x with occasional min A    Time  3    Period  Weeks    Status  On-going and ongoing      SLP LONG TERM GOAL #5   Title  Pt/spouse will utilize multimodal communication for simple conversation of 4-5 turns with occasional min A over 2 sessions.     Time  2    Period  Weeks    Status  Achieved and ongoing      SLP LONG TERM GOAL #6   Title  Pt will verbalize subject, verb object sentence in structured task with occasional min A 7/10x    Time  2    Period  Weeks    Status  On-going       Plan  - 02/11/18 1159    Clinical Impression Statement  Pt with improvement in speed, accuracy and reduced perseverations in simple confrontation naming tasks. Written expression continues to require min to mod A at word level. Simple conversation with increased MLU to 4-5 words spontaneously. Continue skilled ST to maximize communication for independnece and to reduce caregiver and pt frustration.     Speech Therapy Frequency  2x / week    Treatment/Interventions  Internal/external aids;Compensatory strategies;Environmental controls;Compensatory techniques;Language facilitation;Cueing hierarchy;Multimodal communcation approach;Functional tasks;Patient/family education;SLP instruction and feedback;Cognitive reorganization    Potential to Achieve Goals  Fair    Potential Considerations  Severity of impairments;Cooperation/participation level    Consulted and Agree with Plan of Care  Patient    Family Member Consulted  spouse       Patient will benefit from skilled therapeutic intervention in order to improve the following deficits and impairments:   Aphasia  Apraxia    Problem List There are no active problems to display for this patient.   Doniel Maiello, Annye Rusk MS, CCC-SLP 02/11/2018, 12:03 PM  [facility] [address] [place], [place], [address] Phone: [phone]   Fax:  [phone]   Name: [name] MRN: [id number] Date of Birth: November 01, 1951

## 2018-02-13 ENCOUNTER — Ambulatory Visit: Payer: Medicare Other | Admitting: Speech Pathology

## 2018-02-13 ENCOUNTER — Encounter: Payer: Self-pay | Admitting: Speech Pathology

## 2018-02-13 DIAGNOSIS — R482 Apraxia: Secondary | ICD-10-CM

## 2018-02-13 DIAGNOSIS — R4701 Aphasia: Secondary | ICD-10-CM | POA: Diagnosis not present

## 2018-02-13 NOTE — Patient Instructions (Signed)
  Provide a word bank and number bank in shop  Provide a bank of common phrases, companies, customer names - maybe in a binder with plastic sleeves  Label tools - white label, black print - all caps  Maybe keep a time log where he writes down start time and finish time - have a digital clock in the shop  Practice naming colors, numbers, shapes, clock reading

## 2018-02-13 NOTE — Therapy (Signed)
[facility] [address] [place], [place], [address] Phone: [phone]   Fax:  [phone]  Speech Language Pathology Treatment  Patient Details  Name: [name] MRN: [id number] Date of Birth: 1951/09/10 Referring Provider: Dr. Launa Grill   Encounter Date: 02/13/2018  End of Session - 02/13/18 1215    Visit Number  30    Number of Visits  36    Date for SLP Re-Evaluation  02/21/18       History reviewed. No pertinent past medical history.  History reviewed. No pertinent surgical history.  There were no vitals filed for this visit.  Subjective Assessment - 02/13/18 1152    Subjective  "Alright - I'm trying"    Patient is accompained by:  Family member    Currently in Pain?  No/denies            ADULT SLP TREATMENT - 02/13/18 1153      General Information   Behavior/Cognition  Alert;Cooperative;Requires cueing      Treatment Provided   Treatment provided  Cognitive-Linquistic      Cognitive-Linquistic Treatment   Treatment focused on  Aphasia;Apraxia;Patient/family/caregiver education    Skilled Treatment  Auditory comprehension and convergent naming facilitated by ST describing common items with 3-4 descriptions and pt naming the word describied. Pt frequently required repeption, reduced rate and gestures for auditory comprehension. Naming 75% accurate with 1st letter or rare phonemic cues. I did not give phrase completion cues today, due to the automatic nature of them. Pt consistently require exteneded time for processing verbal descriptions. 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 Pt named colors 70% accuracy - self corrected x2.       Assessment / Recommendations / Plan   Plan  Continue with current plan of care      Progression Toward Goals   Progression toward goals  Progressing toward goals       SLP Education - 02/13/18 1203    Education provided  Yes    Education Details  using gestures to augment his auditory comprehension, preorder meal with spouse so she can correct if it is said wrong to the waiter.    Person(s) Educated  Patient;Spouse    Methods  Explanation    Comprehension  Verbal cues required;Need further instruction       SLP Short Term Goals - 02/13/18 1215      SLP SHORT TERM GOAL #1   Title  Pt will ID object to spoken word f:3 8/10x with occasional min gestural cues.    Status  Achieved      SLP SHORT TERM GOAL #2   Title  Pt will ID object to written word f:4 with occasional min A 8/10x    Status  Achieved      SLP SHORT TERM GOAL #3   Title  Pt will name basic objects with sentence completion cues 8/10x    Status  Achieved      SLP SHORT TERM GOAL #4   Title  Pt will write biographical information/family names with occasional mod A 4/5x    Status  Partially Met      SLP SHORT TERM GOAL #5   Title  Spouse will utilize gestures and written word cues to communicate with pt in simple conversation of 3-4 turns with occasional min A.    Status  Not Met       SLP Long Term Goals - 02/13/18 1215  SLP LONG TERM GOAL #1   Title  Pt will ID object to spoken phrase/description f:3 8/10x    Time  4    Period  Weeks    Status  Achieved and ongoing      SLP LONG TERM GOAL #2   Title  Pt will ID object to written phrase f:4 8/10x and occasional min A    Time  4    Period  Weeks    Status  Achieved and ongoing      SLP LONG TERM GOAL #3   Title  Pt will name 5 items in a simple category with occasional min A.    Time  2    Period  Weeks    Status  On-going and ongoing      SLP LONG TERM GOAL #4   Title  Pt will write 1-2 words in sentence completion task 8/10x with occasional min A    Time  3    Period  Weeks    Status  On-going and ongoing      SLP LONG TERM GOAL #5   Title  Pt/spouse will utilize multimodal communication for simple conversation of 4-5 turns with occasional  min A over 2 sessions.     Time  2    Period  Weeks    Status  Achieved and ongoing      SLP LONG TERM GOAL #6   Title  Pt will verbalize subject, verb object sentence in structured task with occasional min A 7/10x    Time  2    Period  Weeks    Status  On-going       Plan - 02/13/18 1213    Clinical Impression Statement  Auditory comprehension of simple objects with no visual cues or pictures required frequent mod A and gestures. Convergent naming with no pictures or visual cues with frequent mod A. 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 Continue skilled ST to maximize communication for independence.     Speech Therapy Frequency  2x / week    Treatment/Interventions  Internal/external aids;Compensatory strategies;Environmental controls;Compensatory techniques;Language facilitation;Cueing hierarchy;Multimodal communcation approach;Functional tasks;Patient/family education;SLP instruction and feedback;Cognitive reorganization    Potential to Achieve Goals  Fair    Potential Considerations  Severity of impairments;Cooperation/participation level    Consulted and Agree with Plan of Care  Patient    Family Member Consulted  spouse       Patient will benefit from skilled therapeutic intervention in order to improve the following deficits and impairments:   Aphasia  Apraxia    Problem List There are no active problems to display for this patient.   Syrianna Schillaci, Annye Rusk MS, [place] 02/13/2018, 12:20 PM  [facility] [address] [place], [place], [address] Phone: [phone]   Fax:  [phone]   Name: [name] MRN: [id number] Date of Birth: 05/09/1951

## 2018-02-18 ENCOUNTER — Ambulatory Visit: Payer: Medicare Other | Admitting: Speech Pathology

## 2018-02-18 DIAGNOSIS — R4701 Aphasia: Secondary | ICD-10-CM | POA: Diagnosis not present

## 2018-02-18 DIAGNOSIS — R482 Apraxia: Secondary | ICD-10-CM

## 2018-02-18 NOTE — Therapy (Signed)
[facility] [address] [place], [place], [address] Phone: [phone]   Fax:  [phone]  Speech Language Pathology Treatment  Patient Details  Name: [name] MRN: [id number] Date of Birth: [birth year] Referring Provider: Dr. Launa Grill   Encounter Date: 02/18/2018  End of Session - 02/18/18 1743    Visit Number  31    Number of Visits  36    Date for SLP Re-Evaluation  02/21/18    SLP Start Time  0355    SLP Stop Time   1445    SLP Time Calculation (min)  47 min    Activity Tolerance  Patient tolerated treatment well       No past medical history on file.  No past surgical history on file.  There were no vitals filed for this visit.  Subjective Assessment - 02/18/18 1400    Subjective  "I got all the papers together    Patient is accompained by:  Family member            ADULT SLP TREATMENT - 02/18/18 1400      General Information   Behavior/Cognition  Alert;Cooperative;Requires cueing      Treatment Provided   Treatment provided  Cognitive-Linquistic      Pain Assessment   Pain Assessment  No/denies pain      Cognitive-Linquistic Treatment   Treatment focused on  Aphasia;Apraxia;Patient/family/caregiver education    Skilled Treatment  Facilitated written expression of simple functional math with extended time and occasional min A. Pt sucessfully filled out date and copied information to fill out a check with min A. Pt named colors with 70% accuracy and word bank of colors as cue. Pt generated 3-4 word sentence with photo of basic object. Spontaneous conversation with extended time and min cues, pt took 4 turns with MLU of 6 explaining what he works on in his yard.       Assessment / Recommendations / Plan   Plan  Continue with current plan of care      Progression Toward Goals   Progression toward goals  Progressing toward goals         SLP Short Term Goals - 02/18/18 1742      SLP SHORT TERM GOAL #1   Title  Pt will ID object to spoken word f:3 8/10x with occasional min gestural cues.    Status  Achieved      SLP SHORT TERM GOAL #2   Title  Pt will ID object to written word f:4 with occasional min A 8/10x    Status  Achieved      SLP SHORT TERM GOAL #3   Title  Pt will name basic objects with sentence completion cues 8/10x    Status  Achieved      SLP SHORT TERM GOAL #4   Title  Pt will write biographical information/family names with occasional mod A 4/5x    Status  Partially Met      SLP SHORT TERM GOAL #5   Title  Spouse will utilize gestures and written word cues to communicate with pt in simple conversation of 3-4 turns with occasional min A.    Status  Not Met       SLP Long Term Goals - 02/18/18 1742      SLP LONG TERM GOAL #1   Title  Pt will ID object to spoken phrase/description f:3 8/10x    Time  4    Period  Weeks  Status  Achieved and ongoing      SLP LONG TERM GOAL #2   Title  Pt will ID object to written phrase f:4 8/10x and occasional min A    Time  4    Period  Weeks    Status  Achieved and ongoing      SLP LONG TERM GOAL #3   Title  Pt will name 5 items in a simple category with occasional min A.    Time  2    Period  Weeks    Status  On-going and ongoing      SLP LONG TERM GOAL #4   Title  Pt will write 1-2 words in sentence completion task 8/10x with occasional min A    Time  2    Period  Weeks    Status  On-going and ongoing      SLP LONG TERM GOAL #5   Title  Pt/spouse will utilize multimodal communication for simple conversation of 4-5 turns with occasional min A over 2 sessions.     Time  1    Period  Weeks    Status  Achieved and ongoing      SLP LONG TERM GOAL #6   Title  Pt will verbalize subject, verb object sentence in structured task with occasional min A 7/10x    Time  2    Period  Weeks    Status  On-going       Plan - 02/18/18 1740    Clinical Impression Statement  Written expression of  numbers, simple functional math with extended time. Conversation was improved today with occasional min requests for clarification due to aphasic errors. 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 Recommend pt continue skillled ST to maximize langauge    Speech Therapy Frequency  2x / week    Treatment/Interventions  Internal/external aids;Compensatory strategies;Environmental controls;Compensatory techniques;Language facilitation;Cueing hierarchy;Multimodal communcation approach;Functional tasks;Patient/family education;SLP instruction and feedback;Cognitive reorganization    Potential to Achieve Goals  Fair    Potential Considerations  Severity of impairments;Cooperation/participation level    Consulted and Agree with Plan of Care  Patient       Patient will benefit from skilled therapeutic intervention in order to improve the following deficits and impairments:   Aphasia  Apraxia    Problem List There are no active problems to display for this patient.   Lovvorn, Laura Ann MS, CCC-SLP 02/18/2018, 5:43 PM  [facility] [address] [place], [place], [address] Phone: [phone]   Fax:  [phone]   Name: [name] MRN: [id number] Date of Birth: 01/15/1951 

## 2018-02-20 ENCOUNTER — Encounter: Payer: PRIVATE HEALTH INSURANCE | Admitting: Speech Pathology

## 2018-02-21 ENCOUNTER — Ambulatory Visit: Payer: Medicare Other | Attending: Physical Medicine and Rehabilitation | Admitting: Speech Pathology

## 2018-02-21 ENCOUNTER — Encounter: Payer: Self-pay | Admitting: Speech Pathology

## 2018-02-21 DIAGNOSIS — R482 Apraxia: Secondary | ICD-10-CM | POA: Insufficient documentation

## 2018-02-21 DIAGNOSIS — R4701 Aphasia: Secondary | ICD-10-CM | POA: Insufficient documentation

## 2018-02-21 NOTE — Patient Instructions (Signed)
  Use small notebook - write down names, order, topics you want to talk about  Look up menu before you leave the house - write down order in your notebook  Get preview of Bible study - read it with someone and write down some key points - keep it in a short 2-3 word phrases of the Bible topic  If you are going to lunch with a buddies, you can use the notebook to ask them to write a word or 2 of what they are talking about  Fatigue will absolutely affect your language and comprehension   [employer] [phone]  When you have a night time/evening event, you need to rest up during the day a little to make sure you language is the best it can be

## 2018-02-21 NOTE — Therapy (Signed)
[facility] [address] [place], [place], [address] Phone: [phone]   Fax:  [phone]  Speech Language Pathology Treatment  Patient Details  Name: [name] MRN: [id number] Date of Birth: 1951-04-29 Referring Provider: Dr. Launa Grill   Encounter Date: 02/21/2018  End of Session - 02/21/18 1233    Visit Number  32    Number of Visits  40    Date for SLP Re-Evaluation  04/04/18    SLP Start Time  1105    SLP Stop Time   1150    SLP Time Calculation (min)  45 min    Activity Tolerance  Patient tolerated treatment well       History reviewed. No pertinent past medical history.  History reviewed. No pertinent surgical history.  There were no vitals filed for this visit.  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  No/denies            ADULT SLP TREATMENT - 02/21/18 1213      General Information   Behavior/Cognition  Alert;Cooperative;Requires cueing      Treatment Provided   Treatment provided  Cognitive-Linquistic      Cognitive-Linquistic Treatment   Treatment focused on  Aphasia;Apraxia;Patient/family/caregiver education    Skilled Treatment  [place] expression reading and filling in blank in simple sentences with usual mod A to spell word correctly - pt verbally completed word with min A. Pt generated 5 items for simple categorie with occasional min A. Conversation with MLU 5-7 today re; sister coming to visit and church last night. Pt verbalizing that he goes to Wednesday night Bibile study, but does not understand what they are saying.       Assessment / Recommendations / Plan   Plan  Goals updated      Progression Toward Goals   Progression toward goals  Progressing toward goals       SLP Education - 02/21/18  1217    Education provided  Yes    Education Details  Looking up menu and deciding what to order prior to going out with friends, write it in notebook; Use small notebook to write down names/topics prior to getting together with friends.    Person(s) Educated  Patient;Spouse    Methods  Explanation;Demonstration;Handout;Verbal cues    Comprehension  Verbalized understanding;Verbal cues required;Need further instruction       SLP Short Term Goals - 02/21/18 1225      SLP [place] #1   Title  Pt will ID object to spoken word f:3 8/10x with occasional min gestural cues.    Status  Achieved      SLP SHORT TERM GOAL #2   Title  Pt will ID object to written word f:4 with occasional min A 8/10x    Status  Achieved      SLP SHORT TERM GOAL #3   Title  Pt will name basic objects with sentence completion cues 8/10x    Status  Achieved      SLP SHORT TERM GOAL #4   Title  Pt will write biographical information/family names with occasional mod A 4/5x    Status  Partially Met      SLP SHORT TERM GOAL #5   Title  Spouse  will utilize gestures and written word cues to communicate with pt in simple conversation of 3-4 turns with occasional min A.    Status  Not Met       SLP Long Term Goals - 02/21/18 1225      SLP LONG TERM GOAL #1   Title  Pt will ID object to spoken phrase/description f:3 8/10x    Time  4    Period  Weeks    Status  Achieved and ongoing      SLP LONG TERM GOAL #2   Title  Pt will ID object to written phrase f:4 8/10x and occasional min A    Time  4    Period  Weeks    Status  Achieved and ongoing      SLP LONG TERM GOAL #3   Title  Pt will name 5 items in a simple category with occasional min A.    Time  2    Period  Weeks    Status  Achieved and ongoing      SLP LONG TERM GOAL #4   Title  Pt will write 1-2 words in sentence completion task 8/10x with occasional min A    Time  2    Period  Weeks    Status  Partially Met and ongoing      SLP LONG  TERM GOAL #5   Title  Pt/spouse will utilize multimodal communication for simple conversation of 4-5 turns with occasional min A over 2 sessions.     Time  1    Period  Weeks    Status  Achieved and ongoing      Additional Long Term Goals   Additional Long Term Goals  Yes      SLP LONG TERM GOAL #6   Title  Pt will verbalize subject, verb object sentence in structured task with occasional min A 7/10x    Time  2    Period  Weeks    Status  Partially Met      SLP LONG TERM GOAL #7   Title  Pt will utilize notebook to [employer], work, Investment banker, corporate, social events and write down order, topics, names etc to facilitate comprehension and verbal expression per pt report with min A over 3 sessions    Time  6    Period  Weeks    Status  New      SLP LONG TERM GOAL #8   Title  Pt will perform functional written tasks (checks, orders, bills) utilizing visual cues and word bank with occasional min A from spouse over 3 sessions.    Time  6    Period  Weeks    Status  New      SLP LONG TERM GOAL  #9   TITLE  Pt will 2-4 word description/gesture to compensation for word finding episodes 3/5 opportunities with usual questioning cues over 2 sessions    Time  6    Period  Weeks    Status  New       Plan - 02/21/18 1218    Clinical Impression Statement  Pt continues to progress with all areas of language - reading comprehension at simple phrase level, written expression at word level with mod A; auditory comprehension at sentence/simple conversation with slow rate and gestural cues; verbal expression at phrase level for spontaneous speech, , convergent and divergent naming with mod A; confrontation naming simple objects intact. Moderate to severe aphasia continues to limit  pt's independence, ability to return to work and ability to communicate accrately with friends and family, and particpate in church and community events. I have update LTG's and recommend pt continue skilled ST 2x a week for 6 weeks  to maximize language for improved QOL. Pt and spouse agree after this next course of ST to take a break and then consider [employer] in the fall.     Speech Therapy Frequency  2x / week    Duration  -- 6 more weeks, or total of 12 more visits    Treatment/Interventions  Internal/external aids;Compensatory strategies;Environmental controls;Compensatory techniques;Language facilitation;Cueing hierarchy;Multimodal communcation approach;Functional tasks;Patient/family education;SLP instruction and feedback;Cognitive reorganization    Potential to Achieve Goals  Good    Consulted and Agree with Plan of Care  Patient    Family Member Consulted  spouse, [name]       Patient will benefit from skilled therapeutic intervention in order to improve the following deficits and impairments:   Aphasia - Plan: SLP plan of care cert/re-cert  Apraxia - Plan: SLP plan of care cert/re-cert    Problem List There are no active problems to display for this patient.   Lovvorn, Annye Rusk MS, CCC-SLP 02/21/2018, 12:35 PM  [facility] [address] [place], [place], [address] Phone: [phone]   Fax:  [phone]   Name: [name] MRN: [id number] Date of Birth: 1951-05-06

## 2018-02-25 ENCOUNTER — Ambulatory Visit: Payer: Medicare Other | Admitting: Speech Pathology

## 2018-02-25 ENCOUNTER — Encounter: Payer: Self-pay | Admitting: Speech Pathology

## 2018-02-25 DIAGNOSIS — R4701 Aphasia: Secondary | ICD-10-CM | POA: Diagnosis not present

## 2018-02-25 DIAGNOSIS — R482 Apraxia: Secondary | ICD-10-CM

## 2018-02-25 NOTE — Patient Instructions (Signed)
  Take pictures of things in your shop or yard  Write down what it is  Bring this back to therapy  Use a notebook to write down meal choices  Continue to do ST homework daily 1-2x daily

## 2018-02-27 ENCOUNTER — Encounter: Payer: Self-pay | Admitting: Speech Pathology

## 2018-02-27 ENCOUNTER — Ambulatory Visit: Payer: Medicare Other | Admitting: Speech Pathology

## 2018-02-27 DIAGNOSIS — R482 Apraxia: Secondary | ICD-10-CM

## 2018-02-27 DIAGNOSIS — R4701 Aphasia: Secondary | ICD-10-CM

## 2018-02-27 NOTE — Therapy (Signed)
[facility] [address] [place], [place], [address] Phone: [phone]   Fax:  [phone]  Speech Language Pathology Treatment  Patient Details  Name: [name] MRN: [id number] Date of Birth: April 19, 1951 Referring Provider: Dr. Launa Grill   Encounter Date: 02/25/2018  End of Session - 02/27/18 1041    Visit Number  33    Number of Visits  40    Date for SLP Re-Evaluation  04/04/18    SLP Start Time  3007    SLP Stop Time   6226    SLP Time Calculation (min)  43 min    Activity Tolerance  Patient tolerated treatment well       History reviewed. No pertinent past medical history.  History reviewed. No pertinent surgical history.  There were no vitals filed for this visit.         ADULT SLP TREATMENT - 02/27/18 1033      General Information   Behavior/Cognition  Alert;Cooperative;Requires cueing      Treatment Provided   Treatment provided  Cognitive-Linquistic      Cognitive-Linquistic Treatment   Treatment focused on  Aphasia;Apraxia;Patient/family/caregiver education    Skilled Treatment  Auditory comprehension and convergent naming facilitated with ST describing basic ojbects for pt to name - 80% accuracy with gestural cues, slow rate and consistent repetition. Verbal expression facilitated having pt describe 1-2 features of a object/photo with max A, questioning cues due to aphasic errors (real word, unrelated paraphasias) Written expression of money amounts with extended time, 85% accuracy and occasional  min to mod A for simple amounts/coin counting. Continue to instruct pt and spouse on use of notebook to help pt communicate. Pt resistant to using conspicuous compensations "I just want to do it myself"      Assessment / Recommendations / [place] with current plan of care      Progression Toward Goals   Progression toward goals  Progressing toward goals       SLP Education -  02/27/18 1037    Education provided  Yes    Education Details  Use of external aids and environmental compensations for aphasia    Person(s) Educated  Patient;Spouse    Methods  Explanation;Demonstration;Handout;Verbal cues    Comprehension  Verbalized understanding;Verbal cues required;Need further instruction       SLP Short Term Goals - 02/27/18 1040      SLP SHORT TERM GOAL #1   Title  Pt will ID object to spoken word f:3 8/10x with occasional min gestural cues.    Status  Achieved      SLP SHORT TERM GOAL #2   Title  Pt will ID object to written word f:4 with occasional min A 8/10x    Status  Achieved      SLP SHORT TERM GOAL #3   Title  Pt will name basic objects with sentence completion cues 8/10x    Status  Achieved      SLP SHORT TERM GOAL #4   Title  Pt will write biographical information/family names with occasional mod A 4/5x    Status  Partially Met      SLP SHORT TERM GOAL #5   Title  Spouse will utilize gestures and written word cues to communicate with pt in simple conversation of 3-4 turns with occasional min A.    Status  Not Met       SLP Long Term Goals - 02/27/18 1040  SLP LONG TERM GOAL #1   Title  Pt will ID object to spoken phrase/description f:3 8/10x    Time  4    Period  Weeks    Status  Achieved and ongoing      SLP LONG TERM GOAL #2   Title  Pt will ID object to written phrase f:4 8/10x and occasional min A    Time  4    Period  Weeks    Status  Achieved and ongoing      SLP LONG TERM GOAL #3   Title  Pt will name 5 items in a simple category with occasional min A.    Time  2    Period  Weeks    Status  Achieved and ongoing      SLP LONG TERM GOAL #4   Title  Pt will write 1-2 words in sentence completion task 8/10x with occasional min A    Time  2    Period  Weeks    Status  Partially Met and ongoing      SLP LONG TERM GOAL #5   Title  Pt/spouse will utilize multimodal communication for simple conversation of 4-5 turns with  occasional min A over 2 sessions.     Time  1    Period  Weeks    Status  Achieved and ongoing      SLP LONG TERM GOAL #6   Title  Pt will verbalize subject, verb object sentence in structured task with occasional min A 7/10x    Time  2    Period  Weeks    Status  Partially Met      SLP LONG TERM GOAL #7   Title  Pt will utilize notebook to [employer], work, Investment banker, corporate, social events and write down order, topics, names etc to facilitate comprehension and verbal expression per pt report with min A over 3 sessions    Time  6    Period  Weeks    Status  On-going      SLP LONG TERM GOAL #8   Title  Pt will perform functional written tasks (checks, orders, bills) utilizing visual cues and word bank with occasional min A from spouse over 3 sessions.    Time  6    Period  Weeks    Status  On-going      SLP LONG TERM GOAL  #9   TITLE  Pt will 2-4 word description/gesture to compensation for word finding episodes 3/5 opportunities with usual questioning cues over 2 sessions    Time  6    Period  Weeks    Status  On-going       Plan - 02/27/18 1038    Clinical Impression Statement  Onoing training for external and environemental compensations for aphasia. Pt continues to demonstrate significant aphasia with non related real word paraphasias affecting his ability to participate in conversation. 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    Speech Therapy Frequency  2x / week    Treatment/Interventions  Internal/external aids;Compensatory strategies;Environmental controls;Compensatory techniques;Language facilitation;Cueing hierarchy;Multimodal communcation approach;Functional tasks;Patient/family education;SLP instruction and feedback;Cognitive reorganization    Potential to Achieve Goals  Good    Potential Considerations  Severity of impairments;Cooperation/participation level    Consulted and Agree with Plan of Care  Patient     Family Member Consulted  spouse, [name]       Patient will benefit from skilled therapeutic intervention  in order to improve the following deficits and impairments:   Aphasia  Apraxia    Problem List There are no active problems to display for this patient.   Deniss Wormley, Annye Rusk MS, CCC-SLP 02/27/2018, 10:42 AM  [facility] [address] [place], [place], [address] Phone: [phone]   Fax:  [phone]   Name: Brodric Schauer MRN: [id number] Date of Birth: 04-13-1951

## 2018-02-27 NOTE — Therapy (Signed)
[facility] [address] [place], [place], [address] Phone: [phone]   Fax:  [phone]  Speech Language Pathology Treatment  Patient Details  Name: [name] MRN: [id number] Date of Birth: Jun 08, 1951 Referring Provider: Dr. Launa Grill   Encounter Date: 02/27/2018  End of Session - 02/27/18 1041    Visit Number  33    Number of Visits  40    Date for SLP Re-Evaluation  04/04/18    SLP Start Time  4034    SLP Stop Time   7425    SLP Time Calculation (min)  43 min    Activity Tolerance  Patient tolerated treatment well       History reviewed. No pertinent past medical history.  History reviewed. No pertinent surgical history.  There were no vitals filed for this visit.  Subjective Assessment - 02/27/18 1253    Subjective  "He named everything in the shop"    Patient is accompained by:  Family member    Currently in Pain?  No/denies            ADULT SLP TREATMENT - 02/27/18 1254      General Information   Behavior/Cognition  Alert;Cooperative;Requires cueing      Treatment Provided   Treatment provided  Cognitive-Linquistic      Cognitive-Linquistic Treatment   Treatment focused on  Aphasia;Apraxia;Patient/family/caregiver education    Skilled Treatment  Pt took photos of tools in his shop on his phone - pt answered questions re: tools and how he uses them with occasional min to mod questioning for clarification by ST due to aphasia. utterance length 6-8 words on this familiar topic. Written expression at word level writing parts of a car.Pt wrote 4/6 words correctly, with mod A to correct error words.       Assessment / Recommendations / Plan   Plan  Continue with current plan of care      Progression Toward Goals   Progression toward goals  Progressing toward goals       SLP Education - 02/27/18 1037    Education provided  Yes    Education Details  Use of external aids and  environmental compensations for aphasia    Person(s) Educated  Patient;Spouse    Methods  Explanation;Demonstration;Handout;Verbal cues    Comprehension  Verbalized understanding;Verbal cues required;Need further instruction       SLP Short Term Goals - 02/27/18 1332      SLP SHORT TERM GOAL #1   Title  Pt will ID object to spoken word f:3 8/10x with occasional min gestural cues.    Status  Achieved      SLP SHORT TERM GOAL #2   Title  Pt will ID object to written word f:4 with occasional min A 8/10x    Status  Achieved      SLP SHORT TERM GOAL #3   Title  Pt will name basic objects with sentence completion cues 8/10x    Status  Achieved      SLP SHORT TERM GOAL #4   Title  Pt will write biographical information/family names with occasional mod A 4/5x    Status  Partially Met      SLP SHORT TERM GOAL #5   Title  Spouse will utilize gestures and written word cues to communicate with pt in simple conversation of 3-4 turns with occasional min A.    Status  Not Met       SLP Long Term  Goals - 02/27/18 1332      SLP LONG TERM GOAL #1   Title  Pt will ID object to spoken phrase/description f:3 8/10x    Time  4    Period  Weeks    Status  Achieved and ongoing      SLP LONG TERM GOAL #2   Title  Pt will ID object to written phrase f:4 8/10x and occasional min A    Time  4    Period  Weeks    Status  Achieved and ongoing      SLP LONG TERM GOAL #3   Title  Pt will name 5 items in a simple category with occasional min A.    Time  2    Period  Weeks    Status  Achieved and ongoing      SLP LONG TERM GOAL #4   Title  Pt will write 1-2 words in sentence completion task 8/10x with occasional min A    Time  2    Period  Weeks    Status  Partially Met and ongoing      SLP LONG TERM GOAL #5   Title  Pt/spouse will utilize multimodal communication for simple conversation of 4-5 turns with occasional min A over 2 sessions.     Time  1    Period  Weeks    Status  Achieved and  ongoing      SLP LONG TERM GOAL #6   Title  Pt will verbalize subject, verb object sentence in structured task with occasional min A 7/10x    Time  2    Period  Weeks    Status  Partially Met      SLP LONG TERM GOAL #7   Title  Pt will utilize notebook to [employer], work, Investment banker, corporate, social events and write down order, topics, names etc to facilitate comprehension and verbal expression per pt report with min A over 3 sessions    Time  6    Period  Weeks    Status  On-going      SLP LONG TERM GOAL #8   Title  Pt will perform functional written tasks (checks, orders, bills) utilizing visual cues and word bank with occasional min A from spouse over 3 sessions.    Time  6    Period  Weeks    Status  On-going      SLP LONG TERM GOAL  #9   TITLE  Pt will 2-4 word description/gesture to compensation for word finding episodes 3/5 opportunities with usual questioning cues over 2 sessions    Time  6    Period  Weeks    Status  On-going       Plan - 02/27/18 1332    Clinical Impression Statement  Onoing training for external and environemental compensations for aphasia. Pt continues to demonstrate significant aphasia with non related real word paraphasias affecting his ability to participate in conversation. 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   [name], Annye Rusk MS, CCC-SLP 02/27/2018, 1:32 PM  [facility] [address] [place], [place], [address] Phone: [phone]   Fax:  [phone]  Name: [name] MRN: [id number] Date of Birth: 1951/04/18

## 2018-03-04 ENCOUNTER — Ambulatory Visit: Payer: Medicare Other | Admitting: Speech Pathology

## 2018-03-04 ENCOUNTER — Encounter: Payer: Self-pay | Admitting: Speech Pathology

## 2018-03-04 DIAGNOSIS — R4701 Aphasia: Secondary | ICD-10-CM

## 2018-03-04 DIAGNOSIS — R482 Apraxia: Secondary | ICD-10-CM

## 2018-03-04 NOTE — Therapy (Signed)
[facility] [address] [place], [place], [address] Phone: [phone]   Fax:  [phone]  Speech Language Pathology Treatment  Patient Details  Name: [name] MRN: [id number] Date of Birth: Apr 17, 1951 Referring Provider: Dr. Launa Grill   Encounter Date: 03/04/2018  End of Session - 03/04/18 1202    Visit Number  34    Number of Visits  40    Date for SLP Re-Evaluation  04/04/18    SLP Start Time  1103    SLP Stop Time   1145    SLP Time Calculation (min)  42 min    Activity Tolerance  Patient tolerated treatment well       History reviewed. No pertinent past medical history.  History reviewed. No pertinent surgical history.  There were no vitals filed for this visit.  Subjective Assessment - 03/04/18 1106    Subjective  How was your uh how was your trip    Patient is accompained by:  Family member    Currently in Pain?  No/denies            ADULT SLP TREATMENT - 03/04/18 1108      General Information   Behavior/Cognition  Alert;Cooperative;Requires cueing      Treatment Provided   Treatment provided  Cognitive-Linquistic      Cognitive-Linquistic Treatment   Treatment focused on  Aphasia;Apraxia;Patient/family/caregiver education    Skilled Treatment  Verbal expression at conversation level facilitated asking pt details about his extended family with extended time and usual questioning for accuracy, compensations for word finding  and consistent extended time. Pt read and answered questions re: menus, weather reports, with 85% accuracy and occasional min visual cues. Pt read and answered questions re: house specs with usual mod visual cues. Pt with consistent aphasic errors reading numbers aloud, however he is aware of errors and with extended time he is able to self correct.       Assessment / Recommendations / Plan   Plan  Continue with current plan of care      Progression Toward Goals    Progression toward goals  Progressing toward goals       SLP Education - 03/04/18 1159    Education provided  Yes    Education Details  progress in conversation; practice check writing at home - blank checks provided    Person(s) Educated  Patient;Spouse    Methods  Explanation    Comprehension  Verbalized understanding       SLP Short Term Goals - 03/04/18 1201      SLP SHORT TERM GOAL #1   Title  Pt will ID object to spoken word f:3 8/10x with occasional min gestural cues.    Status  Achieved      SLP SHORT TERM GOAL #2   Title  Pt will ID object to written word f:4 with occasional min A 8/10x    Status  Achieved      SLP SHORT TERM GOAL #3   Title  Pt will name basic objects with sentence completion cues 8/10x    Status  Achieved      SLP SHORT TERM GOAL #4   Title  Pt will write biographical information/family names with occasional mod A 4/5x    Status  Partially Met      SLP SHORT TERM GOAL #5   Title  Spouse will utilize gestures and written word cues to communicate with pt in simple conversation of 3-4 turns  with occasional min A.    Status  Not Met       SLP Long Term Goals - 03/04/18 1201      SLP LONG TERM GOAL #1   Title  Pt will ID object to spoken phrase/description f:3 8/10x    Time  4    Period  Weeks    Status  Achieved and ongoing      SLP LONG TERM GOAL #2   Title  Pt will ID object to written phrase f:4 8/10x and occasional min A    Time  4    Period  Weeks    Status  Achieved and ongoing      SLP LONG TERM GOAL #3   Title  Pt will name 5 items in a simple category with occasional min A.    Time  2    Period  Weeks    Status  Achieved and ongoing      SLP LONG TERM GOAL #4   Title  Pt will write 1-2 words in sentence completion task 8/10x with occasional min A    Time  2    Period  Weeks    Status  Partially Met and ongoing      SLP LONG TERM GOAL #5   Title  Pt/spouse will utilize multimodal communication for simple conversation  of 4-5 turns with occasional min A over 2 sessions.     Time  1    Period  Weeks    Status  Achieved and ongoing      SLP LONG TERM GOAL #6   Title  Pt will verbalize subject, verb object sentence in structured task with occasional min A 7/10x    Time  2    Period  Weeks    Status  Partially Met      SLP LONG TERM GOAL #7   Title  Pt will utilize notebook to [employer], work, Investment banker, corporate, social events and write down order, topics, names etc to facilitate comprehension and verbal expression per pt report with min A over 3 sessions    Time  5    Period  Weeks    Status  On-going      SLP LONG TERM GOAL #8   Title  Pt will perform functional written tasks (checks, orders, bills) utilizing visual cues and word bank with occasional min A from spouse over 3 sessions.    Time  5    Period  Weeks    Status  On-going      SLP LONG TERM GOAL  #9   TITLE  Pt will 2-4 word description/gesture to compensation for word finding episodes 3/5 opportunities with usual questioning cues over 2 sessions    Time  5    Period  Weeks    Status  On-going       Plan - 03/04/18 1159    Clinical Impression Statement  Simple conversation re: his family, menus travel plans with improved MLU and accuracy today. He continues to make aphasic errors, however he recognizes and lets listener know he has made an error usually. Pt with success reading simple functional information. Continue skilled ST to maximize independence and QOL.    Speech Therapy Frequency  2x / week    Treatment/Interventions  Internal/external aids;Compensatory strategies;Environmental controls;Compensatory techniques;Language facilitation;Cueing hierarchy;Multimodal communcation approach;Functional tasks;Patient/family education;SLP instruction and feedback;Cognitive reorganization    Potential to Achieve Goals  Good    Potential Considerations  Severity of impairments;Cooperation/participation level  Consulted and Agree with Plan of Care   Patient    Family Member Consulted  spouse, [name]       Patient will benefit from skilled therapeutic intervention in order to improve the following deficits and impairments:   Aphasia  Apraxia    Problem List There are no active problems to display for this patient.   [place], Annye Rusk MS, CCC-SLP 03/04/2018, 12:03 PM  [facility] [address] [place], [place], [address] Phone: [phone]   Fax:  [phone]   Name: Brando Taves MRN: [id number] Date of Birth: 07/04/1951

## 2018-03-06 ENCOUNTER — Ambulatory Visit: Payer: Medicare Other | Admitting: Speech Pathology

## 2018-03-06 DIAGNOSIS — R4701 Aphasia: Secondary | ICD-10-CM

## 2018-03-06 DIAGNOSIS — R482 Apraxia: Secondary | ICD-10-CM

## 2018-03-06 NOTE — Patient Instructions (Addendum)
    Pattricia BossAnnie Penn - Havery Morosabney Porter SLP  [employer] in the fall - if you are interested please call as soon as you can to get a spot  You can get some work books from [employer] - look up Aphasia workbooks on [employer]  Continue to practice naming things and naming items in categories   Name things in your shop and house

## 2018-03-06 NOTE — Therapy (Signed)
[facility] [address] [place], [place], [address] Phone: [phone]   Fax:  [phone]  Speech Language Pathology Treatment  Patient Details  Name: [name] MRN: [id number] Date of Birth: 03-24-1951 Referring Provider: Dr. Launa Grill   Encounter Date: 03/06/2018  End of Session - 03/06/18 1437    Visit Number  35    Number of Visits  40    Date for SLP Re-Evaluation  04/04/18    SLP Start Time  1320    SLP Stop Time   1400    SLP Time Calculation (min)  40 min    Activity Tolerance  Patient tolerated treatment well       No past medical history on file.  No past surgical history on file.  There were no vitals filed for this visit.  Subjective Assessment - 03/06/18 1432    Subjective  "Today is my last time" - pt electing to be d/c'd from [place]    Patient is accompained by:  Family member    Currently in Pain?  No/denies            ADULT SLP TREATMENT - 03/06/18 1429      General Information   Behavior/Cognition  Alert;Cooperative      Treatment Provided   Treatment provided  Cognitive-Linquistic      Cognitive-Linquistic Treatment   Treatment focused on  Aphasia;Apraxia;Patient/family/caregiver education    Skilled Chemical engineer with questioning cues to clarify with pt and spouse. Pt continues to increase MLU, however dysnomia persists. Spouse reports success with pt conversing with friends and family. Pt named 5 items for simple categories with occasional min A.       Assessment / Recommendations / Plan   Plan  Discharge SLP treatment due to (comment) pt request      Progression Toward Goals   Progression toward goals  Not progressing toward goals (comment) pt requesting d/c       SLP Education - 03/06/18 1433    Education provided  Yes    Education Details  [facility] speech clinic, continue working on speech and language at home;  Aphasia work books on [employer])  Educated  Patient;Spouse    Methods  Explanation;Handout    Comprehension  Verbalized understanding      SPEECH THERAPY DISCHARGE SUMMARY  Visits from [facility]: 35  Current functional level related to goals / functional outcomes:  See goals below  Remaining deficits: 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  Patient goals were partially met. Patient is being discharged due to the patient's request.  ?????         SLP Short Term Goals - 03/06/18 1436      SLP SHORT TERM GOAL #1   Title  Pt will ID object to spoken word f:3 8/10x with occasional min gestural cues.    Status  Achieved      SLP SHORT TERM GOAL #2   Title  Pt will ID object to written word f:4 with occasional min A 8/10x    Status  Achieved      SLP SHORT TERM GOAL #3   Title  Pt will name basic objects with sentence completion cues 8/10x    Status  Achieved  SLP SHORT TERM GOAL #4   Title  Pt will write biographical information/family names with occasional mod A 4/5x    Status  Partially Met      SLP SHORT TERM GOAL #5   Title  Spouse will utilize gestures and written word cues to communicate with pt in simple conversation of 3-4 turns with occasional min A.    Status  Not Met       SLP Long Term Goals - 03/06/18 1436      SLP LONG TERM GOAL #1   Title  Pt will ID object to spoken phrase/description f:3 8/10x    Time  4    Period  Weeks    Status  Achieved and ongoing      SLP LONG TERM GOAL #2   Title  Pt will ID object to written phrase f:4 8/10x and occasional min A    Time  4    Period  Weeks    Status  Achieved and ongoing      SLP LONG TERM GOAL #3   Title  Pt will name 5 items in a simple category with occasional min A.    Time  2    Period  Weeks    Status  Achieved and  ongoing      SLP LONG TERM GOAL #4   Title  Pt will write 1-2 words in sentence completion task 8/10x with occasional min A    Time  2    Period  Weeks    Status  Partially Met and ongoing      SLP LONG TERM GOAL #5   Title  Pt/spouse will utilize multimodal communication for simple conversation of 4-5 turns with occasional min A over 2 sessions.     Time  1    Period  Weeks    Status  Achieved and ongoing      SLP LONG TERM GOAL #6   Title  Pt will verbalize subject, verb object sentence in structured task with occasional min A 7/10x    Time  2    Period  Weeks    Status  Partially Met      SLP LONG TERM GOAL #7   Title  Pt will utilize notebook to [employer], work, Investment banker, corporate, social events and write down order, topics, names etc to facilitate comprehension and verbal expression per pt report with min A over 3 sessions    Baseline  pt declined use of notebook    Time  5    Period  Weeks    Status  Deferred      SLP LONG TERM GOAL #8   Title  Pt will perform functional written tasks (checks, orders, bills) utilizing visual cues and word bank with occasional min A from spouse over 3 sessions.    Time  5    Period  Weeks    Status  Achieved      SLP LONG TERM GOAL  #9   TITLE  Pt will 2-4 word description/gesture to compensation for word finding episodes 3/5 opportunities with usual questioning cues over 2 sessions    Time  5    Period  Weeks    Status  Achieved       Plan - 03/06/18 1434    Clinical Impression Statement  Spouse has reported relative success when pt converses with friends and family.  He is writing his own checks at home. Moderate aphasia persists, however pt electing to  stop ST at this time. I am in agreement with this. Pt aware that he can resume ST at [facility] in the fall. D/C ST at this time.     Speech Therapy Frequency  2x / week    Treatment/Interventions  Internal/external aids;Compensatory strategies;Environmental controls;Compensatory  techniques;Language facilitation;Cueing hierarchy;Multimodal communcation approach;Functional tasks;Patient/family education;SLP instruction and feedback;Cognitive reorganization    Potential to Achieve Goals  Good    Potential Considerations  Severity of impairments;Cooperation/participation level    Consulted and Agree with Plan of Care  Patient    Family Member Consulted  spouse, [name]       Patient will benefit from skilled therapeutic intervention in order to improve the following deficits and impairments:   Aphasia  Apraxia    Problem List There are no active problems to display for this patient.   Leanora Murin, Annye Rusk MS, CCC-SLP 03/06/2018, 2:38 PM  [facility] [address] [place], [place], [address] Phone: [phone]   Fax:  [phone]   Name: [name] MRN: [id number] Date of Birth: 1951/09/06

## 2018-03-13 ENCOUNTER — Encounter: Payer: PRIVATE HEALTH INSURANCE | Admitting: Speech Pathology

## 2018-03-18 ENCOUNTER — Encounter: Payer: PRIVATE HEALTH INSURANCE | Admitting: Speech Pathology

## 2018-03-20 ENCOUNTER — Encounter: Payer: PRIVATE HEALTH INSURANCE | Admitting: Speech Pathology

## 2018-04-01 ENCOUNTER — Ambulatory Visit: Payer: Medicare Other | Admitting: Speech Pathology

## 2018-04-03 ENCOUNTER — Encounter: Payer: PRIVATE HEALTH INSURANCE | Admitting: Speech Pathology

## 2018-08-18 ENCOUNTER — Ambulatory Visit (HOSPITAL_COMMUNITY): Payer: Medicare Other | Attending: Neurology | Admitting: Speech Pathology

## 2018-08-18 DIAGNOSIS — R4701 Aphasia: Secondary | ICD-10-CM | POA: Diagnosis not present

## 2018-08-18 DIAGNOSIS — R482 Apraxia: Secondary | ICD-10-CM | POA: Diagnosis present

## 2018-08-20 ENCOUNTER — Encounter (HOSPITAL_COMMUNITY): Payer: Self-pay | Admitting: Speech Pathology

## 2018-08-20 ENCOUNTER — Ambulatory Visit (HOSPITAL_COMMUNITY): Payer: Medicare Other | Admitting: Speech Pathology

## 2018-08-20 ENCOUNTER — Other Ambulatory Visit: Payer: Self-pay

## 2018-08-20 DIAGNOSIS — R4701 Aphasia: Secondary | ICD-10-CM

## 2018-08-20 DIAGNOSIS — R482 Apraxia: Secondary | ICD-10-CM

## 2018-08-21 ENCOUNTER — Encounter (HOSPITAL_COMMUNITY): Payer: Self-pay | Admitting: Speech Pathology

## 2018-08-21 NOTE — Therapy (Signed)
[facility] [address] [place], [place], [address] Phone: [phone]   Fax:  [phone]  Speech Language Pathology Treatment  Patient Details  Name: [name] MRN: [id number] Date of Birth: January 27, 1951 Referring Provider: Jay Schlichter, MD   Encounter Date: 08/20/2018  End of Session - 08/21/18 2020    Visit Number  2    Number of Visits  9    Date for SLP Re-Evaluation  09/26/18    Authorization Type  Medicare     SLP Start Time  1030    SLP Stop Time   1115    SLP Time Calculation (min)  45 min    Activity Tolerance  Patient tolerated treatment well       History reviewed. No pertinent past medical history.  History reviewed. No pertinent surgical history.  There were no vitals filed for this visit.  Subjective Assessment - 08/21/18 2018    Subjective  "I uh, want to get better."    Patient is accompained by:  Family member    Currently in Pain?  No/denies        SLP Evaluation [facility] - 08/21/18 2019      General Information   HPI  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 He received outpatient SLP therapy in [place] from 09/2017 to 02/2018 when he was discharged due to Pt wanting to take a break from therapy (long drive for family). He had made excellent gains during treatment. He elected to resume SLP therapy closer to his home in [place] over the summer, but felt like it was not a good fit for his current needs. He is here today accompanied by his wife, [name].         ADULT SLP TREATMENT - 08/21/18 0001      General Information   Behavior/Cognition  Alert;Cooperative;Pleasant mood    Patient Positioning  Upright in chair    Oral care provided  N/A    HPI  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 He received outpatient SLP therapy in  [place] from 09/2017 to 02/2018 when he was discharged due to Pt wanting to take a break from therapy (long drive for family). He had made excellent gains during treatment. He elected to resume SLP therapy closer to his home in [place] over the summer, but felt like it was not a good fit for his current needs. He is here today accompanied by his wife, [name].      Treatment Provided   Treatment provided  Cognitive-Linquistic      Pain Assessment   Pain Assessment  No/denies pain      Cognitive-Linquistic Treatment   Treatment focused on  Aphasia;Apraxia;Patient/family/caregiver education    Skilled Treatment  word finding strategies and implementation in structured and functional tasks      Assessment / Recommendations / Plan   Plan  Continue with current plan of care       SLP Education - 08/21/18 2020    Education Details  HEP    Person(s) Educated  Patient;Spouse    Methods  Explanation;Handout    Comprehension  Verbalized understanding       SLP Short Term Goals - 08/21/18 2021      SLP SHORT TERM GOAL #1   Title  Pt will increase naming  of low frequency objects/pictures to 80% acc when provided with mod multimodality cues.    Baseline  70%    Time  4    Period  Weeks    Status  On-going      SLP SHORT TERM GOAL #2   Title  Pt will implement word-finding strategies with 90% accuracy when unable to verbalize desired word in conversation/functional tasks with mi/mod assist.    Baseline  Few attempts made to use strategies when blocked    Time  4    Period  Weeks    Status  On-going      SLP SHORT TERM GOAL #3   Title  Pt will describe objects and pictures by providing at least three salient features as judged by clinician with 90% acc when provided mi/mod cues.    Baseline  mod/max cues    Time  4    Period  Weeks    Status  On-going      SLP SHORT TERM GOAL #4   Title  Pt will increase MLU to 7+ words when describing pictures with use of scripted cues and carrier  phrases as needed on 8/10 trials.    Baseline  4+    Time  4    Period  Weeks    Status  On-going      SLP SHORT TERM GOAL #5   Title  Pt will increase reading comprehension of short sentences with single word sentence completion and word bank provided to 80% acc with mi/mod assist.    Baseline  50%    Time  4    Period  Weeks    Status  On-going      SLP SHORT TERM GOAL #6   Title  Pt will write single words with 75% acc when looking at pictures, discussing family or high interest topics with mod assist from SLP    Baseline  40-60%    Time  4    Period  Weeks    Status  On-going       SLP Long Term Goals - 08/21/18 2014      SLP LONG TERM GOAL #1   Title  Same as short       Plan - 08/21/18 2021    Clinical Impression Statement Goals were reviewed and discussed with Pt and spouse and ongoing assessment of needs completed. Pt presented with pictures of common objects and asked to label them when provided with letter space cues and 50% of letters provided. Pt able to point to letters on a letter board with 100% acc when verbalized by SLP, however he had more difficulty labeling letters orally. SLP demonstrated incorporation of verbal mediation strategies and finger tracing letters for improved accuracy. 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 SLP provided written scaffolding cues to help support verbal communication. Next session, plan to discuss places he will go and people [place] will see [place]. Notebook provided.    Speech Therapy Frequency  2x / week    Duration  4 weeks    Treatment/Interventions  Compensatory strategies;Cueing hierarchy;Functional tasks;Patient/family education;Multimodal communcation approach;Compensatory techniques;Internal/external aids;SLP instruction and feedback;Language facilitation    Potential to Achieve Goals  Good    Potential Considerations  --   time post  stroke   SLP Home Exercise Plan  Pt will complete HEP as assigned to facilitate carryover of treatment strategies and  techniques in home environment with written cues provided.    Consulted and Agree with Plan of Care  Patient       Patient will benefit from skilled therapeutic intervention in order to improve the following deficits and impairments:   Aphasia  Apraxia    Problem List There are no active problems to display for this patient.  Thank you,  Havery Moros, CCC-SLP [phone]  [facility] 08/21/2018, 8:22 PM  [facility] [address] [place], [place], [address] Phone: [phone]   Fax:  [phone]   Name: [name] MRN: [id number] Date of Birth: [birth year]

## 2018-08-21 NOTE — Therapy (Signed)
[facility] [address] [place], [place], [address] Phone: [phone]   Fax:  [phone]  Speech Language Pathology Evaluation  Patient Details  Name: [name] MRN: [id number] Date of Birth: 15-Oct-1951 Referring Provider: Jay Schlichter, MD   Encounter Date: 08/18/2018  End of Session - 08/18/18 1958    Visit Number  1    Number of Visits  8    Date for SLP Re-Evaluation  09/26/18    Authorization Type  Medicare     SLP Start Time  1430    SLP Stop Time   1515    SLP Time Calculation (min)  45 min    Activity Tolerance  Patient tolerated treatment well       History reviewed. No pertinent past medical history.  History reviewed. No pertinent surgical history.  There were no vitals filed for this visit.  Subjective Assessment - 08/18/18 2321    Subjective  "I uh, want to get better."    Patient is accompained by:  Family member    Special Tests  Portions of BNT    Currently in Pain?  No/denies         SLP Evaluation [facility] - 08/18/18 0001      SLP Visit Information   SLP Received On  08/18/18    Referring Provider  Jay Schlichter, MD    Onset Date  09/11/2018    Medical Diagnosis  left CVA      Subjective   Subjective  "I wish I could get back exactly like I was."    Patient/Family Stated Goal  Improve speech      Pain Assessment   Currently in Pain?  No/denies      General Information   HPI  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 He received outpatient SLP therapy in [place] from 09/2017 to 02/2018 when he was discharged due to Pt wanting to take a break from therapy (long drive for family). He had made excellent gains during treatment. He elected to resume SLP therapy closer to his home in [place] over the summer, but felt like it was not a good fit for his current needs. He is here today accompanied by his wife, [name].    Behavioral/Cognition  alert and cooperative    Mobility Status  ambulatory      Balance Screen   Has the patient fallen in the past 6 months  No    Has the patient had a decrease in activity level because of a fear of falling?   No    Is the patient reluctant to leave their home because of a fear of falling?   No      Prior Functional Status   Cognitive/Linguistic Baseline  Baseline deficits    Baseline deficit details  aphasia from stroke 08/2018    Type of Home  House     Lives With  Spouse    Available Support  Family    Vocation  Retired      IT consultant   Overall Cognitive Status  Impaired/Different from baseline    Area of Impairment  Following commands    Following Commands  Follows multi-step commands with increased time    Memory  Appears intact    Awareness  Appears intact    Problem Solving  Impaired    Problem Solving Impairment  Verbal complex  Executive Function  --   negatively impacted by aphasia   Behaviors  Perseveration      Auditory Comprehension   Overall Auditory Comprehension  Impaired    Yes/No Questions  Impaired    Complex Questions  50-74% accurate    Commands  Impaired    Two Step Basic Commands  75-100% accurate    Multistep Basic Commands  50-74% accurate    Conversation  Moderately complex    EffectiveTechniques  Pausing;Visual/Gestural cues;Stressing words   written cues     Visual Recognition/Discrimination   Discrimination  Within Function Limits      Reading Comprehension   Reading Status  Impaired    Word level  26-50% accurate    Sentence Level  26-50% accurate    Paragraph Level  Not tested    Functional Environmental (signs, name badge)  Within functional limits      Expression   Primary Mode of Expression  Verbal      Verbal Expression   Overall Verbal Expression  Impaired    Initiation  No impairment    Automatic Speech  Name;Social Response    Level of Generative/Spontaneous Verbalization  Sentence    Repetition   Impaired    Level of Impairment  Sentence level    Naming  Impairment    Responsive  51-75% accurate    Confrontation  50-74% accurate    Convergent  50-74% accurate    Divergent  25-49% accurate    Verbal Errors  Semantic paraphasias;Perseveration;Aware of errors;Not aware of errors;Phonemic paraphasias;Neologisms;Inconsistent    Pragmatics  No impairment    Effective Techniques  Phonemic cues;Written cues;Sentence completion;Semantic cues    Non-Verbal Means of Communication  Not applicable      Written Expression   Dominant Hand  Right    Written Expression  Exceptions to [facility]    Self Formulation Ability  Letter;Word      Oral Motor/Sensory Function   Overall Oral Motor/Sensory Function  Appears within functional limits for tasks assessed      Motor Speech   Overall Motor Speech  Impaired    Respiration  Within functional limits    Phonation  Normal    Resonance  Within functional limits    Articulation  Within functional limitis    Intelligibility  Intelligibility reduced    Word  75-100% accurate    Motor Planning  Impaired    Level of Impairment  Word    Motor Speech Errors  Aware;Groping for words;Inconsistent    Phonation  [facility]      Standardized Assessments   Standardized Assessments   [place] Naming Test-2nd edition        SLP Short Term Goals - 08/18/18 2000      SLP SHORT TERM GOAL #1   Title  Pt will increase naming of low frequency objects/pictures to 80% acc when provided with mod multimodality cues.    Baseline  70%    Time  4    Period  Weeks    Status  New    Target Date  09/25/18      SLP SHORT TERM GOAL #2   Title  Pt will implement word-finding strategies with 90% accuracy when unable to verbalize desired word in conversation/functional tasks with mi/mod assist.    Baseline  Few attempts made to use strategies when blocked    Time  4    Period  Weeks    Status  New    Target Date  09/25/18  SLP SHORT TERM GOAL #3   Title  Pt will describe  objects and pictures by providing at least three salient features as judged by clinician with 90% acc when provided mi/mod cues.    Baseline  mod/max cues    Time  4    Period  Weeks    Status  New    Target Date  09/25/18      SLP SHORT TERM GOAL #4   Title  Pt will increase MLU to 7+ words when describing pictures with use of scripted cues and carrier phrases as needed on 8/10 trials.    Baseline  4+    Time  4    Period  Weeks    Status  New    Target Date  09/25/18      SLP SHORT TERM GOAL #5   Title  Pt will increase reading comprehension of short sentences with single word sentence completion and word bank provided to 80% acc with mi/mod assist.    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 He was discharged from SLP services in March (out[place] neuro rehab in [place] over the summer, and is motivated to target remaining deficits at this time. He is motivated and has excellent family support. Pt would primarily like to address word finding deficits (non fluent aphasia), check writing abilities, reading comprehension, and written expression. Recommend a trial period of therapy (2x/week for 4 weeks with reassessment at that time) and if Pt makes improvements, will  request additional visits. Pt and spouse are in agreement with plan of care.    Speech Therapy Frequency  2x / week    Duration  4 weeks    Treatment/Interventions  Compensatory strategies;Cueing hierarchy;Functional tasks;Patient/family education;Multimodal communcation approach;Compensatory techniques;Internal/external aids;SLP instruction and feedback;Language facilitation    Potential to Achieve Goals  Good    Potential Considerations  --   time post stroke   SLP Home Exercise Plan  Pt will complete HEP as assigned to facilitate carryover of treatment strategies and techniques in home environment with written cues provided.    Consulted and Agree with Plan of Care  Patient       Patient will benefit from skilled therapeutic intervention in order to improve the following deficits and impairments:   Aphasia  Apraxia    Problem List There are no active problems to display for this patient.  Thank you,  Havery MorosDabney Theora Vankirk, CCC-SLP [phone]  [name] 08/18/2018, 8:15  PM  [facility] [address] [place], [place], [address] Phone: [phone]   Fax:  [phone]  Name: [name] MRN: [id number] Date of Birth: [birth year]

## 2018-08-26 ENCOUNTER — Ambulatory Visit (HOSPITAL_COMMUNITY): Payer: Medicare Other | Attending: Neurology | Admitting: Speech Pathology

## 2018-08-26 ENCOUNTER — Encounter (HOSPITAL_COMMUNITY): Payer: Self-pay | Admitting: Speech Pathology

## 2018-08-26 DIAGNOSIS — R4701 Aphasia: Secondary | ICD-10-CM | POA: Insufficient documentation

## 2018-08-26 DIAGNOSIS — R482 Apraxia: Secondary | ICD-10-CM | POA: Diagnosis present

## 2018-08-26 NOTE — Therapy (Signed)
[facility] [address] [place], [place], [address] Phone: [phone]   Fax:  [phone]  Speech Language Pathology Treatment  Patient Details  Name: [name] MRN: [id number] Date of Birth: 11-14-1951 Referring Provider: Jay Schlichter, MD   Encounter Date: 08/26/2018  End of Session - 08/26/18 1731    Visit Number  3    Number of Visits  9    Date for SLP Re-Evaluation  09/26/18    Authorization Type  Medicare     SLP Start Time  1515    SLP Stop Time   1600    SLP Time Calculation (min)  45 min    Activity Tolerance  Patient tolerated treatment well       History reviewed. No pertinent past medical history.  History reviewed. No pertinent surgical history.  There were no vitals filed for this visit.  Subjective Assessment - 08/26/18 1601    Subjective  "I want to be like I was."    Patient is accompained by:  Family member    Currently in Pain?  No/denies       ADULT SLP TREATMENT - 08/26/18 0001      General Information   Behavior/Cognition  Alert;Cooperative;Pleasant mood    Patient Positioning  Upright in chair    Oral care provided  N/A    HPI  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 He received outpatient SLP therapy in [place] from 09/2017 to 02/2018 when he was discharged due to Pt wanting to take a break from therapy (long drive for family). He had made excellent gains during treatment. He elected to resume SLP therapy closer to his home in [place] over the summer, but felt like it was not a good fit for his current needs. He is here today accompanied by his wife, [name].      Treatment Provided   Treatment provided  Cognitive-Linquistic      Pain Assessment   Pain Assessment  No/denies pain      Cognitive-Linquistic Treatment   Treatment focused on  Aphasia;Apraxia;Patient/family/caregiver education    Skilled  Treatment  word finding strategies and implementation in structured and functional tasks      Assessment / Recommendations / Plan   Plan  Continue with current plan of care       SLP Education - 08/26/18 1731    Education Details  HEP to copy single words from picture cards    Person(s) Educated  Patient;Spouse    Methods  Explanation;Handout    Comprehension  Verbalized understanding       SLP Short Term Goals - 08/26/18 1734      SLP SHORT TERM GOAL #1   Title  Pt will increase naming of low frequency objects/pictures to 80% acc when provided with mod multimodality cues.    Baseline  70%    Time  4    Period  Weeks    Status  On-going      SLP SHORT TERM GOAL #2   Title  Pt will implement word-finding strategies with 90% accuracy when unable to verbalize desired word in conversation/functional tasks with mi/mod assist.    Baseline  Few attempts made to use strategies when blocked    Time  4    Period  Weeks    Status  On-going      SLP SHORT TERM GOAL #3  Title  Pt will describe objects and pictures by providing at least three salient features as judged by clinician with 90% acc when provided mi/mod cues.    Baseline  mod/max cues    Time  4    Period  Weeks    Status  On-going      SLP SHORT TERM GOAL #4   Title  Pt will increase MLU to 7+ words when describing pictures with use of scripted cues and carrier phrases as needed on 8/10 trials.    Baseline  4+    Time  4    Period  Weeks    Status  On-going      SLP SHORT TERM GOAL #5   Title  Pt will increase reading comprehension of short sentences with single word sentence completion and word bank provided to 80% acc with mi/mod assist.    Baseline  50%    Time  4    Period  Weeks    Status  On-going      SLP SHORT TERM GOAL #6   Title  Pt will write single words with 75% acc when looking at pictures, discussing family or high interest topics with mod assist from SLP    Baseline  40-60%    Time  4    Period   Weeks    Status  On-going       SLP Long Term Goals - 08/21/18 2014      SLP LONG TERM GOAL #1   Title  Same as short       Plan - 08/26/18 1733    Clinical Impression Statement Pt accompanied to therapy by his wife. His homework was completed with assistance from his spouse (labeling and spelling single words with letter space cues). Pt able to point to desired letter on letter board upon request, however has a difficult time naming letters he sees. Pt encouraged to practice copying single words from set list for homework after making a visual association. Increased non-fluency noted today during conversation, however improvement in MLU during structured tasks (create a sentence with a given word). Continue plan of care and asked Pt/spouse to start a list of places in the community they frequently go to.   Speech Therapy Frequency  2x / week    Duration  4 weeks    Treatment/Interventions  Compensatory strategies;Cueing hierarchy;Functional tasks;Patient/family education;Multimodal communcation approach;Compensatory techniques;Internal/external aids;SLP instruction and feedback;Language facilitation    Potential to Achieve Goals  Good    Potential Considerations  --   time post stroke   SLP Home Exercise Plan  Pt will complete HEP as assigned to facilitate carryover of treatment strategies and techniques in home environment with written cues provided.    Consulted and Agree with Plan of Care  Patient       Patient will benefit from skilled therapeutic intervention in order to improve the following deficits and impairments:   Aphasia  Apraxia    Problem List There are no active problems to display for this patient.  Thank you,  Havery Moros, CCC-SLP [phone]  [facility] 08/26/2018, 5:35 PM  [facility] [address] [place], [place], [address] Phone: [phone]   Fax:  [phone]   Name: [name] MRN:  [id number] Date of Birth: 1951/02/24

## 2018-08-28 ENCOUNTER — Encounter (HOSPITAL_COMMUNITY): Payer: PRIVATE HEALTH INSURANCE | Admitting: Speech Pathology

## 2018-09-01 ENCOUNTER — Encounter (HOSPITAL_COMMUNITY): Payer: PRIVATE HEALTH INSURANCE | Admitting: Speech Pathology

## 2018-09-04 ENCOUNTER — Encounter

## 2018-09-08 ENCOUNTER — Encounter (HOSPITAL_COMMUNITY): Payer: PRIVATE HEALTH INSURANCE | Admitting: Speech Pathology

## 2018-09-09 ENCOUNTER — Encounter (HOSPITAL_COMMUNITY): Payer: Self-pay | Admitting: Speech Pathology

## 2018-09-09 ENCOUNTER — Ambulatory Visit (HOSPITAL_COMMUNITY): Payer: Medicare Other | Admitting: Speech Pathology

## 2018-09-09 DIAGNOSIS — R4701 Aphasia: Secondary | ICD-10-CM | POA: Diagnosis not present

## 2018-09-09 NOTE — Therapy (Signed)
[facility] [address] [place], [place], [address] Phone: [phone]   Fax:  [phone]  Speech Language Pathology Treatment  Patient Details  Name: Matthew FerdinandDennis Rojas MRN: [id number] Date of Birth: 10/20/1951 Referring Provider: Jay SchlichterVani Chilukuri, MD   Encounter Date: 09/09/2018  End of Session - 09/09/18 1644    Visit Number  4    Number of Visits  9    Date for SLP Re-Evaluation  09/26/18    Authorization Type  Medicare     SLP Start Time  1517    SLP Stop Time   1620    SLP Time Calculation (min)  63 min    Activity Tolerance  Patient tolerated treatment well       History reviewed. No pertinent past medical history.  History reviewed. No pertinent surgical history.  There were no vitals filed for this visit.  Subjective Assessment - 09/09/18 1643    Subjective  "I wish I could just say it."    Patient is accompained by:  Family member    Currently in Pain?  No/denies         SLP Education - 09/09/18 1643    Education Details  Reading comprehension and single word sentence completion tasks    Person(s) Educated  Patient    Methods  Explanation;Handout    Comprehension  Verbalized understanding       SLP Short Term Goals - 09/09/18 1704      SLP SHORT TERM GOAL #1   Title  Pt will increase naming of low frequency objects/pictures to 80% acc when provided with mod multimodality cues.    Baseline  70%    Time  4    Period  Weeks    Status  On-going      SLP SHORT TERM GOAL #2   Title  Pt will implement word-finding strategies with 90% accuracy when unable to verbalize desired word in conversation/functional tasks with mi/mod assist.    Baseline  Few attempts made to use strategies when blocked    Time  4    Period  Weeks    Status  On-going      SLP SHORT TERM GOAL #3   Title  Pt will describe objects and pictures by providing at least three salient features as judged by clinician with 90% acc when provided  mi/mod cues.    Baseline  mod/max cues    Time  4    Period  Weeks    Status  On-going      SLP SHORT TERM GOAL #4   Title  Pt will increase MLU to 7+ words when describing pictures with use of scripted cues and carrier phrases as needed on 8/10 trials.    Baseline  4+    Time  4    Period  Weeks    Status  On-going      SLP SHORT TERM GOAL #5   Title  Pt will increase reading comprehension of short sentences with single word sentence completion and word bank provided to 80% acc with mi/mod assist.    Baseline  50%    Time  4    Period  Weeks    Status  On-going      SLP SHORT TERM GOAL #6   Title  Pt will write single words with 75% acc when looking at pictures, discussing family or high interest topics with mod assist from SLP    Baseline  40-60%  Time  4    Period  Weeks    Status  On-going       SLP Long Term Goals - 08/21/18 2014      SLP LONG TERM GOAL #1   Title  Same as short       Plan - 09/09/18 1647    Clinical Impression Statement  Pt accompanied to therapy by his wife. He had to reschedule some treatment sessions due to going out of town. SLP facilitated session by providing written prompts/outline during structured conversation regarding his trip to [facility] and [place]. He showed signs of frustration over not being able to fluently respond to questions, but used total communication strategies when cued by SLP. He was able to locate items on a menu brought in from a local restaurant with min cues and verbalize wants/needs with variable min to min/mod cues. SLP encouraged Pt to use automatic sequences to self cue when attempting to name item prices. He shared that he would like to be able to talk to people at car shows when they approach him with questions about his car. He will also be going to a 50th high school reunion in October. Will plan to practice set scripts for each next session. Reading comprehension homework was provided. Moderate expressive aphasia  persists.    Speech Therapy Frequency  2x / week    Duration  4 weeks    Treatment/Interventions  Compensatory strategies;Cueing hierarchy;Functional tasks;Patient/family education;Multimodal communcation approach;Compensatory techniques;Internal/external aids;SLP instruction and feedback;Language facilitation    Potential to Achieve Goals  Good    Potential Considerations  --   time post stroke   SLP Home Exercise Plan  Pt will complete HEP as assigned to facilitate carryover of treatment strategies and techniques in home environment with written cues provided.    Consulted and Agree with Plan of Care  Patient       Patient will benefit from skilled therapeutic intervention in order to improve the following deficits and impairments:   Aphasia    Problem List There are no active problems to display for this patient.  Thank you,  Havery Moros, CCC-SLP [phone]  [facility] 09/09/2018, 5:05 PM  [place] [facility] [address] [place], [place], [address] Phone: [phone]   Fax:  [phone]   Name: [name] MRN: [id number] Date of Birth: 1951/08/09

## 2018-09-15 ENCOUNTER — Ambulatory Visit (HOSPITAL_COMMUNITY): Payer: Medicare Other | Admitting: Speech Pathology

## 2018-09-15 DIAGNOSIS — R4701 Aphasia: Secondary | ICD-10-CM

## 2018-09-16 ENCOUNTER — Encounter (HOSPITAL_COMMUNITY): Payer: Self-pay | Admitting: Speech Pathology

## 2018-09-16 NOTE — Therapy (Signed)
[place] [facility] [address] , [place], [address] Phone: [phone]   Fax:  [phone]  Speech Language Pathology Treatment  Patient Details  Name: [name] MRN: [id number] Date of Birth: 10/14/1951 Referring Provider: Jay SchlichterVani Chilukuri, MD   Encounter Date: 09/15/2018  End of Session - 09/15/18 0025    Visit Number  5    Number of Visits  9    Date for SLP Re-Evaluation  09/26/18    Authorization Type  Medicare     SLP Start Time  1430    SLP Stop Time   1515    SLP Time Calculation (min)  45 min    Activity Tolerance  Patient tolerated treatment well       History reviewed. No pertinent past medical history.  History reviewed. No pertinent surgical history.  There were no vitals filed for this visit.   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 [place] 09/[place] to 02/2018 when he was discharged due to Pt wanting to take a break from therapy (long drive for family). He had made excellent gains during treatment. He elected to resume SLP therapy [place] in [place] the [place], but felt like it was not a good fit for his current needs. He is here today accompanied by his wife, [name].      Treatment Provided   Treatment provided  Cognitive-Linquistic      Pain Assessment   Pain Assessment  No/denies pain      Cognitive-Linquistic Treatment   Treatment focused on  Aphasia;Apraxia;Patient/family/caregiver education    Skilled Treatment  word finding strategies and implementation in structured and functional tasks, role playing scripts for car show      Assessment / Recommendations / Plan   Plan   Continue with current plan of care         SLP Short Term Goals - 09/15/18 0026      SLP SHORT TERM GOAL #1   Title  Pt will increase naming of low frequency objects/pictures to 80% acc when provided with mod multimodality cues.    Baseline  70%    Time  4    Period  Weeks    Status  On-going      SLP SHORT TERM GOAL #2   Title  Pt will implement word-finding strategies with 90% accuracy when unable to verbalize desired word in conversation/functional tasks with mi/mod assist.    Baseline  Few attempts made to use strategies when blocked    Time  4    Period  Weeks    Status  On-going      SLP SHORT TERM GOAL #3   Title  Pt will describe objects and pictures by providing at least three salient features as judged by clinician with 90% acc when provided mi/mod cues.    Baseline  mod/max cues    Time  4    Period  Weeks    Status  On-going      SLP SHORT TERM GOAL #4   Title  Pt will increase MLU to 7+ words when describing pictures with  use of scripted cues and carrier phrases as needed on 8/10 trials.    Baseline  4+    Time  4    Period  Weeks    Status  On-going      SLP SHORT TERM GOAL #5   Title  Pt will increase reading comprehension of short sentences with single word sentence completion and word bank provided to 80% acc with mi/mod assist.    Baseline  50%    Time  4    Period  Weeks    Status  On-going      SLP SHORT TERM GOAL #6   Title  Pt will write single words with 75% acc when looking at pictures, discussing family or high interest topics with mod assist from SLP    Baseline  40-60%    Time  4    Period  Weeks    Status  On-going       SLP Long Term Goals - 08/21/18 2014      SLP LONG TERM GOAL #1   Title  Same as short       Plan - 09/15/18 0026    Clinical Impression Statement  Pt accompanied to therapy by his wife. Verbal scripts were developed surrounding answering questions regarding car show conversations. [name] required min cues  for clarification with auditory comprehension questions and benefited from written supplementation. He was noted to use the written cues/outline throughout our conversation to help with word retrieval and topic maintenance. Subjectively, [name] seemed more talkative and engaged in conversation today. His wife reported that he told visitors at the car show that he had a stroke which impacted his speech instead of saying, "I can't talk". Pt agreed that this helped him relax some when talking with people. He completed reading comprehension homework and both he and his wife felt it was the appropriate level for him to continue with at home. Moderate expressive aphasia persists.    Speech Therapy Frequency  2x / week    Duration  4 weeks    Treatment/Interventions  Compensatory strategies;Cueing hierarchy;Functional tasks;Patient/family education;Multimodal communcation approach;Compensatory techniques;Internal/external aids;SLP instruction and feedback;Language facilitation    Potential to Achieve Goals  Good    Potential Considerations  --   time post stroke   SLP Home Exercise Plan  Pt will complete HEP as assigned to facilitate carryover of treatment strategies and techniques in home environment with written cues provided.    Consulted and Agree with Plan of Care  Patient       Patient will benefit from skilled therapeutic intervention in order to improve the following deficits and impairments:   Aphasia    Problem List There are no active problems to display for this patient.  Thank you,  Havery Moros, CCC-SLP [phone]  [facility] 09/15/2018, 15:35  [place] [address] [place], [place], [address] Phone: [phone]   Fax:  [phone]   Name: [name] MRN: [id number] Date of Birth: August 09, 1951

## 2018-09-18 ENCOUNTER — Encounter (HOSPITAL_COMMUNITY): Payer: Self-pay | Admitting: Speech Pathology

## 2018-09-18 ENCOUNTER — Ambulatory Visit (HOSPITAL_COMMUNITY): Payer: Medicare Other | Admitting: Speech Pathology

## 2018-09-18 DIAGNOSIS — R4701 Aphasia: Secondary | ICD-10-CM | POA: Diagnosis not present

## 2018-09-18 DIAGNOSIS — R482 Apraxia: Secondary | ICD-10-CM

## 2018-09-18 NOTE — Therapy (Signed)
[facility] [address] [place], [place], [address] Phone: [phone]   Fax:  [phone]  Speech Language Pathology Treatment  Patient Details  Name: [name] MRN: [id number] Date of Birth: 1951/02/06 Referring Provider (SLP): Jay Schlichter, MD   Encounter Date: 09/18/2018  End of Session - 09/18/18 1800    Visit Number  6    Number of Visits  9    Date for SLP Re-Evaluation  09/26/18    Authorization Type  Medicare     SLP Start Time  1430    SLP Stop Time   1520    SLP Time Calculation (min)  50 min    Activity Tolerance  Patient tolerated treatment well       History reviewed. No pertinent past medical history.  History reviewed. No pertinent surgical history.  There were no vitals filed for this visit.  Subjective Assessment - 09/18/18 1800    Subjective  "I got some stuff on my head off."    Patient is accompained by:  Family member    Currently in Pain?  No/denies        ADULT SLP TREATMENT - 09/18/18 0001      General Information   Behavior/Cognition  Alert;Cooperative;Pleasant mood    Patient Positioning  Upright in chair    Oral care provided  N/A    HPI  Mr. 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 He received outpatient SLP therapy in [place] from 09/2017 to 02/2018 when he was discharged due to Pt wanting to take a break from therapy (long drive for family). He had made excellent gains during treatment. He elected to resume SLP therapy closer to his home in [place] over the summer, but felt like it was not a good fit for his current needs. He is here today accompanied by his wife, [name].      Treatment Provided   Treatment provided  Cognitive-Linquistic      Pain Assessment   Pain Assessment  No/denies pain      Cognitive-Linquistic Treatment   Treatment focused on  Aphasia;Apraxia;Patient/family/caregiver education     Skilled Treatment  word finding strategies and implementation in structured and functional tasks, role playing scripts for car show      Assessment / Recommendations / Plan   Plan  Continue with current plan of care         SLP Short Term Goals - 09/18/18 1801      SLP SHORT TERM GOAL #1   Title  Pt will increase naming of low frequency objects/pictures to 80% acc when provided with mod multimodality cues.    Baseline  70%    Time  4    Period  Weeks    Status  On-going      SLP SHORT TERM GOAL #2   Title  Pt will implement word-finding strategies with 90% accuracy when unable to verbalize desired word in conversation/functional tasks with mi/mod assist.    Baseline  Few attempts made to use strategies when blocked    Time  4    Period  Weeks    Status  On-going      SLP SHORT TERM GOAL #3   Title  Pt will describe objects and pictures by providing at least three salient features as judged by clinician with 90% acc when provided mi/mod cues.    Baseline  mod/max cues  Time  4    Period  Weeks    Status  On-going      SLP SHORT TERM GOAL #4   Title  Pt will increase MLU to 7+ words when describing pictures with use of scripted cues and carrier phrases as needed on 8/10 trials.    Baseline  4+    Time  4    Period  Weeks    Status  On-going      SLP SHORT TERM GOAL #5   Title  Pt will increase reading comprehension of short sentences with single word sentence completion and word bank provided to 80% acc with mi/mod assist.    Baseline  50%    Time  4    Period  Weeks    Status  On-going      SLP SHORT TERM GOAL #6   Title  Pt will write single words with 75% acc when looking at pictures, discussing family or high interest topics with mod assist from SLP    Baseline  40-60%    Time  4    Period  Weeks    Status  On-going       SLP Long Term Goals - 09/18/18 1803      SLP LONG TERM GOAL #1   Title  Same as short       Plan - 09/18/18 1801    Clinical  Impression Statement Pt accompanied to therapy by his wife.  He brought in a [employer] which featured an article about the [facility] he restored. SLP provided visual cueing and min verbal cues for oral reading of several sentences of the article. He often omitted words or summarized when unable to fluently read. He completed single word sentence completion tasks with 100% acc and orally read items with 88% acc. SLP cued Pt to visualize the meaning of word and/or gesture the action when having difficulty verbalizing the word. SLP provided carrier phrase, "This is a picture of _____" when Pt describing pictures using scaffolding techniques. Moderate expressive aphasia persists.     Speech Therapy Frequency  2x / week    Duration  4 weeks    Treatment/Interventions  Compensatory strategies;Cueing hierarchy;Functional tasks;Patient/family education;Multimodal communcation approach;Compensatory techniques;Internal/external aids;SLP instruction and feedback;Language facilitation    Potential to Achieve Goals  Good    Potential Considerations  --   time post stroke   SLP Home Exercise Plan  Pt will complete HEP as assigned to facilitate carryover of treatment strategies and techniques in home environment with written cues provided.    Consulted and Agree with Plan of Care  Patient       Patient will benefit from skilled therapeutic intervention in order to improve the following deficits and impairments:   Aphasia  Apraxia    Problem List There are no active problems to display for this patient.  Thank you,  Havery Moros, CCC-SLP [phone]  [facility] 09/18/2018, 6:04 PM  [place] [facility] [address] [place], [place], [address] Phone: [phone]   Fax:  [phone]   Name: Carley Strickling MRN: [id number] Date of Birth: Jan 10, 1951

## 2018-09-22 ENCOUNTER — Ambulatory Visit (HOSPITAL_COMMUNITY): Payer: Medicare Other | Admitting: Speech Pathology

## 2018-09-22 ENCOUNTER — Telehealth (HOSPITAL_COMMUNITY): Payer: Self-pay | Admitting: Family Medicine

## 2018-09-22 NOTE — Telephone Encounter (Signed)
09/22/18  wife left a message that this weeks appts should have been cancelled

## 2018-09-25 ENCOUNTER — Encounter (HOSPITAL_COMMUNITY): Payer: PRIVATE HEALTH INSURANCE | Admitting: Speech Pathology

## 2018-09-29 ENCOUNTER — Ambulatory Visit (HOSPITAL_COMMUNITY): Payer: Medicare Other | Attending: Family Medicine | Admitting: Speech Pathology

## 2018-09-29 ENCOUNTER — Encounter (HOSPITAL_COMMUNITY): Payer: Self-pay | Admitting: Speech Pathology

## 2018-09-29 DIAGNOSIS — R4701 Aphasia: Secondary | ICD-10-CM | POA: Insufficient documentation

## 2018-09-29 DIAGNOSIS — R482 Apraxia: Secondary | ICD-10-CM | POA: Diagnosis present

## 2018-09-29 NOTE — Therapy (Signed)
[facility] [place], [place], [address] Phone: [phone]   Fax:  [phone]  Speech Language Pathology Treatment  Patient Details  Name: [name] MRN: [id number] Date of Birth: 1951-03-10 Referring Provider (SLP): Wynona Canes, MD   Encounter Date: 09/29/2018  End of Session - 09/29/18 1558    Visit Number  7    Number of Visits  25    Date for SLP Re-Evaluation  11/27/18    Authorization Type  Medicare     SLP Start Time  1435    SLP Stop Time   6962    SLP Time Calculation (min)  48 min    Activity Tolerance  Patient tolerated treatment well       History reviewed. No pertinent past medical history.  History reviewed. No pertinent surgical history.  There were no vitals filed for this visit.  Subjective Assessment - 09/29/18 1538    Subjective  "We went to the beach."    Patient is accompained by:  Family member    Currently in Pain?  No/denies       ADULT SLP TREATMENT - 09/29/18 0001      General Information   Behavior/Cognition  Alert;Cooperative;Pleasant mood    Patient Positioning  Upright in chair    Oral care provided  N/A    HPI  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 He received outpatient SLP therapy in [place] from 09/2017 to 02/2018 when he was discharged due to Pt wanting to take a break from therapy (long drive for family). He had made excellent gains during treatment. He elected to resume SLP therapy closer to his home in [place] over the summer, but felt like it was not a good fit for his current needs. He is here today accompanied by his wife, [name].      Treatment Provided   Treatment provided  Cognitive-Linquistic      Pain Assessment   Pain Assessment  No/denies pain      Cognitive-Linquistic Treatment   Treatment focused on  Aphasia;Apraxia;Patient/family/caregiver education    Skilled  Treatment  SLP provided skilled education regarding rationale for written cues to assist with auditory comprehension, topic maintenance, and word finding skills during client centered discussion of personal experiences. Pt participated in conversation regarding his recent trip to [facility] and responded to open ended questions with use of written cues for support. He was encouraged to utilize previously taught word finding strategies when unable to communicate his thoughts. SLP provided visual support via  basic drawings to facilitate comprehension and verbal responses from Pt. Pt participated in naming to description task with mi/moderate SLP support via functional descriptions and associations.      Assessment / Recommendations / Plan   Plan  Continue with current plan of care      Progression Toward Goals   Progression toward goals  Progressing toward goals       SLP Education - 09/29/18 1557    Education Details  Provided check writing template for numbers for homework and single word sentence completiong task with word bank    Person(s) Educated  Patient;Spouse    Methods  Explanation;Demonstration;Handout    Comprehension  Verbalized understanding       SLP Short Term Goals - 09/29/18 1607      SLP SHORT TERM GOAL #1   Title  Pt will increase  naming of low frequency objects/pictures to 80% acc when provided with mod multimodality cues.   10/7 change to mi/mod cues   Baseline  70%    Time  4    Period  Weeks    Status  Achieved      SLP SHORT TERM GOAL #2   Title  Pt will implement word-finding strategies with 90% accuracy when unable to verbalize desired word in conversation/functional tasks with mi/mod assist.    Baseline  Few attempts made to use strategies when blocked    Time  4    Period  Weeks    Status  Partially Met      SLP SHORT TERM GOAL #3   Title  Pt will describe objects and pictures by providing at least three salient features as judged by clinician with  90% acc when provided mi/mod cues.    Baseline  mod/max cues    Time  4    Period  Weeks    Status  Partially Met      SLP SHORT TERM GOAL #4   Title  Pt will increase MLU to 7+ words when describing pictures with use of scripted cues and carrier phrases as needed on 8/10 trials.   10/7 change to 8+   Baseline  4+    Time  4    Period  Weeks    Status  Achieved      SLP SHORT TERM GOAL #5   Title  Pt will increase reading comprehension of short sentences with single word sentence completion and word bank provided to 80% acc with mi/mod assist.   change to 90% min assist    Baseline  50%    Time  4    Period  Weeks    Status  Achieved      SLP SHORT TERM GOAL #6   Title  Pt will write single words with 75% acc when looking at pictures, discussing family or high interest topics with mod assist from SLP    Baseline  40-60%    Time  4    Period  Weeks    Status  Partially Met       SLP Long Term Goals - 09/18/18 [place] #1   Title  Same as short       Plan - 09/29/18 1602    Clinical Impression Statement  Pt accompanied to therapy by his wife. His missed therapy last week due to being out of town. He attended 6 treatment sessions since his initial evaluation and has made good progress in his expressive language goals. He and his wife report a subjective improvement in communication skills and appear excited about progress thus far. Recommend continued skilled SLP therapy 2x/week for an additional 8 weeks due to progress made.   Speech Therapy Frequency  2x / week    Duration  4 weeks   8 weeks   Treatment/Interventions  Compensatory strategies;Cueing hierarchy;Functional tasks;Patient/family education;Multimodal communcation approach;Compensatory techniques;Internal/external aids;SLP instruction and feedback;Language facilitation    Potential to Achieve Goals  Good    Potential Considerations  --   time post stroke   SLP Home Exercise Plan  Pt will  complete HEP as assigned to facilitate carryover of treatment strategies and techniques in home environment with written cues provided.    Consulted and Agree with Plan of Care  Patient       Patient will benefit from skilled therapeutic intervention  in order to improve the following deficits and impairments:   Aphasia  Apraxia    Problem List There are no active problems to display for this patient.  Thank you,  Genene Churn, [place]  [facility] 09/29/2018, 4:10 PM  [facility] [address] [place], [place], [address] Phone: [phone]   Fax:  [phone]   Name: [name] MRN: [id number] Date of Birth: [birth year]

## 2018-10-02 ENCOUNTER — Ambulatory Visit (HOSPITAL_COMMUNITY): Payer: Medicare Other | Admitting: Speech Pathology

## 2018-10-02 ENCOUNTER — Encounter (HOSPITAL_COMMUNITY): Payer: Self-pay | Admitting: Speech Pathology

## 2018-10-02 DIAGNOSIS — R4701 Aphasia: Secondary | ICD-10-CM

## 2018-10-02 DIAGNOSIS — R482 Apraxia: Secondary | ICD-10-CM

## 2018-10-02 NOTE — Therapy (Signed)
[name] [address] [place], [place], [address] Phone: [phone]   Fax:  [phone]  Speech Language Pathology Treatment  Patient Details  Name: [name] MRN: [id number] Date of Birth: 1951-03-18 Referring Provider (SLP): Jay Schlichter, MD   Encounter Date: 10/02/2018  End of Session - 10/02/18 1741    Visit Number  8    Number of Visits  25    Date for SLP Re-Evaluation  11/27/18    Authorization Type  Medicare     SLP Start Time  1430    SLP Stop Time   1515    SLP Time Calculation (min)  45 min    Activity Tolerance  Patient tolerated treatment well       History reviewed. No pertinent past medical history.  History reviewed. No pertinent surgical history.  There were no vitals filed for this visit.  Subjective Assessment - 10/02/18 1729    Subjective  "I want to be able to do it all."    Patient is accompained by:  Family member    Currently in Pain?  No/denies      ADULT SLP TREATMENT - 10/02/18 0001      General Information   Behavior/Cognition  Alert;Cooperative;Pleasant mood    Patient Positioning  Upright in chair    Oral care provided  N/A    HPI  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 He received outpatient SLP therapy in [place] from 09/2017 to 02/2018 when he was discharged due to Pt wanting to take a break from therapy (long drive for family). He had made excellent gains during treatment. He elected to resume SLP therapy closer to his home in [place] over the summer, but felt like it was not a good fit for his current needs. He is here today accompanied by his wife, [name].      Treatment Provided   Treatment provided  Cognitive-Linquistic      Pain Assessment   Pain Assessment  No/denies pain      Cognitive-Linquistic Treatment   Treatment focused on  Aphasia;Apraxia;Patient/family/caregiver education    Skilled Treatment  SLP provided skilled treatment during functional check writing task with use of template for cues for spelling. He used this for homework completion as well and reported that he could not have completed it without it. SLP provided verbal cues for writing out numbers. He is motivated to complete check writing tasks without assist from his spouse and was encouraged to utilize written template to assist at home. He also reports that he would like to be able to read the Bible. SLP provided only min phonemic cues during confrontation naming task (100% acc) with allowance for delays. During semantic features activity, [name] completed with 100% acc when provided min to moderate cues from SLP.       Assessment / Recommendations / Plan   Plan  Continue with current plan of care      Progression Toward Goals   Progression toward goals  Progressing toward goals       SLP Education - 10/02/18 1741    Education Details  Provided automatic writing task with template provided     Person(s) Educated  Patient;Spouse    Methods  Explanation;Handout    Comprehension  Verbalized understanding       SLP Short Term Goals - 10/02/18 1744      SLP  SHORT TERM GOAL #1   Title  Pt will increase naming of low frequency objects/pictures to 80% acc when provided with min/mod multimodality cues.   10/7 change to mi/mod cues   Baseline  70%    Time  4    Period  Weeks    Status  New      SLP SHORT TERM GOAL #2   Title  Pt will implement word-finding strategies with 90% accuracy when unable to verbalize desired word in conversation/functional tasks with mi/mod assist.    Baseline  Few attempts made to use strategies when blocked    Time  4    Period  Weeks    Status  On-going      SLP SHORT TERM GOAL #3   Title  Pt will describe objects and pictures by providing at least three salient features as judged by clinician with 90% acc when provided mi/mod cues.    Baseline  mod/max cues     Time  4    Period  Weeks    Status  On-going      SLP SHORT TERM GOAL #4   Title  Pt will increase MLU to 8+ words when describing pictures with use of scripted cues and carrier phrases as needed on 8/10 trials.   10/7 change to 8+   Baseline  4+    Time  4    Period  Weeks    Status  New      SLP SHORT TERM GOAL #5   Title  Pt will increase reading comprehension of short sentences with single word sentence completion and word bank provided to 90% acc with mi/mod assist.   change to 90% min assist    Baseline  50%    Time  4    Period  Weeks    Status  New      SLP SHORT TERM GOAL #6   Title  Pt will write single words with 75% acc when looking at pictures, discussing family or high interest topics with mod assist from SLP    Baseline  40-60%    Time  4    Period  Weeks    Status  On-going       SLP Long Term Goals - 10/02/18 1745      SLP LONG TERM GOAL #1   Title  Pt will increase reading comprehension for sentences to [facility] with use of strategies and min assist.     Baseline  mod assist    Time  8    Period  Weeks    Status  On-going      SLP LONG TERM GOAL #2   Title  Pt will be able to participate in a 5-minute conversation about topic of his choosing (personal interest) with use of written SLP cues for support and min assist.     Baseline  mod assist    Time  8    Period  Weeks    Status  On-going      SLP LONG TERM GOAL #3   Title  Pt will be able to write high frequency words with 90% acc with use of word bank/template as needed and indirect cues.     Baseline  50% and does not have template to choose from    Time  8    Period  Weeks    Status  On-going       Plan - 10/02/18 1741    Clinical Impression  Statement  Pt accompanied to therapy by his wife. Pt continues to be highly motivated and make excellent progress. He verbalized that he would like to work on writing, texting, and reading, which will be incorporated into sessions. Moderate expressive  aphasia persists. Next session, will spend some time helping Pt text on his phone if he brings to SLP session.     Speech Therapy Frequency  2x / week    Duration  4 weeks   8 weeks   Treatment/Interventions  Compensatory strategies;Cueing hierarchy;Functional tasks;Patient/family education;Multimodal communcation approach;Compensatory techniques;Internal/external aids;SLP instruction and feedback;Language facilitation    Potential to Achieve Goals  Good    Potential Considerations  --   time post stroke   SLP Home Exercise Plan  Pt will complete HEP as assigned to facilitate carryover of treatment strategies and techniques in home environment with written cues provided.    Consulted and Agree with Plan of Care  Patient       Patient will benefit from skilled therapeutic intervention in order to improve the following deficits and impairments:   Aphasia  Apraxia    Problem List There are no active problems to display for this patient.  Thank you,  Havery Moros, CCC-SLP [phone]  [facility] 10/02/2018, 5:50 PM  [facility] [address] [place], [place], [address] Phone: [phone]   Fax:  [phone]   Name: Fred Franzen MRN: [id number] Date of Birth: 11/13/1951

## 2018-10-06 ENCOUNTER — Ambulatory Visit (HOSPITAL_COMMUNITY): Payer: Medicare Other | Admitting: Speech Pathology

## 2018-10-06 ENCOUNTER — Encounter (HOSPITAL_COMMUNITY): Payer: Self-pay | Admitting: Speech Pathology

## 2018-10-06 DIAGNOSIS — R482 Apraxia: Secondary | ICD-10-CM

## 2018-10-06 DIAGNOSIS — R4701 Aphasia: Secondary | ICD-10-CM | POA: Diagnosis not present

## 2018-10-06 NOTE — Therapy (Signed)
[facility] [address] [place], [place], [address] Phone: [phone]   Fax:  [phone]  Speech Language Pathology Treatment  Patient Details  Name: [name] MRN: [id number] Date of Birth: 1951-08-20 Referring Provider (SLP): Jay Schlichter, MD   Encounter Date: 10/06/2018  End of Session - 10/06/18 1751    Visit Number  9    Number of Visits  25    Date for SLP Re-Evaluation  11/27/18    Authorization Type  Medicare     SLP Start Time  1430    SLP Stop Time   1518    SLP Time Calculation (min)  48 min    Activity Tolerance  Patient tolerated treatment well       History reviewed. No pertinent past medical history.  History reviewed. No pertinent surgical history.  There were no vitals filed for this visit.  Subjective Assessment - 10/06/18 1750    Subjective  "I went to my reunion."    Patient is accompained by:  Family member    Currently in Pain?  No/denies       ADULT SLP TREATMENT - 10/06/18 0001      General Information   Behavior/Cognition  Alert;Cooperative;Pleasant mood    Patient Positioning  Upright in chair    Oral care provided  N/A    HPI  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 He received outpatient SLP therapy in [place] from 09/2017 to 02/2018 when he was discharged due to Pt wanting to take a break from therapy (long drive for family). He had made excellent gains during treatment. He elected to resume SLP therapy closer to his home in [place] over the summer, but felt like it was not a good fit for his current needs. He is here today accompanied by his wife, [name].      Treatment Provided   Treatment provided  Cognitive-Linquistic      Pain Assessment   Pain Assessment  No/denies pain      Cognitive-Linquistic Treatment   Treatment focused on  Aphasia;Apraxia;Patient/family/caregiver education    Skilled  Treatment SLP provided skilled treatment during expressive language expansion tasks. Pt able to read single words with 100% acc and use in a sentence with 95% acc when provided with min cues. SLP further challenged Pt to expand sentences by answering "wh" questions with mi/mod cues. SLP provided only min phonemic cues during confrontation naming task (100% acc) with allowance for delays.       Assessment / Recommendations / Plan   Plan  Continue with current plan of care      Progression Toward Goals   Progression toward goals  Progressing toward goals         SLP Short Term Goals - 10/06/18 1751      SLP SHORT TERM GOAL #1   Title  Pt will increase naming of low frequency objects/pictures to 80% acc when provided with min/mod multimodality cues.   10/7 change to mi/mod cues   Baseline  70%    Time  4    Period  Weeks    Status  On-going      SLP SHORT TERM GOAL #2   Title  Pt will implement word-finding strategies with 90% accuracy when unable to verbalize desired word in conversation/functional tasks with mi/mod assist.    Baseline  Few attempts made to use strategies when  blocked    Time  4    Period  Weeks    Status  On-going      SLP SHORT TERM GOAL #3   Title  Pt will describe objects and pictures by providing at least three salient features as judged by clinician with 90% acc when provided mi/mod cues.    Baseline  mod/max cues    Time  4    Period  Weeks    Status  On-going      SLP SHORT TERM GOAL #4   Title  Pt will increase MLU to 8+ words when describing pictures with use of scripted cues and carrier phrases as needed on 8/10 trials.   10/7 change to 8+   Baseline  4+    Time  4    Period  Weeks    Status  On-going      SLP SHORT TERM GOAL #5   Title  Pt will increase reading comprehension of short sentences with single word sentence completion and word bank provided to 90% acc with min assist.   change to 90% min assist    Baseline  50%    Time  4     Period  Weeks    Status  On-going      SLP SHORT TERM GOAL #6   Title  Pt will write single words with 75% acc when looking at pictures, discussing family or high interest topics with mod assist from SLP    Baseline  40-60%    Time  4    Period  Weeks    Status  On-going       SLP Long Term Goals - 10/06/18 1753      SLP LONG TERM GOAL #1   Title  Pt will increase reading comprehension for sentences to [facility] with use of strategies and min assist.     Baseline  mod assist    Time  8    Period  Weeks    Status  On-going      SLP LONG TERM GOAL #2   Title  Pt will be able to participate in a 5-minute conversation about topic of his choosing (personal interest) with use of written SLP cues for support and min assist.     Baseline  mod assist    Time  8    Period  Weeks    Status  On-going      SLP LONG TERM GOAL #3   Title  Pt will be able to write high frequency words with 90% acc with use of word bank/template as needed and indirect cues.     Baseline  50% and does not have template to choose from    Time  8    Period  Weeks    Status  On-going       Plan - 10/06/18 1751    Clinical Impression Statement  Pt accompanied to therapy by his wife. Pt continues to be highly motivated and make excellent progress. He verbalized that he would like to work on writing, texting, and reading, which will be incorporated into sessions. Moderate expressive aphasia persists. Next session, will spend some time helping Pt text on his phone if he brings to SLP session.     Speech Therapy Frequency  2x / week    Duration  4 weeks   8 weeks   Treatment/Interventions  Compensatory strategies;Cueing hierarchy;Functional tasks;Patient/family education;Multimodal communcation approach;Compensatory techniques;Internal/external aids;SLP instruction and feedback;Language facilitation  Potential to Achieve Goals  Good    Potential Considerations  --   time post stroke   SLP Home Exercise Plan  Pt will  complete HEP as assigned to facilitate carryover of treatment strategies and techniques in home environment with written cues provided.    Consulted and Agree with Plan of Care  Patient       Patient will benefit from skilled therapeutic intervention in order to improve the following deficits and impairments:   Aphasia  Apraxia    Problem List There are no active problems to display for this patient.  Thank you,  Havery Moros, CCC-SLP [phone]  [facility] 10/06/2018, 5:53 PM  [facility] [address] [place], [place], [address] Phone: [phone]   Fax:  [phone]   Name: [name] MRN: [id number] Date of Birth: 23-Jun-1951

## 2018-10-08 ENCOUNTER — Ambulatory Visit (HOSPITAL_COMMUNITY): Payer: Medicare Other | Admitting: Speech Pathology

## 2018-10-08 ENCOUNTER — Encounter (HOSPITAL_COMMUNITY): Payer: Self-pay | Admitting: Speech Pathology

## 2018-10-08 DIAGNOSIS — R4701 Aphasia: Secondary | ICD-10-CM | POA: Diagnosis not present

## 2018-10-08 DIAGNOSIS — R482 Apraxia: Secondary | ICD-10-CM

## 2018-10-08 NOTE — Therapy (Signed)
[facility] [address] [place], [place], [address] Phone: [phone]   Fax:  [phone]  Speech Language Pathology Treatment  Patient Details  Name: [name] MRN: [id number] Date of Birth: [birth year] Referring Provider (SLP): Jay Schlichter, MD   Encounter Date: 10/08/2018  End of Session - 10/08/18 1058    Visit Number  10    Number of Visits  25    Date for SLP Re-Evaluation  11/27/18    Authorization Type  Medicare     SLP Start Time  0945    SLP Stop Time   1030    SLP Time Calculation (min)  45 min    Activity Tolerance  Patient tolerated treatment well       History reviewed. No pertinent past medical history.  History reviewed. No pertinent surgical history.  There were no vitals filed for this visit.  Subjective Assessment - 10/08/18 1057    Subjective  "I can't."    Patient is accompained by:  Family member    Currently in Pain?  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 He received outpatient SLP therapy in [place] from 09/2017 to 02/2018 when he was discharged due to Pt wanting to take a break from therapy (long drive for family). He had made excellent gains during treatment. He elected to resume SLP therapy closer to his home in [place] over the summer, but felt like it was not a good fit for his current needs. He is here today accompanied by his wife, [name].      Treatment Provided   Treatment provided  Cognitive-Linquistic      Pain Assessment   Pain Assessment  No/denies pain      Cognitive-Linquistic Treatment   Treatment focused on  Aphasia;Apraxia;Patient/family/caregiver education    Skilled Treatment   SLP provided skilled treatment during functional oral reading of multisyllabic words, word repetition tasks, sentence completion tasks, answering "Who" questions, and providing verbal sequence for completing an oil change. He read repeated 4-5 syllable words with 100% acc with min cues. He was able to repeat words x3 with 83% acc; orally read multisyllabic words with 83 % acc with min cues, and answer "who" questions with 100% acc when provided mod cues.        Assessment / Recommendations / Plan   Plan  Continue with current plan of care      Progression Toward Goals   Progression toward goals  Progressing toward goals         SLP Short Term Goals - 10/08/18 1103      SLP SHORT TERM GOAL #1   Title  Pt will increase naming of low frequency objects/pictures to 80% acc when provided with min/mod multimodality cues.   10/7 change to mi/mod cues   Baseline  70%    Time  4    Period  Weeks    Status  On-going      SLP SHORT TERM GOAL #2   Title  Pt will implement word-finding strategies with 90% accuracy when unable to verbalize desired word in conversation/functional tasks with mi/mod assist.  Baseline  Few attempts made to use strategies when blocked    Time  4    Period  Weeks    Status  On-going      SLP SHORT TERM GOAL #3   Title  Pt will describe objects and pictures by providing at least three salient features as judged by clinician with 90% acc when provided mi/mod cues.    Baseline  mod/max cues    Time  4    Period  Weeks    Status  On-going      SLP SHORT TERM GOAL #4   Title  Pt will increase MLU to 8+ words when describing pictures with use of scripted cues and carrier phrases as needed on 8/10 trials.   10/7 change to 8+   Baseline  4+    Time  4    Period  Weeks    Status  On-going      SLP SHORT TERM GOAL #5   Title  Pt will increase reading comprehension of short sentences with single word sentence completion and word bank provided to 90% acc with min  assist.   change to 90% min assist    Baseline  50%    Time  4    Period  Weeks    Status  On-going      SLP SHORT TERM GOAL #6   Title  Pt will write single words with 75% acc when looking at pictures, discussing family or high interest topics with mod assist from SLP    Baseline  40-60%    Time  4    Period  Weeks    Status  On-going       SLP Long Term Goals - 10/08/18 1103      SLP LONG TERM GOAL #1   Title  Pt will increase reading comprehension for sentences to [facility] with use of strategies and min assist.     Baseline  mod assist    Time  8    Period  Weeks    Status  On-going      SLP LONG TERM GOAL #2   Title  Pt will be able to participate in a 5-minute conversation about topic of his choosing (personal interest) with use of written SLP cues for support and min assist.     Baseline  mod assist    Time  8    Period  Weeks    Status  On-going      SLP LONG TERM GOAL #3   Title  Pt will be able to write high frequency words with 90% acc with use of word bank/template as needed and indirect cues.     Baseline  50% and does not have template to choose from    Time  8    Period  Weeks    Status  On-going       Plan - 10/08/18 1102    Clinical Impression Statement  Pt accompanied to therapy by his wife. Pt continues to be highly motivated and make good progress. Moderate expressive aphasia persists. He benefits from phonemic and sentence completion cues during expressive language tasks. Next session, have Pt provide verbal sequences in structured activities. SLP will look to obtain list of mechanic vocabulary to use in session.      Speech Therapy Frequency  2x / week    Duration  4 weeks   8 weeks   Treatment/Interventions  Compensatory strategies;Cueing hierarchy;Functional tasks;Patient/family education;Multimodal communcation approach;Compensatory  techniques;Internal/external aids;SLP instruction and feedback;Language facilitation    Potential to Achieve Goals  Good     Potential Considerations  --   time post stroke   SLP Home Exercise Plan  Pt will complete HEP as assigned to facilitate carryover of treatment strategies and techniques in home environment with written cues provided.    Consulted and Agree with Plan of Care  Patient       Patient will benefit from skilled therapeutic intervention in order to improve the following deficits and impairments:   Aphasia  Apraxia    Problem List There are no active problems to display for this patient.  Thank you,  Havery Moros, CCC-SLP [phone]  [facility] 10/08/2018, 11:04 AM   [facility] [address] [place], [place], [address] Phone: [phone]   Fax:  [phone]   Name: [name] MRN: [id number] Date of Birth: 06-09-1951

## 2018-10-13 ENCOUNTER — Encounter (HOSPITAL_COMMUNITY): Payer: Self-pay | Admitting: Speech Pathology

## 2018-10-13 ENCOUNTER — Ambulatory Visit (HOSPITAL_COMMUNITY): Payer: Medicare Other | Admitting: Speech Pathology

## 2018-10-13 DIAGNOSIS — R482 Apraxia: Secondary | ICD-10-CM

## 2018-10-13 DIAGNOSIS — R4701 Aphasia: Secondary | ICD-10-CM

## 2018-10-13 NOTE — Therapy (Signed)
[facility] [address] [place], [place], [address] Phone: [phone]   Fax:  [phone]  Speech Language Pathology Treatment  Patient Details  Name: [name] MRN: [id number] Date of Birth: Mar 12, 1951 Referring Provider (SLP): Jay Schlichter, MD   Encounter Date: 10/13/2018  End of Session - 10/13/18 1524    Visit Number  11    Number of Visits  25    Date for SLP Re-Evaluation  11/27/18    Authorization Type  Medicare     SLP Start Time  1430    SLP Stop Time   1520    SLP Time Calculation (min)  50 min    Activity Tolerance  Patient tolerated treatment well       History reviewed. No pertinent past medical history.  History reviewed. No pertinent surgical history.  There were no vitals filed for this visit.  Subjective Assessment - 10/13/18 1524    Subjective  "I went away this weekend."    Patient is accompained by:  Family member    Currently in Pain?  No/denies       ADULT SLP TREATMENT - 10/13/18 0001      General Information   Behavior/Cognition  Alert;Cooperative;Pleasant mood    Patient Positioning  Upright in chair    Oral care provided  N/A    HPI  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 He received outpatient SLP therapy in [place] from 09/2017 to 02/2018 when he was discharged due to Pt wanting to take a break from therapy (long drive for family). He had made excellent gains during treatment. He elected to resume SLP therapy closer to his home in [place] over the summer, but felt like it was not a good fit for his current needs. He is here today accompanied by his wife, [name].      Treatment Provided   Treatment provided  Cognitive-Linquistic      Pain Assessment   Pain Assessment  No/denies pain      Cognitive-Linquistic Treatment   Treatment focused on  Aphasia;Apraxia;Patient/family/caregiver education    Skilled Treatment SLP provided skilled treatment targeting expressive language goals during Pt directed conversation regarding weekend activities, Pt providing driving directions from his house to SLP office, and during picture description tasks. Pt utilized SLP written cues to help direct conversation regarding the trip he took over the weekend. Pt needed min cues from Google maps to provide step by step directions. SLP used VNest techniques to expand on Pt's utterances when describing pictures.         Assessment / Recommendations / Plan   Plan  Continue with current plan of care      Progression Toward Goals   Progression toward goals  Progressing toward goals         SLP Short Term Goals - 10/13/18 1525      SLP SHORT TERM GOAL #1   Title  Pt will increase naming of low frequency objects/pictures to 80% acc when provided with min/mod multimodality cues.   10/7 change to mi/mod cues   Baseline  70%    Time  4    Period  Weeks    Status  On-going      SLP SHORT TERM GOAL #2   Title  Pt will implement word-finding strategies with 90% accuracy when unable to verbalize desired word in conversation/functional tasks with mi/mod assist.  Baseline  Few attempts made to use strategies when blocked    Time  4    Period  Weeks    Status  On-going      SLP SHORT TERM GOAL #3   Title  Pt will describe objects and pictures by providing at least three salient features as judged by clinician with 90% acc when provided mi/mod cues.    Baseline  mod/max cues    Time  4    Period  Weeks    Status  On-going      SLP SHORT TERM GOAL #4   Title  Pt will increase MLU to 8+ words when describing pictures with use of scripted cues and carrier phrases as needed on 8/10 trials.   10/7 change to 8+   Baseline  4+    Time  4    Period  Weeks    Status  On-going      SLP SHORT TERM GOAL #5   Title  Pt will increase reading comprehension of short sentences with single word sentence completion and  word bank provided to 90% acc with min assist.   change to 90% min assist    Baseline  50%    Time  4    Period  Weeks    Status  On-going      SLP SHORT TERM GOAL #6   Title  Pt will write single words with 75% acc when looking at pictures, discussing family or high interest topics with mod assist from SLP    Baseline  40-60%    Time  4    Period  Weeks    Status  On-going       SLP Long Term Goals - 10/13/18 1525      SLP LONG TERM GOAL #1   Title  Pt will increase reading comprehension for sentences to [facility] with use of strategies and min assist.     Baseline  mod assist    Time  8    Period  Weeks    Status  On-going      SLP LONG TERM GOAL #2   Title  Pt will be able to participate in a 5-minute conversation about topic of his choosing (personal interest) with use of written SLP cues for support and min assist.     Baseline  mod assist    Time  8    Period  Weeks    Status  On-going      SLP LONG TERM GOAL #3   Title  Pt will be able to write high frequency words with 90% acc with use of word bank/template as needed and indirect cues.     Baseline  50% and does not have template to choose from    Time  8    Period  Weeks    Status  On-going       Plan - 10/13/18 1525    Clinical Impression Statement  Pt accompanied to therapy by his wife. Pt continues to be highly motivated and make good progress. Moderate expressive aphasia persists. He benefited from written and semantic cueing during verbal expression tasks. Next session, plan to use VNeST techniques in session.   Speech Therapy Frequency  2x / week    Duration  4 weeks   8 weeks   Treatment/Interventions  Compensatory strategies;Cueing hierarchy;Functional tasks;Patient/family education;Multimodal communcation approach;Compensatory techniques;Internal/external aids;SLP instruction and feedback;Language facilitation    Potential to Achieve Goals  Good  Potential Considerations  --   time post stroke   SLP  Home Exercise Plan  Pt will complete HEP as assigned to facilitate carryover of treatment strategies and techniques in home environment with written cues provided.    Consulted and Agree with Plan of Care  Patient       Patient will benefit from skilled therapeutic intervention in order to improve the following deficits and impairments:   Aphasia  Apraxia    Problem List There are no active problems to display for this patient.  Thank you,  Havery Moros, CCC-SLP [phone]  [facility] 10/13/2018, 3:26 PM  [facility] [address] [place], [place], [address] Phone: [phone]   Fax:  [phone]   Name: [name] MRN: [id number] Date of Birth: [birth year]

## 2018-10-15 ENCOUNTER — Encounter (HOSPITAL_COMMUNITY): Payer: Self-pay | Admitting: Speech Pathology

## 2018-10-15 ENCOUNTER — Ambulatory Visit (HOSPITAL_COMMUNITY): Payer: Medicare Other | Admitting: Speech Pathology

## 2018-10-15 DIAGNOSIS — R482 Apraxia: Secondary | ICD-10-CM

## 2018-10-15 DIAGNOSIS — R4701 Aphasia: Secondary | ICD-10-CM

## 2018-10-15 NOTE — Therapy (Signed)
[facility] [address] [place], [place], [address] Phone: [phone]   Fax:  [phone]  Speech Language Pathology Treatment  Patient Details  Name: [name] MRN: [id number] Date of Birth: April 04, 1951 Referring Provider (SLP): Jay Schlichter, MD   Encounter Date: 10/15/2018  End of Session - 10/15/18 1319    Visit Number  12    Number of Visits  25    Date for SLP Re-Evaluation  11/27/18    Authorization Type  Medicare     SLP Start Time  0945    SLP Stop Time   1030    SLP Time Calculation (min)  45 min    Activity Tolerance  Patient tolerated treatment well       History reviewed. No pertinent past medical history.  History reviewed. No pertinent surgical history.  There were no vitals filed for this visit.  Subjective Assessment - 10/15/18 1303    Subjective  "I like to work hard."    Currently in Pain?  No/denies       ADULT SLP TREATMENT - 10/15/18 1304      General Information   Behavior/Cognition  Alert;Cooperative;Pleasant mood    Patient Positioning  Upright in chair    Oral care provided  N/A    HPI  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 He received outpatient SLP therapy in [place] from 09/2017 to 02/2018 when he was discharged due to Pt wanting to take a break from therapy (long drive for family). He had made excellent gains during treatment. He elected to resume SLP therapy closer to his home in [place] over the summer, but felt like it was not a good fit for his current needs. He is here today accompanied by his wife, [name].      Treatment Provided   Treatment provided  Cognitive-Linquistic      Pain Assessment   Pain Assessment  No/denies pain      Cognitive-Linquistic Treatment   Treatment focused on  Aphasia;Apraxia;Patient/family/caregiver education    Skilled Treatment  SLP provided skilled treatment  targeting expressive language goals during Pt directed conversation regarding yard repair project at home. Pt asked to verbally describe the problematic situation in his yard (dirt delivered previously has washed away and created a mess) and explain the steps to repair. He was encouraged to draw sketches to supplement verbal expression due to evasive language ("thing"). SLP guided discussion with verbal and written cues to answer questions related to goal of project, where, and what supplies are needed. Pt benefited from verbal script during role playing scenario of purchasing needed items to complete task.      Assessment / Recommendations / Plan   Plan  Continue with current plan of care      Progression Toward Goals   Progression toward goals  Progressing toward goals       SLP Education - 10/15/18 1318    Education Details  Provided Pt with small notebook to carry in pocket with verbal script to use as needed in the community.    Person(s) Educated  Patient;Spouse    Methods  Explanation;Handout    Comprehension  Verbalized understanding       SLP Short Term Goals - 10/15/18 1327      SLP SHORT TERM GOAL #1   Title  Pt will increase naming of low frequency objects/pictures to 80% acc when  provided with min/mod multimodality cues.   10/7 change to mi/mod cues   Baseline  70%    Time  4    Period  Weeks    Status  On-going      SLP SHORT TERM GOAL #2   Title  Pt will implement word-finding strategies with 90% accuracy when unable to verbalize desired word in conversation/functional tasks with mi/mod assist.    Baseline  Few attempts made to use strategies when blocked    Time  4    Period  Weeks    Status  On-going      SLP SHORT TERM GOAL #3   Title  Pt will describe objects and pictures by providing at least three salient features as judged by clinician with 90% acc when provided mi/mod cues.    Baseline  mod/max cues    Time  4    Period  Weeks    Status  On-going       SLP SHORT TERM GOAL #4   Title  Pt will increase MLU to 8+ words when describing pictures with use of scripted cues and carrier phrases as needed on 8/10 trials.   10/7 change to 8+   Baseline  4+    Time  4    Period  Weeks    Status  On-going      SLP SHORT TERM GOAL #5   Title  Pt will increase reading comprehension of short sentences with single word sentence completion and word bank provided to 90% acc with min assist.   change to 90% min assist    Baseline  50%    Time  4    Period  Weeks    Status  On-going      SLP SHORT TERM GOAL #6   Title  Pt will write single words with 75% acc when looking at pictures, discussing family or high interest topics with mod assist from SLP    Baseline  40-60%    Time  4    Period  Weeks    Status  On-going       SLP Long Term Goals - 10/15/18 1327      SLP LONG TERM GOAL #1   Title  Pt will increase reading comprehension for sentences to [facility] with use of strategies and min assist.     Baseline  mod assist    Time  8    Period  Weeks    Status  On-going      SLP LONG TERM GOAL #2   Title  Pt will be able to participate in a 5-minute conversation about topic of his choosing (personal interest) with use of written SLP cues for support and min assist.     Baseline  mod assist    Time  8    Period  Weeks    Status  On-going      SLP LONG TERM GOAL #3   Title  Pt will be able to write high frequency words with 90% acc with use of word bank/template as needed and indirect cues.     Baseline  50% and does not have template to choose from    Time  8    Period  Weeks    Status  On-going       Plan - 10/15/18 1320    Clinical Impression Statement  Pt accompanied to therapy by his wife. She relayed that Pt had a difficult time communicating with a  distant friend on the phone regarding a car restoration project. He was unable to accurately express his thoughts and his friend did not receive the correct message. SLP encouraged Pt and his  wife to spend a few minutes preparing for conversations with others (phone calls, shopping trips, etc) with each other prior to engaging in conversations. He was also encouraged to have his wife write down key words and numbers to clarify his intended message. He expressed resistance to using written cues when engaging in conversations with others. He was given a small notebook to use today when he goes to the store with a verbal script to use as needed. Pt continues to make good progress in therapy. Moderate expressive aphasia perists. Continue targeting short term goals next session.    Speech Therapy Frequency  2x / week    Duration  4 weeks   8 weeks   Treatment/Interventions  Compensatory strategies;Cueing hierarchy;Functional tasks;Patient/family education;Multimodal communcation approach;Compensatory techniques;Internal/external aids;SLP instruction and feedback;Language facilitation    Potential to Achieve Goals  Good    Potential Considerations  --   time post stroke   SLP Home Exercise Plan  Pt will complete HEP as assigned to facilitate carryover of treatment strategies and techniques in home environment with written cues provided.    Consulted and Agree with Plan of Care  Patient       Patient will benefit from skilled therapeutic intervention in order to improve the following deficits and impairments:   Aphasia  Apraxia    Problem List There are no active problems to display for this patient.  Thank you,  Havery Moros, CCC-SLP [phone]  [facility] 10/15/2018, 1:28 PM  [facility] [address] [place], [place], [address] Phone: [phone]   Fax:  [phone]   Name: Hillel Card MRN: [id number] Date of Birth: [birth year]

## 2018-10-20 ENCOUNTER — Ambulatory Visit (HOSPITAL_COMMUNITY): Payer: Medicare Other | Admitting: Speech Pathology

## 2018-10-20 ENCOUNTER — Encounter (HOSPITAL_COMMUNITY): Payer: Self-pay | Admitting: Speech Pathology

## 2018-10-20 DIAGNOSIS — R4701 Aphasia: Secondary | ICD-10-CM

## 2018-10-20 DIAGNOSIS — R482 Apraxia: Secondary | ICD-10-CM

## 2018-10-20 NOTE — Therapy (Signed)
[facility] [address] [place], [place], [address] Phone: [phone]   Fax:  [phone]  Speech Language Pathology Treatment  Patient Details  Name: [name] MRN: [id number] Date of Birth: 03-15-1951 Referring Provider (SLP): Jay Schlichter, MD   Encounter Date: 10/20/2018  End of Session - 10/20/18 1523    Visit Number  13    Number of Visits  25    Date for SLP Re-Evaluation  11/27/18    Authorization Type  Medicare     SLP Start Time  1430    SLP Stop Time   1520    SLP Time Calculation (min)  50 min    Activity Tolerance  Patient tolerated treatment well       History reviewed. No pertinent past medical history.  History reviewed. No pertinent surgical history.  There were no vitals filed for this visit.  Subjective Assessment - 10/20/18 1445    Subjective  "I have more movement in this (right hand)."    Patient is accompained by:  Family member    Currently in Pain?  No/denies       ADULT SLP TREATMENT - 10/20/18 0001      General Information   Behavior/Cognition  Alert;Cooperative;Pleasant mood    Patient Positioning  Upright in chair    Oral care provided  N/A    HPI  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 He received outpatient SLP therapy in [place] from 09/2017 to 02/2018 when he was discharged due to Pt wanting to take a break from therapy (long drive for family). He had made excellent gains during treatment. He elected to resume SLP therapy closer to his home in [place] over the summer, but felt like it was not a good fit for his current needs. He is here today accompanied by his wife, [name].      Treatment Provided   Treatment provided  Cognitive-Linquistic      Pain Assessment   Pain Assessment  No/denies pain      Cognitive-Linquistic Treatment   Treatment focused on  Aphasia;Apraxia;Patient/family/caregiver  education    Skilled Treatment  SLP provided skilled treatment targeting expressive language goals during Pt directed conversation regarding yard repair project at home. He did not use the script we created last session when he went back to the home improvement store because he decided to complete the task at home in a different manner. SLP provided min to mild/mod verbal probes and written cues to supplement Pt's descriptions. SLP also provided skilled education regarding changing dynamics between Pt and spouse given communication changes. Mr. Going acknowledged that he feels that his wife jumps in too quickly at times, but that he wants her to help when he is stuck when they are out in the community.      Assessment / Recommendations / Plan   Plan  Continue with current plan of care         SLP Short Term Goals - 10/20/18 1524      SLP SHORT TERM GOAL #1   Title  Pt will increase naming of low frequency objects/pictures to 80% acc when provided with min/mod multimodality cues.   10/7 change to mi/mod cues   Baseline  70%    Time  4    Period  Weeks    Status  On-going      SLP SHORT TERM GOAL #  2   Title  Pt will implement word-finding strategies with 90% accuracy when unable to verbalize desired word in conversation/functional tasks with mi/mod assist.    Baseline  Few attempts made to use strategies when blocked    Time  4    Period  Weeks    Status  On-going      SLP SHORT TERM GOAL #3   Title  Pt will describe objects and pictures by providing at least three salient features as judged by clinician with 90% acc when provided mi/mod cues.    Baseline  mod/max cues    Time  4    Period  Weeks    Status  On-going      SLP SHORT TERM GOAL #4   Title  Pt will increase MLU to 8+ words when describing pictures with use of scripted cues and carrier phrases as needed on 8/10 trials.   10/7 change to 8+   Baseline  4+    Time  4    Period  Weeks    Status  On-going      SLP  SHORT TERM GOAL #5   Title  Pt will increase reading comprehension of short sentences with single word sentence completion and word bank provided to 90% acc with min assist.   change to 90% min assist    Baseline  50%    Time  4    Period  Weeks    Status  On-going      SLP SHORT TERM GOAL #6   Title  Pt will write single words with 75% acc when looking at pictures, discussing family or high interest topics with mod assist from SLP    Baseline  40-60%    Time  4    Period  Weeks    Status  On-going       SLP Long Term Goals - 10/20/18 1525      SLP LONG TERM GOAL #1   Title  Pt will increase reading comprehension for sentences to [facility] with use of strategies and min assist.     Baseline  mod assist    Time  8    Period  Weeks    Status  On-going      SLP LONG TERM GOAL #2   Title  Pt will be able to participate in a 5-minute conversation about topic of his choosing (personal interest) with use of written SLP cues for support and min assist.     Baseline  mod assist    Time  8    Period  Weeks    Status  On-going      SLP LONG TERM GOAL #3   Title  Pt will be able to write high frequency words with 90% acc with use of word bank/template as needed and indirect cues.     Baseline  50% and does not have template to choose from    Time  8    Period  Weeks    Status  On-going       Plan - 10/20/18 1524    Clinical Impression Statement  Pt accompanied to therapy by his wife. Both seemed to appreciate open dialogue and education regarding changing roles due to communication changes. Pt continues to benefit from written cues (key words and names) when participating in conversations. He was encouraged to use the notebook given to him during previous session to write down frequently used names and vocabulary. Pt continues to make  good progress in therapy. Moderate expressive aphasia perists. Continue targeting short term goals next session.    Speech Therapy Frequency  2x / week     Duration  4 weeks   8 weeks   Treatment/Interventions  Compensatory strategies;Cueing hierarchy;Functional tasks;Patient/family education;Multimodal communcation approach;Compensatory techniques;Internal/external aids;SLP instruction and feedback;Language facilitation    Potential to Achieve Goals  Good    Potential Considerations  --   time post stroke   SLP Home Exercise Plan  Pt will complete HEP as assigned to facilitate carryover of treatment strategies and techniques in home environment with written cues provided.    Consulted and Agree with Plan of Care  Patient       Patient will benefit from skilled therapeutic intervention in order to improve the following deficits and impairments:   Aphasia  Apraxia    Problem List There are no active problems to display for this patient.  Thank you,  Havery Moros, CCC-SLP [phone]  [facility] 10/20/2018, 3:33 PM  [facility] [address] [place], [place], [address] Phone: [phone]   Fax:  [phone]   Name: [name] MRN: [id number] Date of Birth: [birth year]

## 2018-10-22 ENCOUNTER — Encounter (HOSPITAL_COMMUNITY): Payer: PRIVATE HEALTH INSURANCE | Admitting: Speech Pathology

## 2018-10-23 ENCOUNTER — Encounter (HOSPITAL_COMMUNITY): Payer: Self-pay | Admitting: Speech Pathology

## 2018-10-23 ENCOUNTER — Ambulatory Visit (HOSPITAL_COMMUNITY): Payer: Medicare Other | Admitting: Speech Pathology

## 2018-10-23 DIAGNOSIS — R4701 Aphasia: Secondary | ICD-10-CM

## 2018-10-23 DIAGNOSIS — R482 Apraxia: Secondary | ICD-10-CM

## 2018-10-23 NOTE — Therapy (Signed)
[facility] [address] [place], [place], [address] Phone: [phone]   Fax:  [phone]  Speech Language Pathology Treatment  Patient Details  Name: [name] MRN: [id number] Date of Birth: 1951-12-03 Referring Provider (SLP): Jay Schlichter, MD   Encounter Date: 10/23/2018  End of Session - 10/23/18 1425    Visit Number  14    Number of Visits  25    Date for SLP Re-Evaluation  11/27/18    Authorization Type  Medicare     SLP Start Time  1330    SLP Stop Time   1420    SLP Time Calculation (min)  50 min    Activity Tolerance  Patient tolerated treatment well       History reviewed. No pertinent past medical history.  History reviewed. No pertinent surgical history.  There were no vitals filed for this visit.  Subjective Assessment - 10/23/18 1335    Subjective  "For the past two days I was working on the car."    Patient is accompained by:  Family member    Currently in Pain?  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 He received outpatient SLP therapy in [place] from 09/2017 to 02/2018 when he was discharged due to Pt wanting to take a break from therapy (long drive for family). He had made excellent gains during treatment. He elected to resume SLP therapy closer to his home in [place] over the summer, but felt like it was not a good fit for his current needs. He is here today accompanied by his wife, [name].      Treatment Provided   Treatment provided  Cognitive-Linquistic      Pain Assessment   Pain Assessment  No/denies pain      Cognitive-Linquistic Treatment   Treatment focused on   Aphasia;Apraxia;Patient/family/caregiver education    Skilled Treatment  SLP provided skilled treatment targeting expressive language goals during structured task involving naming basic pictures, spelling/writing the words, providing three descriptive features, and using the word in a sentence. He named the words with 100% acc, spelled the first letter of each word with 100% acc with min cue, created a 5-8 word sentence with 100% acc with min cue, and named descriptive features with 100% acc when provided mod cues via "wh" question probes. Letter tiles were used to assist Pt with spelling, however he needed mod cues.      Assessment / Recommendations / Plan   Plan  Continue with current plan of care      Progression Toward Goals   Progression toward goals  Progressing toward goals         SLP Short Term Goals - 10/23/18 1425      SLP SHORT TERM GOAL #1   Title  Pt will increase naming of low frequency objects/pictures to 80% acc when provided with min/mod multimodality cues.   10/7 change to mi/mod cues   Baseline  70%    Time  4    Period  Weeks    Status  On-going      SLP SHORT TERM GOAL #2  Title  Pt will implement word-finding strategies with 90% accuracy when unable to verbalize desired word in conversation/functional tasks with mi/mod assist.    Baseline  Few attempts made to use strategies when blocked    Time  4    Period  Weeks    Status  On-going      SLP SHORT TERM GOAL #3   Title  Pt will describe objects and pictures by providing at least three salient features as judged by clinician with 90% acc when provided mi/mod cues.    Baseline  mod/max cues    Time  4    Period  Weeks    Status  On-going      SLP SHORT TERM GOAL #4   Title  Pt will increase MLU to 8+ words when describing pictures with use of scripted cues and carrier phrases as needed on 8/10 trials.   10/7 change to 8+   Baseline  4+    Time  4    Period  Weeks    Status  On-going      SLP SHORT  TERM GOAL #5   Title  Pt will increase reading comprehension of short sentences with single word sentence completion and word bank provided to 90% acc with min assist.   change to 90% min assist    Baseline  50%    Time  4    Period  Weeks    Status  On-going      SLP SHORT TERM GOAL #6   Title  Pt will write single words with 75% acc when looking at pictures, discussing family or high interest topics with mod assist from SLP    Baseline  40-60%    Time  4    Period  Weeks    Status  On-going       SLP Long Term Goals - 10/23/18 1426      SLP LONG TERM GOAL #1   Title  Pt will increase reading comprehension for sentences to [facility] with use of strategies and min assist.     Baseline  mod assist    Time  8    Period  Weeks    Status  On-going      SLP LONG TERM GOAL #2   Title  Pt will be able to participate in a 5-minute conversation about topic of his choosing (personal interest) with use of written SLP cues for support and min assist.     Baseline  mod assist    Time  8    Period  Weeks    Status  On-going      SLP LONG TERM GOAL #3   Title  Pt will be able to write high frequency words with 90% acc with use of word bank/template as needed and indirect cues.     Baseline  50% and does not have template to choose from    Time  8    Period  Weeks    Status  On-going       Plan - 10/23/18 1425    Clinical Impression Statement  Pt accompanied to therapy by his wife. Moderate expressive aphasia perists. Pt is initiating conversations more and attempts to utilize encouraged strategies. He and is wife were encouraged to write additional names in his small notebook. Continue targeting short term goals next session.    Speech Therapy Frequency  2x / week    Duration  4 weeks   8 weeks  Treatment/Interventions  Compensatory strategies;Cueing hierarchy;Functional tasks;Patient/family education;Multimodal communcation approach;Compensatory techniques;Internal/external aids;SLP  instruction and feedback;Language facilitation    Potential to Achieve Goals  Good    Potential Considerations  --   time post stroke   SLP Home Exercise Plan  Pt will complete HEP as assigned to facilitate carryover of treatment strategies and techniques in home environment with written cues provided.    Consulted and Agree with Plan of Care  Patient       Patient will benefit from skilled therapeutic intervention in order to improve the following deficits and impairments:   Aphasia  Apraxia    Problem List There are no active problems to display for this patient.  Thank you,  Havery Moros, CCC-SLP [phone]  Havery Moros 10/23/2018, 2:26 PM  [facility] [address] [place], [place], [address] Phone: [phone]   Fax:  [phone]   Name: Knute Mazzuca MRN: [id number] Date of Birth: 14-May-1951

## 2018-10-28 ENCOUNTER — Ambulatory Visit (HOSPITAL_COMMUNITY): Payer: Medicare Other | Attending: Neurology | Admitting: Speech Pathology

## 2018-10-28 ENCOUNTER — Encounter (HOSPITAL_COMMUNITY): Payer: Self-pay | Admitting: Speech Pathology

## 2018-10-28 DIAGNOSIS — R482 Apraxia: Secondary | ICD-10-CM | POA: Insufficient documentation

## 2018-10-28 DIAGNOSIS — R4701 Aphasia: Secondary | ICD-10-CM | POA: Diagnosis not present

## 2018-10-28 NOTE — Therapy (Signed)
[facility] [address] [place], [place], [address] Phone: [phone]   Fax:  [phone]  Speech Language Pathology Treatment  Patient Details  Name: [name] MRN: [id number] Date of Birth: 1951/08/23 Referring Provider (SLP): Jay Schlichter, MD   Encounter Date: 10/28/2018  End of Session - 10/28/18 1823    Visit Number  15    Number of Visits  25    Date for SLP Re-Evaluation  11/27/18    Authorization Type  Medicare     SLP Start Time  1525    SLP Stop Time   1603    SLP Time Calculation (min)  38 min    Activity Tolerance  Patient tolerated treatment well       History reviewed. No pertinent past medical history.  History reviewed. No pertinent surgical history.  There were no vitals filed for this visit.  Subjective Assessment - 10/28/18 1821    Subjective  "I was cutting down some tree."    Patient is accompained by:  Family member    Currently in Pain?  No/denies         ADULT SLP TREATMENT - 10/28/18 0001      General Information   Behavior/Cognition  Alert;Cooperative;Pleasant mood    Patient Positioning  Upright in chair    Oral care provided  N/A    HPI  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 He received outpatient SLP therapy in [place] from 09/2017 to 02/2018 when he was discharged due to Pt wanting to take a break from therapy (long drive for family). He had made excellent gains during treatment. He elected to resume SLP therapy closer to his home in [place] over the summer, but felt like it was not a good fit for his current needs. He is here today accompanied by his wife, [name].      Treatment Provided   Treatment provided  Cognitive-Linquistic      Pain Assessment   Pain Assessment  No/denies pain      Cognitive-Linquistic Treatment   Treatment focused on  Aphasia;Apraxia;Patient/family/caregiver education     Skilled Treatment  SLP provided skilled treatment targeting expressive language goals during structured task involving Pt silently reading a word and describing the word without naming to his wife. Pt required max cues as he remained silent and could not figure out where/how to start. SLP cued Pt by providing "wh" questions, however he continued to need max support. The task was then modified to having the Pt gesture applicable words in the hopes of stimulating verbal response. Pt initially required mod/max cues fading to mi/mod cues.       Assessment / Recommendations / Plan   Plan  Continue with current plan of care      Progression Toward Goals   Progression toward goals  Progressing toward goals         SLP Short Term Goals - 10/28/18 1824      SLP SHORT TERM GOAL #1   Title  Pt will increase naming of low frequency objects/pictures to 80% acc when provided with min/mod multimodality cues.   10/7 change to mi/mod cues   Baseline  70%    Time  4    Period  Weeks    Status  On-going      SLP SHORT TERM GOAL #2   Title  Pt will implement word-finding strategies  with 90% accuracy when unable to verbalize desired word in conversation/functional tasks with mi/mod assist.    Baseline  Few attempts made to use strategies when blocked    Time  4    Period  Weeks    Status  On-going      SLP SHORT TERM GOAL #3   Title  Pt will describe objects and pictures by providing at least three salient features as judged by clinician with 90% acc when provided mi/mod cues.    Baseline  mod/max cues    Time  4    Period  Weeks    Status  On-going      SLP SHORT TERM GOAL #4   Title  Pt will increase MLU to 8+ words when describing pictures with use of scripted cues and carrier phrases as needed on 8/10 trials.   10/7 change to 8+   Baseline  4+    Time  4    Period  Weeks    Status  On-going      SLP SHORT TERM GOAL #5   Title  Pt will increase reading comprehension of short sentences  with single word sentence completion and word bank provided to 90% acc with min assist.   change to 90% min assist    Baseline  50%    Time  4    Period  Weeks    Status  On-going      SLP SHORT TERM GOAL #6   Title  Pt will write single words with 75% acc when looking at pictures, discussing family or high interest topics with mod assist from SLP    Baseline  40-60%    Time  4    Period  Weeks    Status  On-going       SLP Long Term Goals - 10/28/18 1824      SLP LONG TERM GOAL #1   Title  Pt will increase reading comprehension for sentences to [facility] with use of strategies and min assist.     Baseline  mod assist    Time  8    Period  Weeks    Status  On-going      SLP LONG TERM GOAL #2   Title  Pt will be able to participate in a 5-minute conversation about topic of his choosing (personal interest) with use of written SLP cues for support and min assist.     Baseline  mod assist    Time  8    Period  Weeks    Status  On-going      SLP LONG TERM GOAL #3   Title  Pt will be able to write high frequency words with 90% acc with use of word bank/template as needed and indirect cues.     Baseline  50% and does not have template to choose from    Time  8    Period  Weeks    Status  On-going       Plan - 10/28/18 1824    Clinical Impression Statement   Pt accompanied to therapy by his wife. Moderate expressive aphasia perists. Pt had significant difficulty completing naming to description task this date. SLP will modify for next session. Continue targeting short term goals next session.     Speech Therapy Frequency  2x / week    Duration  4 weeks   8 weeks   Treatment/Interventions  Compensatory strategies;Cueing hierarchy;Functional tasks;Patient/family education;Multimodal communcation approach;Compensatory techniques;Internal/external aids;SLP instruction  and feedback;Language facilitation    Potential to Achieve Goals  Good    Potential Considerations  --   time post  stroke   SLP Home Exercise Plan  Pt will complete HEP as assigned to facilitate carryover of treatment strategies and techniques in home environment with written cues provided.    Consulted and Agree with Plan of Care  Patient       Patient will benefit from skilled therapeutic intervention in order to improve the following deficits and impairments:   Aphasia  Apraxia    Problem List There are no active problems to display for this patient.  Thank you,  Havery Moros, CCC-SLP [phone]  [facility] 10/28/2018, 6:25 PM  [facility] [address] [place], [place], [address] Phone: [phone]   Fax:  [phone]   Name: [name] MRN: [id number] Date of Birth: 07-17-1951

## 2018-10-30 ENCOUNTER — Encounter (HOSPITAL_COMMUNITY): Payer: Self-pay | Admitting: Speech Pathology

## 2018-10-30 ENCOUNTER — Ambulatory Visit (HOSPITAL_COMMUNITY): Payer: Medicare Other | Admitting: Speech Pathology

## 2018-10-30 DIAGNOSIS — R4701 Aphasia: Secondary | ICD-10-CM | POA: Diagnosis not present

## 2018-10-30 DIAGNOSIS — R482 Apraxia: Secondary | ICD-10-CM

## 2018-10-30 NOTE — Therapy (Signed)
[facility] [address] [place], [place], [address] Phone: [phone]   Fax:  [phone]  Speech Language Pathology Treatment  Patient Details  Name: [name] MRN: [id number] Date of Birth: 10/15/1951 Referring Provider (SLP): Jay Schlichter, MD   Encounter Date: 10/30/2018  End of Session - 10/30/18 1816    Visit Number  16    Number of Visits  25    Date for SLP Re-Evaluation  11/27/18    Authorization Type  Medicare     SLP Start Time  1520    SLP Stop Time   1607    SLP Time Calculation (min)  47 min    Activity Tolerance  Patient tolerated treatment well       History reviewed. No pertinent past medical history.  History reviewed. No pertinent surgical history.  There were no vitals filed for this visit.  Subjective Assessment - 10/30/18 1809    Subjective  "They said I was doing better."     Patient is accompained by:  Family member    Currently in Pain?  No/denies       ADULT SLP TREATMENT - 10/30/18 0001      General Information   Behavior/Cognition  Alert;Cooperative;Pleasant mood    Patient Positioning  Upright in chair    Oral care provided  N/A    HPI  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 He received outpatient SLP therapy in [place] from 09/2017 to 02/2018 when he was discharged due to Pt wanting to take a break from therapy (long drive for family). He had made excellent gains during treatment. He elected to resume SLP therapy closer to his home in [place] over the summer, but felt like it was not a good fit for his current needs. He is here today accompanied by his wife, [name].      Treatment Provided   Treatment provided  Cognitive-Linquistic      Pain Assessment   Pain Assessment  No/denies pain      Cognitive-Linquistic Treatment   Treatment focused on  Aphasia;Apraxia;Patient/family/caregiver education     Skilled Treatment  SLP provided skilled treatment targeting expressive language goals during structured task involving naming a given item's function and/or words associated with the word. This was targeted this date due to difficulty noted during the previous session. SLP modified task by providing written cues, which helped Pt. SLP then reversed the task and provided the same description and requested that Pt name the targeted word which he did with 90% acc when provided min cues. SLP provided education and rationale for also having Pt pair verbal attempts with gestures when he is unable to verbalize his thoughts. He was given additional HEP.      Assessment / Recommendations / Plan   Plan  Continue with current plan of care      Progression Toward Goals   Progression toward goals  Progressing toward goals         SLP Short Term Goals - 10/30/18 1821      SLP SHORT TERM GOAL #1   Title  Pt will increase naming of low frequency objects/pictures to 80% acc when provided with min/mod multimodality cues.   10/7 change to mi/mod cues   Baseline  70%    Time  4    Period  Weeks    Status  On-going  SLP SHORT TERM GOAL #2   Title  Pt will implement word-finding strategies with 90% accuracy when unable to verbalize desired word in conversation/functional tasks with mi/mod assist.    Baseline  Few attempts made to use strategies when blocked    Time  4    Period  Weeks    Status  On-going      SLP SHORT TERM GOAL #3   Title  Pt will describe objects and pictures by providing at least three salient features as judged by clinician with 90% acc when provided mi/mod cues.    Baseline  mod/max cues    Time  4    Period  Weeks    Status  On-going      SLP SHORT TERM GOAL #4   Title  Pt will increase MLU to 8+ words when describing pictures with use of scripted cues and carrier phrases as needed on 8/10 trials.   10/7 change to 8+   Baseline  4+    Time  4    Period  Weeks    Status   On-going      SLP SHORT TERM GOAL #5   Title  Pt will increase reading comprehension of short sentences with single word sentence completion and word bank provided to 90% acc with min assist.   change to 90% min assist    Baseline  50%    Time  4    Period  Weeks    Status  On-going      SLP SHORT TERM GOAL #6   Title  Pt will write single words with 75% acc when looking at pictures, discussing family or high interest topics with mod assist from SLP    Baseline  40-60%    Time  4    Period  Weeks    Status  On-going       SLP Long Term Goals - 10/30/18 1821      SLP LONG TERM GOAL #1   Title  Pt will increase reading comprehension for sentences to [facility] with use of strategies and min assist.     Baseline  mod assist    Time  8    Period  Weeks    Status  On-going      SLP LONG TERM GOAL #2   Title  Pt will be able to participate in a 5-minute conversation about topic of his choosing (personal interest) with use of written SLP cues for support and min assist.     Baseline  mod assist    Time  8    Period  Weeks    Status  On-going      SLP LONG TERM GOAL #3   Title  Pt will be able to write high frequency words with 90% acc with use of word bank/template as needed and indirect cues.     Baseline  50% and does not have template to choose from    Time  8    Period  Weeks    Status  On-going       Plan - 10/30/18 1817    Clinical Impression Statement  Pt continues to be highly motivated and works hard each treatment session. He visited with some friends that he hadn't seen in over a month and he was given positive feedback about his speech improvement. He continues to need reminders to communicate with total communication strategies due to aphasia and apraxia. Next session, continue targeting word description tasks  and oral reading. [name] also expressed a desire to have him practice reading numbers.    Speech Therapy Frequency  2x / week    Duration  4 weeks   8 weeks    Treatment/Interventions  Compensatory strategies;Cueing hierarchy;Functional tasks;Patient/family education;Multimodal communcation approach;Compensatory techniques;Internal/external aids;SLP instruction and feedback;Language facilitation    Potential to Achieve Goals  Good    Potential Considerations  --   time post stroke   SLP Home Exercise Plan  Pt will complete HEP as assigned to facilitate carryover of treatment strategies and techniques in home environment with written cues provided.    Consulted and Agree with Plan of Care  Patient       Patient will benefit from skilled therapeutic intervention in order to improve the following deficits and impairments:   Aphasia  Apraxia    Problem List There are no active problems to display for this patient.  Thank you,  Havery Moros, CCC-SLP [phone]  [facility] 10/30/2018, 6:22 PM   [facility] [address] [place], [place], [address] Phone: [phone]   Fax:  [phone]   Name: [name] MRN: [id number] Date of Birth: 1951-01-09

## 2018-11-03 ENCOUNTER — Ambulatory Visit (HOSPITAL_COMMUNITY): Payer: Medicare Other | Admitting: Speech Pathology

## 2018-11-04 ENCOUNTER — Ambulatory Visit (HOSPITAL_COMMUNITY): Payer: Medicare Other | Admitting: Speech Pathology

## 2018-11-04 ENCOUNTER — Encounter (HOSPITAL_COMMUNITY): Payer: Self-pay | Admitting: Speech Pathology

## 2018-11-04 DIAGNOSIS — R482 Apraxia: Secondary | ICD-10-CM

## 2018-11-04 DIAGNOSIS — R4701 Aphasia: Secondary | ICD-10-CM

## 2018-11-04 NOTE — Therapy (Signed)
[facility] [address] [place], [place], [address] Phone: [phone]   Fax:  [phone]  Speech Language Pathology Treatment  Patient Details  Name: [name] MRN: [id number] Date of Birth: 1951-10-03 Referring Provider (SLP): Jay Schlichter, MD   Encounter Date: 11/04/2018  End of Session - 11/04/18 1631    Visit Number  17    Number of Visits  25    Date for SLP Re-Evaluation  11/27/18    Authorization Type  Medicare     SLP Start Time  1515    SLP Stop Time   1600    SLP Time Calculation (min)  45 min    Activity Tolerance  Patient tolerated treatment well       History reviewed. No pertinent past medical history.  History reviewed. No pertinent surgical history.  There were no vitals filed for this visit.  Subjective Assessment - 11/04/18 1612    Subjective  "This was hard."    Patient is accompained by:  Family member    Currently in Pain?  No/denies        ADULT SLP TREATMENT - 11/04/18 1612      General Information   Behavior/Cognition  Alert;Cooperative;Pleasant mood    Patient Positioning  Upright in chair    Oral care provided  N/A    HPI  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 He received outpatient SLP therapy in [place] from 09/2017 to 02/2018 when he was discharged due to Pt wanting to take a break from therapy (long drive for family). He had made excellent gains during treatment. He elected to resume SLP therapy closer to his home in [place] over the summer, but felt like it was not a good fit for his current needs. He is here today accompanied by his wife, [name].      Treatment Provided   Treatment provided  Cognitive-Linquistic      Pain Assessment   Pain Assessment  No/denies pain      Cognitive-Linquistic Treatment   Treatment focused on  Aphasia;Apraxia;Patient/family/caregiver education    Skilled  Treatment  SLP provided skilled treatment targeting expressive and receptive language goals during structured tasks involving naming an item to description with visual/picture cue provided, sorting pictured objects into specific categories (animals, color, transportation), and adding an item to a given category. SLP provided picture cues and mild/mod verbal cues to complete tasks. He benefited from picture cues to separate items into categories and completed with 95% acc. Hiw wife wrote a list of family member names down and Mr. Schaffert was able to verbalize his relationship to each with use of written cues.      Assessment / Recommendations / Plan   Plan  Continue with current plan of care      Progression Toward Goals   Progression toward goals  Progressing toward goals       SLP Education - 11/04/18 1630    Education Details  provided visual/picture cues to assist with HEP    Person(s) Educated  Patient;Spouse    Methods  Explanation;Handout    Comprehension  Verbalized understanding       SLP Short Term Goals - 11/04/18 1636      SLP SHORT TERM GOAL #1   Title  Pt will increase naming of low frequency objects/pictures to 80% acc when provided with min/mod multimodality cues.   10/7 change  to mi/mod cues   Baseline  70%    Time  4    Period  Weeks    Status  On-going      SLP SHORT TERM GOAL #2   Title  Pt will implement word-finding strategies with 90% accuracy when unable to verbalize desired word in conversation/functional tasks with mi/mod assist.    Baseline  Few attempts made to use strategies when blocked    Time  4    Period  Weeks    Status  On-going      SLP SHORT TERM GOAL #3   Title  Pt will describe objects and pictures by providing at least three salient features as judged by clinician with 90% acc when provided mi/mod cues.    Baseline  mod/max cues    Time  4    Period  Weeks    Status  On-going      SLP SHORT TERM GOAL #4   Title  Pt will increase MLU to  8+ words when describing pictures with use of scripted cues and carrier phrases as needed on 8/10 trials.   10/7 change to 8+   Baseline  4+    Time  4    Period  Weeks    Status  On-going      SLP SHORT TERM GOAL #5   Title  Pt will increase reading comprehension of short sentences with single word sentence completion and word bank provided to 90% acc with min assist.   change to 90% min assist    Baseline  50%    Time  4    Period  Weeks    Status  On-going      SLP SHORT TERM GOAL #6   Title  Pt will write single words with 75% acc when looking at pictures, discussing family or high interest topics with mod assist from SLP    Baseline  40-60%    Time  4    Period  Weeks    Status  On-going       SLP Long Term Goals - 11/04/18 1636      SLP LONG TERM GOAL #1   Title  Pt will [facility] reading comprehension [facility] to [facility] with use of strategies and min assist.     Baseline  mod assist    Time  8    Period  Weeks    Status  On-going      SLP LONG TERM GOAL #2   Title  Pt will be able to participate in a 5-minute conversation about topic of his choosing (personal interest) with use of written SLP cues for support and min assist.     Baseline  mod assist    Time  8    Period  Weeks    Status  On-going      SLP LONG TERM GOAL #3   Title  Pt will be able to write high frequency words with 90% acc with use of word bank/template as needed and indirect cues.     Baseline  50% and does not have template to choose from    Time  8    Period  Weeks    Status  On-going       Plan - 11/04/18 1631    Clinical Impression Statement  Pt had greater difficulty completing expressive language tasks today. A close friend passed away this morning and he had been visiting with him at the hospital. SLP  discussed the impact of emotional stress on language expression. He continues to be motivated during therapy. Next session, plan to have patient write numbers to dictation. Moderate  expressive aphasia persists.    Speech Therapy Frequency  2x / week    Duration  4 weeks   8 weeks   Treatment/Interventions  Compensatory strategies;Cueing hierarchy;Functional tasks;Patient/family education;Multimodal communcation approach;Compensatory techniques;Internal/external aids;SLP instruction and feedback;Language facilitation    Potential to Achieve Goals  Good    Potential Considerations  --   time post stroke   SLP Home Exercise Plan  Pt will complete HEP as assigned to facilitate carryover of treatment strategies and techniques in home environment with written cues provided.    Consulted and Agree with Plan of Care  Patient       Patient will benefit from skilled therapeutic intervention in order to improve the following deficits and impairments:   Aphasia  Apraxia    Problem List There are no active problems to display for this patient.  Thank you,  Havery Moros, CCC-SLP [phone]  [facility] 11/04/2018, 4:36 PM  [facility] [address] [place], [place], [address] Phone: [phone]   Fax:  [phone]   Name: Wayden Schwertner MRN: [id number] Date of Birth: [birth year]

## 2018-11-06 ENCOUNTER — Ambulatory Visit (HOSPITAL_COMMUNITY): Payer: Medicare Other | Admitting: Speech Pathology

## 2018-11-06 ENCOUNTER — Encounter (HOSPITAL_COMMUNITY): Payer: Self-pay | Admitting: Speech Pathology

## 2018-11-06 DIAGNOSIS — R482 Apraxia: Secondary | ICD-10-CM

## 2018-11-06 DIAGNOSIS — R4701 Aphasia: Secondary | ICD-10-CM

## 2018-11-06 NOTE — Therapy (Signed)
[facility] [address] [place], [place], [address] Phone: [phone]   Fax:  [phone]  Speech Language Pathology Treatment  Patient Details  Name: [name] MRN: [id number] Date of Birth: 06/01/1951 Referring Provider (SLP): Jay SchlichterVani Chilukuri, MD   Encounter Date: 11/06/2018  End of Session - 11/06/18 1624    Visit Number  18    Number of Visits  25    Date for SLP Re-Evaluation  11/27/18    Authorization Type  Medicare     SLP Start Time  1520    SLP Stop Time   1605    SLP Time Calculation (min)  45 min    Activity Tolerance  Patient tolerated treatment well       History reviewed. No pertinent past medical history.  History reviewed. No pertinent surgical history.  There were no vitals filed for this visit.  Subjective Assessment - 11/06/18 1616    Subjective  "I just forgot everything."    Patient is accompained by:  Family member    Currently in Pain?  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 [place] 09/[place] to 02/2018 when he was discharged due to Pt wanting to take a break from therapy (long drive for family). He had made excellent gains during treatment. He elected to resume S[place] therapy [place] the [place], but felt like it was not a good fit for his current needs. He is here today accompanied by his wife, [name].      Treatment Provided   Treatment provided  Cognitive-Linquistic      Pain Assessment   Pain Assessment  No/denies pain      Cognitive-Linquistic Treatment   Treatment focused on  Aphasia;Apraxia;Patient/family/caregiver  education    Skilled Treatment  SLP provided skilled treatment targeting expressive and receptive language goals during structured and unstructured tasks involving Pt led conversation regarding activities at home and writing measurements. SLP also provided education regarding factors which may negatively influence espressive language skills (sleep, stress) as his wife expressed concern over fluctuation in verbal output. He was able to measure rectangles with a ruler and write appropriate number without cues. When verbalizing the measurements, he self corrected appropriately.       Assessment / Recommendations / Plan   Plan  Continue with current plan of care      Progression Toward Goals   Progression toward goals  Progressing toward goals       SLP Education - 11/06/18 1623    Education Details  Provided communication book to trial at home    Person(s) Educated  Patient;Spouse    Methods  Explanation;Handout    Comprehension  Verbalized understanding       SLP Short Term Goals - 11/06/18 1628      SLP SHORT TERM GOAL #1   Title  Pt will increase naming of low frequency objects/pictures to 80% acc when provided with min/mod multimodality cues.   10/7 change to mi/mod cues   Baseline  70%    Time  4    Period  Weeks    Status  On-going      SLP SHORT TERM GOAL #2   Title  Pt will implement word-finding strategies with 90% accuracy when unable to verbalize desired word in conversation/functional tasks with mi/mod assist.    Baseline  Few attempts made to use strategies when blocked    Time  4    Period  Weeks    Status  On-going      SLP SHORT TERM GOAL #3   Title  Pt will describe objects and pictures by providing at least three salient features as judged by clinician with 90% acc when provided mi/mod cues.    Baseline  mod/max cues    Time  4    Period  Weeks    Status  On-going      SLP SHORT TERM GOAL #4   Title  Pt will increase MLU to 8+ words when describing pictures  with use of scripted cues and carrier phrases as needed on 8/10 trials.   10/7 change to 8+   Baseline  4+    Time  4    Period  Weeks    Status  On-going      SLP SHORT TERM GOAL #5   Title  Pt will increase reading comprehension of short sentences with single word sentence completion and word bank provided to 90% acc with min assist.   change to 90% min assist    Baseline  50%    Time  4    Period  Weeks    Status  On-going      SLP SHORT TERM GOAL #6   Title  Pt will write single words with 75% acc when looking at pictures, discussing family or high interest topics with mod assist from SLP    Baseline  40-60%    Time  4    Period  Weeks    Status  On-going       SLP Long Term Goals - 11/06/18 1629      SLP LONG TERM GOAL #1   Title  Pt will increase reading comprehension for sentences to [facility] with use of strategies and min assist.     Baseline  mod assist    Time  8    Period  Weeks    Status  On-going      SLP LONG TERM GOAL #2   Title  Pt will be able to participate in a 5-minute conversation about topic of his choosing (personal interest) with use of written SLP cues for support and min assist.     Baseline  mod assist    Time  8    Period  Weeks    Status  On-going      SLP LONG TERM GOAL #3   Title  Pt will be able to write high frequency words with 90% acc with use of word bank/template as needed and indirect cues.     Baseline  50% and does not have template to choose from    Time  8    Period  Weeks    Status  On-going       Plan - 11/06/18 1625    Clinical Impression Statement  Pt continues to benefit from written cues to supplement conversations. SLP also continues to encourage Pt and spouse to have Pt prepare conversations with his wife before he goes on errands or phone calls with other people. His wife stated  that he does not initiate this and tends to just go ahead and try to do on his own. Definite improvements noted in spontaneous speech and  conversations, however he continues to have difficulty with targeted word finding tasks. He was given a small communication book to try out at home and return next session. Continue targeting goals and discuss possible discharge from therapy over the next couple of weeks.    Speech Therapy Frequency  2x / week    Duration  4 weeks   8 weeks   Treatment/Interventions  Compensatory strategies;Cueing hierarchy;Functional tasks;Patient/family education;Multimodal communcation approach;Compensatory techniques;Internal/external aids;SLP instruction and feedback;Language facilitation    Potential to Achieve Goals  Good    Potential Considerations  --   time post stroke   SLP Home Exercise Plan  Pt will complete HEP as assigned to facilitate carryover of treatment strategies and techniques in home environment with written cues provided.    Consulted and Agree with Plan of Care  Patient       Patient will benefit from skilled therapeutic intervention in order to improve the following deficits and impairments:   Aphasia  Apraxia    Problem List There are no active problems to display for this patient.  Thank you,  Havery Moros, CCC-SLP [phone]  [facility] 11/06/2018, 4:29 PM  [facility] [address] [place], [place], [address] Phone: [phone]   Fax:  [phone]   Name: Jerauld Bostwick MRN: [id number] Date of Birth: 1951-08-26

## 2018-11-10 ENCOUNTER — Encounter (HOSPITAL_COMMUNITY): Payer: Self-pay | Admitting: Speech Pathology

## 2018-11-10 ENCOUNTER — Ambulatory Visit (HOSPITAL_COMMUNITY): Payer: Medicare Other | Admitting: Speech Pathology

## 2018-11-10 DIAGNOSIS — R4701 Aphasia: Secondary | ICD-10-CM | POA: Diagnosis not present

## 2018-11-10 DIAGNOSIS — R482 Apraxia: Secondary | ICD-10-CM

## 2018-11-10 NOTE — Therapy (Signed)
[facility] [address] [place], [place], [address] Phone: [phone]   Fax:  [phone]  Speech Language Pathology Treatment  Patient Details  Name: [name] MRN: [id number] Date of Birth: [birth year] Referring Provider (SLP): Jay Schlichter, MD   Encounter Date: 11/10/2018  End of Session - 11/10/18 1640    Visit Number  19    Number of Visits  25    Date for SLP Re-Evaluation  11/27/18    Authorization Type  Medicare     SLP Start Time  1515    SLP Stop Time   1615    SLP Time Calculation (min)  60 min    Activity Tolerance  Patient tolerated treatment well       History reviewed. No pertinent past medical history.  History reviewed. No pertinent surgical history.  There were no vitals filed for this visit.  Subjective Assessment - 11/10/18 1633    Subjective  "I had a hard time with the checks."    Currently in Pain?  No/denies       ADULT SLP TREATMENT - 11/10/18 1634      General Information   Behavior/Cognition  Alert;Cooperative;Pleasant mood    Patient Positioning  Upright in chair    Oral care provided  N/A    HPI  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 He received outpatient SLP therapy in [place] from 09/2017 to 02/2018 when he was discharged due to Pt wanting to take a break from therapy (long drive for family). He had made excellent gains during treatment. He elected to resume SLP therapy closer to his home in [place] over the summer, but felt like it was not a good fit for his current needs. He is here today accompanied by his wife, [name].      Treatment Provided   Treatment provided  Cognitive-Linquistic      Pain Assessment   Pain Assessment  No/denies pain      Cognitive-Linquistic Treatment   Treatment focused on  Aphasia;Apraxia;Patient/family/caregiver education    Skilled Treatment  SLP provided skilled  treatment targeting expressive and receptive language goals during check writing and bill paying task this date. [name] reported that Pt struggled to complete his checks at home this morning due to difficulty writing numbers in expanded form. SLP assisted by helping Pt make a template for him to refer to when writing checks. He utilized the template with moderate cues and was given additional work to complete at home. SLP also provided aphasia therapy in client centered conversation over ten minutes discussing projects at home. His wife attempted to interject during longer pauses and when Pt visibly struggling to supply correct words. SLP provided gestures and verbal cues for Pt to attempt with min assist from SLP.       Assessment / Recommendations / Plan   Plan  Continue with current plan of care      Progression Toward Goals   Progression toward goals  Progressing toward goals       SLP Education - 11/10/18 1640    Education Details  Provided check writing template to use at home    Person(s) Educated  Patient    Methods  Explanation;Handout    Comprehension  Verbalized understanding;Need further instruction       SLP Short Term Goals - 11/10/18 1645      SLP SHORT TERM  GOAL #1   Title  Pt will increase naming of low frequency objects/pictures to 80% acc when provided with min/mod multimodality cues.   10/7 change to mi/mod cues   Baseline  70%    Time  4    Period  Weeks    Status  On-going      SLP SHORT TERM GOAL #2   Title  Pt will implement word-finding strategies with 90% accuracy when unable to verbalize desired word in conversation/functional tasks with mi/mod assist.    Baseline  Few attempts made to use strategies when blocked    Time  4    Period  Weeks    Status  On-going      SLP SHORT TERM GOAL #3   Title  Pt will describe objects and pictures by providing at least three salient features as judged by clinician with 90% acc when provided mi/mod cues.     Baseline  mod/max cues    Time  4    Period  Weeks    Status  On-going      SLP SHORT TERM GOAL #4   Title  Pt will increase MLU to 8+ words when describing pictures with use of scripted cues and carrier phrases as needed on 8/10 trials.   10/7 change to 8+   Baseline  4+    Time  4    Period  Weeks    Status  On-going      SLP SHORT TERM GOAL #5   Title  Pt will increase reading comprehension of short sentences with single word sentence completion and word bank provided to 90% acc with min assist.   change to 90% min assist    Baseline  50%    Time  4    Period  Weeks    Status  On-going      SLP SHORT TERM GOAL #6   Title  Pt will write single words with 75% acc when looking at pictures, discussing family or high interest topics with mod assist from SLP    Baseline  40-60%    Time  4    Period  Weeks    Status  On-going       SLP Long Term Goals - 11/10/18 1645      SLP LONG TERM GOAL #1   Title  Pt will increase reading comprehension for sentences to [facility] with use of strategies and min assist.     Baseline  mod assist    Time  8    Period  Weeks    Status  On-going      SLP LONG TERM GOAL #2   Title  Pt will be able to participate in a 5-minute conversation about topic of his choosing (personal interest) with use of written SLP cues for support and min assist.     Baseline  mod assist    Time  8    Period  Weeks    Status  On-going      SLP LONG TERM GOAL #3   Title  Pt will be able to write high frequency words with 90% acc with use of word bank/template as needed and indirect cues.     Baseline  50% and does not have template to choose from    Time  8    Period  Weeks    Status  On-going       Plan - 11/10/18 1640    Clinical Impression Statement  Pt expressed frustration over his fluctuating communication abilities. He acknowledged that he attended two funerals over the past few days, which may have impacted his speech. Spouse reported difficulty  completing a homework assignment which required Pt to cross out items which did "not" belong to a category. When SLP reviewed with Pt, it was clear that the Pt was able to identify which items did and did not belong, but he had trouble completing it according to the directions. SLP provided reassurance that he was completing the more challenging part of the task. Noticeable improvement noted in his ability to participate in dialogue with SLP today. He still has some specific items he would like to work on, so we will likely continue therapy for another few weeks. Next session, continue to practice implementation of check writing template.     Speech Therapy Frequency  2x / week    Duration  4 weeks   8 weeks   Treatment/Interventions  Compensatory strategies;Cueing hierarchy;Functional tasks;Patient/family education;Multimodal communcation approach;Compensatory techniques;Internal/external aids;SLP instruction and feedback;Language facilitation    Potential to Achieve Goals  Good    Potential Considerations  --   time post stroke   SLP Home Exercise Plan  Pt will complete HEP as assigned to facilitate carryover of treatment strategies and techniques in home environment with written cues provided.    Consulted and Agree with Plan of Care  Patient       Patient will benefit from skilled therapeutic intervention in order to improve the following deficits and impairments:   Aphasia  Apraxia    Problem List There are no active problems to display for this patient.  Thank you,  Havery Moros, CCC-SLP [phone]  [facility] 11/10/2018, 4:46 PM  [facility] [address] [place], [place], [address] Phone: [phone]   Fax:  [phone]   Name: [name] MRN: [id number] Date of Birth: 13-Feb-1951

## 2018-11-12 ENCOUNTER — Encounter (HOSPITAL_COMMUNITY): Payer: PRIVATE HEALTH INSURANCE | Admitting: Speech Pathology

## 2018-11-13 ENCOUNTER — Encounter (HOSPITAL_COMMUNITY): Payer: PRIVATE HEALTH INSURANCE | Admitting: Speech Pathology

## 2018-11-17 ENCOUNTER — Ambulatory Visit (HOSPITAL_COMMUNITY): Payer: Medicare Other | Admitting: Speech Pathology

## 2018-11-17 ENCOUNTER — Encounter (HOSPITAL_COMMUNITY): Payer: Self-pay | Admitting: Speech Pathology

## 2018-11-17 DIAGNOSIS — R482 Apraxia: Secondary | ICD-10-CM

## 2018-11-17 DIAGNOSIS — R4701 Aphasia: Secondary | ICD-10-CM

## 2018-11-17 NOTE — Therapy (Signed)
[facility] [address] [place], [place], [address] Phone: [phone]   Fax:  [phone]  Speech Language Pathology Treatment  Patient Details  Name: [name] MRN: [id number] Date of Birth: [birth year] Referring Provider (SLP): Matthew Schlichter, MD   Encounter Date: 11/17/2018  End of Session - 11/17/18 1609    Visit Number  20    Number of Visits  25    Date for SLP Re-Evaluation  11/27/18    Authorization Type  Medicare     SLP Start Time  1515    SLP Stop Time   1600    SLP Time Calculation (min)  45 min    Activity Tolerance  Patient tolerated treatment well       History reviewed. No pertinent past medical history.  History reviewed. No pertinent surgical history.  There were no vitals filed for this visit.   ADULT SLP TREATMENT - 11/17/18 1609      General Information   Behavior/Cognition  Alert;Cooperative;Pleasant mood    Patient Positioning  Upright in chair    Oral care provided  N/A    HPI  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 He received outpatient SLP therapy in [place] from 09/2017 to 02/2018 when he was discharged due to Pt wanting to take a break from therapy (long drive for family). He had made excellent gains during treatment. He elected to resume SLP therapy closer to his home in [place] over the summer, but felt like it was not a good fit for his current needs. He is here today accompanied by his wife, [name].      Treatment Provided   Treatment provided  Cognitive-Linquistic      Pain Assessment   Pain Assessment  No/denies pain      Cognitive-Linquistic Treatment   Treatment focused on  Aphasia;Apraxia;Patient/family/caregiver education    Skilled Treatment  SLP provided skilled treatment targeting expressive and receptive language goals during client centered conversation over 15 minutes discussing his recent  trip to the beach. SLP did not use written cues this time and Pt adjusted well with SLP providing min verbal cues for word finding. SLP reinforced that Pt and spouse should rehearse verbal script in preparation for solo shopping excursions. [name] acknowledged that he will spend an excessive amount of time looking for items in the hardware store because he does not ask for help from staff. SLP role played a scenario where he had to provide his name and phone number to a sales associate. He was unable to accurately verbalize or write his phone number, but then remembered to use the cue card in his wallet.      Assessment / Recommendations / Plan   Plan  Continue with current plan of care      Progression Toward Goals   Progression toward goals  Progressing toward goals         SLP Short Term Goals - 11/17/18 1612      SLP SHORT TERM GOAL #1   Title  Pt will increase naming of low frequency objects/pictures to 80% acc when provided with min/mod multimodality cues.   10/7 change to mi/mod cues   Baseline  70%    Time  4    Period  Weeks    Status  On-going      SLP SHORT TERM GOAL #2   Title  Pt  will implement word-finding strategies with 90% accuracy when unable to verbalize desired word in conversation/functional tasks with mi/mod assist.    Baseline  Few attempts made to use strategies when blocked    Time  4    Period  Weeks    Status  On-going      SLP SHORT TERM GOAL #3   Title  Pt will describe objects and pictures by providing at least three salient features as judged by clinician with 90% acc when provided mi/mod cues.    Baseline  mod/max cues    Time  4    Period  Weeks    Status  On-going      SLP SHORT TERM GOAL #4   Title  Pt will increase MLU to 8+ words when describing pictures with use of scripted cues and carrier phrases as needed on 8/10 trials.   10/7 change to 8+   Baseline  4+    Time  4    Period  Weeks    Status  On-going      SLP SHORT TERM GOAL  #5   Title  Pt will increase reading comprehension of short sentences with single word sentence completion and word bank provided to 90% acc with min assist.   change to 90% min assist    Baseline  50%    Time  4    Period  Weeks    Status  On-going      SLP SHORT TERM GOAL #6   Title  Pt will write single words with 75% acc when looking at pictures, discussing family or high interest topics with mod assist from SLP    Baseline  40-60%    Time  4    Period  Weeks    Status  On-going       SLP Long Term Goals - 11/17/18 1612      SLP LONG TERM GOAL #1   Title  Pt will increase reading comprehension for sentences to [facility] with use of strategies and min assist.     Baseline  mod assist    Time  8    Period  Weeks    Status  On-going      SLP LONG TERM GOAL #2   Title  Pt will be able to participate in a 5-minute conversation about topic of his choosing (personal interest) with use of written SLP cues for support and min assist.     Baseline  mod assist    Time  8    Period  Weeks    Status  On-going      SLP LONG TERM GOAL #3   Title  Pt will be able to write high frequency words with 90% acc with use of word bank/template as needed and indirect cues.     Baseline  50% and does not have template to choose from    Time  8    Period  Weeks    Status  On-going       Plan - 11/17/18 1611    Clinical Impression Statement Pt continues to need encouragement to prepare at home with his wife before making phone calls or going on solo outings. SLP has provided a small notebook to carry in his pocket, but he does not use it. Next session, will plan to use verbal scripts and role playing scenarios next session.   Speech Therapy Frequency  2x / week    Duration  4 weeks  8 weeks   Treatment/Interventions  Compensatory strategies;Cueing hierarchy;Functional tasks;Patient/family education;Multimodal communcation approach;Compensatory techniques;Internal/external aids;SLP instruction and  feedback;Language facilitation    Potential to Achieve Goals  Good    Potential Considerations  --   time post stroke   SLP Home Exercise Plan  Pt will complete HEP as assigned to facilitate carryover of treatment strategies and techniques in home environment with written cues provided.    Consulted and Agree with Plan of Care  Patient       Patient will benefit from skilled therapeutic intervention in order to improve the following deficits and impairments:   Aphasia  Apraxia    Problem List There are no active problems to display for this patient.  Thank you,  Havery MorosDabney Porter, CCC-SLP [phone]  [facility]AB[facility] 11/17/2018, 4:13 PM  [facility] [address] [place], [place], [address] Phone: [phone]   Fax:  [phone]   Name: [name] MRN: [id number] Date of Birth: 08/03/1951

## 2018-11-19 ENCOUNTER — Other Ambulatory Visit: Payer: Self-pay

## 2018-11-19 ENCOUNTER — Ambulatory Visit (HOSPITAL_COMMUNITY): Payer: Medicare Other | Admitting: Speech Pathology

## 2018-11-19 ENCOUNTER — Encounter (HOSPITAL_COMMUNITY): Payer: Self-pay | Admitting: Speech Pathology

## 2018-11-19 DIAGNOSIS — R482 Apraxia: Secondary | ICD-10-CM

## 2018-11-19 DIAGNOSIS — R4701 Aphasia: Secondary | ICD-10-CM

## 2018-11-19 NOTE — Therapy (Signed)
[facility] [address] [place], [place], [address] Phone: [phone]   Fax:  [phone]  Speech Language Pathology Treatment  Patient Details  Name: [name] MRN: [id number] Date of Birth: 06/05/1951 Referring Provider (SLP): Jay SchlichterVani Chilukuri, MD   Encounter Date: 11/19/2018  End of Session - 11/19/18 1259    Visit Number  21    Number of Visits  25    Date for SLP Re-Evaluation  11/27/18    Authorization Type  Medicare     SLP Start Time  1030    SLP Stop Time   1115    SLP Time Calculation (min)  45 min    Activity Tolerance  Patient tolerated treatment well       History reviewed. No pertinent past medical history.  History reviewed. No pertinent surgical history.  There were no vitals filed for this visit.  Subjective Assessment - 11/19/18 1240    Subjective  "I tried to write some checks, but I get confused."    Currently in Pain?  No/denies            ADULT SLP TREATMENT - 11/19/18 0001      General Information   Behavior/Cognition  Alert;Cooperative;Pleasant mood    Patient Positioning  Upright in chair    Oral care provided  N/A    HPI  Mr. 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 [place] to 02/2018 when he was discharged due to Pt wanting to take a break from therapy (long drive for family). He had made excellent gains during treatment. He elected to resume [place] therapy [place], but felt like it was not a good fit for his current needs. He is here today accompanied by his wife, [name].      Treatment Provided   Treatment provided  Cognitive-Linquistic      Pain Assessment   Pain Assessment  No/denies pain      Cognitive-Linquistic Treatment   Treatment focused on  Aphasia;Apraxia;Patient/family/caregiver education    Skilled  Treatment  SLP provided skilled treatment targeting expressive and receptive language goals during structured tasks. Pt has a strong desire to be able to fill out his checks and he utilized template created by SLP to write the expanded/written form for amount with min cues. The Pt shared that he feels like he has to start all over when he completes the same task at home. SLP encouraged Pt to practice every day to help reinforce steps for completion. Expressive language goals were further targeted with sentence completion tasks. Pt orally read the phrases with 90% acc when provided min cues and completed phrases with appropriate word with 95% acc with indirect cues.      Assessment / Recommendations / Plan   Plan  Continue with current plan of care      Progression Toward Goals   Progression toward goals  Progressing toward goals         SLP Short Term Goals - 11/19/18 1319      SLP SHORT TERM GOAL #1   Title  Pt will increase naming of low frequency objects/pictures to 80% acc when provided with min/mod multimodality cues.   10/7 change to mi/mod cues   Baseline  70%    Time  4    Period  Weeks  Status  On-going      SLP SHORT TERM GOAL #2   Title  Pt will implement word-finding strategies with 90% accuracy when unable to verbalize desired word in conversation/functional tasks with mi/mod assist.    Baseline  Few attempts made to use strategies when blocked    Time  4    Period  Weeks    Status  On-going      SLP SHORT TERM GOAL #3   Title  Pt will describe objects and pictures by providing at least three salient features as judged by clinician with 90% acc when provided mi/mod cues.    Baseline  mod/max cues    Time  4    Period  Weeks    Status  On-going      SLP SHORT TERM GOAL #4   Title  Pt will increase MLU to 8+ words when describing pictures with use of scripted cues and carrier phrases as needed on 8/10 trials.   10/7 change to 8+   Baseline  4+    Time  4     Period  Weeks    Status  On-going      SLP SHORT TERM GOAL #5   Title  Pt will increase reading comprehension of short sentences with single word sentence completion and word bank provided to 90% acc with min assist.   change to 90% min assist    Baseline  50%    Time  4    Period  Weeks    Status  On-going      SLP SHORT TERM GOAL #6   Title  Pt will write single words with 75% acc when looking at pictures, discussing family or high interest topics with mod assist from SLP    Baseline  40-60%    Time  4    Period  Weeks    Status  On-going       SLP Long Term Goals - 11/19/18 1319      SLP LONG TERM GOAL #1   Title  Pt will increase reading comprehension for sentences to [facility] with use of strategies and min assist.     Baseline  mod assist    Time  8    Period  Weeks    Status  On-going      SLP LONG TERM GOAL #2   Title  Pt will be able to participate in a 5-minute conversation about topic of his choosing (personal interest) with use of written SLP cues for support and min assist.     Baseline  mod assist    Time  8    Period  Weeks    Status  On-going      SLP LONG TERM GOAL #3   Title  Pt will be able to write high frequency words with 90% acc with use of word bank/template as needed and indirect cues.     Baseline  50% and does not have template to choose from    Time  8    Period  Weeks    Status  On-going       Plan - 11/19/18 1304    Clinical Impression Statement  Pt continues to be motivated to improve his expressive language skills. Functionally, he communicates better in client centered conversations than structured word finding activities. He was encouraged to practice the check writing template daily at home. Plan to discuss decreasing SLP sessions to once per week in preparation for discharge.  Goals to be updated next session.     Speech Therapy Frequency  2x / week    Duration  4 weeks   8 weeks   Treatment/Interventions  Compensatory strategies;Cueing  hierarchy;Functional tasks;Patient/family education;Multimodal communcation approach;Compensatory techniques;Internal/external aids;SLP instruction and feedback;Language facilitation    Potential to Achieve Goals  Good    Potential Considerations  --   time post stroke   SLP Home Exercise Plan  Pt will complete HEP as assigned to facilitate carryover of treatment strategies and techniques in home environment with written cues provided.    Consulted and Agree with Plan of Care  Patient       Patient will benefit from skilled therapeutic intervention in order to improve the following deficits and impairments:   Aphasia  Apraxia    Problem List There are no active problems to display for this patient.  Thank you,  Havery Moros, CCC-SLP [phone]  [facility] 11/19/2018, 1:19 PM  [facility] [address] [place], [place], [address] Phone: [phone]   Fax:  [phone]   Name: Judas Mohammad MRN: [id number] Date of Birth: 05-Dec-1951

## 2018-11-25 ENCOUNTER — Encounter (HOSPITAL_COMMUNITY): Payer: Self-pay | Admitting: Speech Pathology

## 2018-11-25 ENCOUNTER — Ambulatory Visit (HOSPITAL_COMMUNITY): Payer: Medicare Other | Attending: Neurology | Admitting: Speech Pathology

## 2018-11-25 DIAGNOSIS — R4701 Aphasia: Secondary | ICD-10-CM

## 2018-11-25 DIAGNOSIS — R482 Apraxia: Secondary | ICD-10-CM | POA: Diagnosis present

## 2018-11-25 NOTE — Therapy (Signed)
[place] [address] [place], [place], [address] Phone: [phone]   Fax:  [phone]  Speech Language Pathology Treatment  Patient Details  Name: [name] MRN: [id number] Date of Birth: December 23, 1951 Referring Provider (SLP): Jay Schlichter, MD   Encounter Date: 11/25/2018  End of Session - 11/25/18 1709    Visit Number  22    Number of Visits  25    Date for SLP Re-Evaluation  11/27/18    Authorization Type  Medicare     SLP Start Time  1520    SLP Stop Time   1612    SLP Time Calculation (min)  52 min    Activity Tolerance  Patient tolerated treatment well       History reviewed. No pertinent past medical history.  History reviewed. No pertinent surgical history.  There were no vitals filed for this visit.  Subjective Assessment - 11/25/18 1529    Subjective  "It just don't do right."    Patient is accompained by:  Family member    Currently in Pain?  No/denies        ADULT SLP TREATMENT - 11/25/18 0001      General Information   Behavior/Cognition  Alert;Cooperative;Pleasant mood    Patient Positioning  Upright in chair    Oral care provided  N/A    HPI  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 He received outpatient SLP therapy in [place] from 09/2017 to 02/2018 when he was discharged due to Pt wanting to take a break from therapy (long drive for family). He had made excellent gains during treatment. He elected to resume SLP therapy closer to his home in [place] over the summer, but felt like it was not a good fit for his current needs. He is here today accompanied by his wife, [name].      Treatment Provided   Treatment provided  Cognitive-Linquistic      Pain Assessment   Pain Assessment  No/denies pain      Cognitive-Linquistic Treatment   Treatment focused on  Aphasia;Apraxia;Patient/family/caregiver education    Skilled Treatment  SLP provided skilled treatment targeting expressive and receptive language goals during client centered conversation. He participated in a conversation regarding his Thanksgiving, Elesa Hacker friends, and working in his garage. Written cues were not utilized this session. He expressed that he was frustrated that he could not remember how to do things in the garage that he previously did. SLP provided encouragement that this was normal after a stroke and that it may take him extra time to "figure" things out. He does not want to have his friends come work with him in the garage until he feels he can do things independently. SLP provided min cues for word finding in conversation (ie. "calendar") and encouraged his wife to refrain from cueing him too early. We discussed plan for discharge and will decrease visits to once per week for the next month with plan for HEP.      Assessment / Recommendations / Plan   Plan  Continue with current plan of care       SLP Education - 11/25/18 1708    Education Details  discussed one more month of therapy after the 5th for 1x/week in preparation for discharge    Person(s) Educated  Patient;Spouse    Methods  Explanation    Comprehension  Verbalized understanding  SLP Short Term Goals - 11/25/18 1710      SLP SHORT TERM GOAL #1   Title  Pt will increase naming of low frequency objects/pictures to 80% acc when provided with min/mod multimodality cues.   10/7 change to mi/mod cues   Baseline  70%    Time  4    Period  Weeks    Status  On-going      SLP SHORT TERM GOAL #2   Title  Pt will implement word-finding strategies with 90% accuracy when unable to verbalize desired word in conversation/functional tasks with mi/mod assist.    Baseline  Few attempts made to use strategies when blocked    Time  4    Period  Weeks    Status  On-going      SLP SHORT TERM GOAL #3   Title  Pt will describe objects and pictures by providing at least  three salient features as judged by clinician with 90% acc when provided mi/mod cues.    Baseline  mod/max cues    Time  4    Period  Weeks    Status  On-going      SLP SHORT TERM GOAL #4   Title  Pt will increase MLU to 8+ words when describing pictures with use of scripted cues and carrier phrases as needed on 8/10 trials.   10/7 change to 8+   Baseline  4+    Time  4    Period  Weeks    Status  On-going      SLP SHORT TERM GOAL #5   Title  Pt will increase reading comprehension of short sentences with single word sentence completion and word bank provided to 90% acc with min assist.   change to 90% min assist    Baseline  50%    Time  4    Period  Weeks    Status  On-going      SLP SHORT TERM GOAL #6   Title  Pt will write single words with 75% acc when looking at pictures, discussing family or high interest topics with mod assist from SLP    Baseline  40-60%    Time  4    Period  Weeks    Status  On-going       SLP Long Term Goals - 11/25/18 1710      SLP LONG TERM GOAL #1   Title  Pt will increase reading comprehension for sentences to [facility] with use of strategies and min assist.     Baseline  mod assist    Time  8    Period  Weeks    Status  On-going      SLP LONG TERM GOAL #2   Title  Pt will be able to participate in a 5-minute conversation about topic of his choosing (personal interest) with use of written SLP cues for support and min assist.     Baseline  mod assist    Time  8    Period  Weeks    Status  On-going      SLP LONG TERM GOAL #3   Title  Pt will be able to write high frequency words with 90% acc with use of word bank/template as needed and indirect cues.     Baseline  50% and does not have template to choose from    Time  8    Period  Weeks    Status  On-going  Plan - 11/25/18 1709    Clinical Impression Statement  Pt continues to be motivated to improve his expressive language skills. Functionally, he communicates better in client  centered conversations than structured word finding activities. He was encouraged to continue practicing the check writing template daily at home in addition to writing important vocabulary in his small notebook.Plan to decrease SLP sessions to once per week in preparation for discharge.      Speech Therapy Frequency  2x / week    Duration  4 weeks   8 weeks   Treatment/Interventions  Compensatory strategies;Cueing hierarchy;Functional tasks;Patient/family education;Multimodal communcation approach;Compensatory techniques;Internal/external aids;SLP instruction and feedback;Language facilitation    Potential to Achieve Goals  Good    Potential Considerations  --   time post stroke   SLP Home Exercise Plan  Pt will complete HEP as assigned to facilitate carryover of treatment strategies and techniques in home environment with written cues provided.    Consulted and Agree with Plan of Care  Patient       Patient will benefit from skilled therapeutic intervention in order to improve the following deficits and impairments:   Aphasia  Apraxia    Problem List There are no active problems to display for this patient.  Thank you,  Havery MorosDabney Adrieana Fennelly, CCC-SLP [phone]  [facility] Center[facility],Emilea Goga 11/25/2018, 5:11 PM  [address] [facility] [address] [place], [place], [address] Phone: [phone]   Fax:  [phone]   Name: Wandalee FerdinandDennis Macapagal MRN: [id number] Date of Birth: 11/11/1951

## 2018-11-27 ENCOUNTER — Encounter (HOSPITAL_COMMUNITY): Payer: Self-pay | Admitting: Speech Pathology

## 2018-11-27 ENCOUNTER — Ambulatory Visit (HOSPITAL_COMMUNITY): Payer: Medicare Other | Admitting: Speech Pathology

## 2018-11-27 DIAGNOSIS — R482 Apraxia: Secondary | ICD-10-CM

## 2018-11-27 DIAGNOSIS — R4701 Aphasia: Secondary | ICD-10-CM | POA: Diagnosis not present

## 2018-11-27 NOTE — Therapy (Signed)
[facility] [address] [place], [place], [address] Phone: [phone]   Fax:  [phone]  Speech Language Pathology Treatment  Patient Details  Name: [name] MRN: [id number] Date of Birth: 08/30/1951 Referring Provider (SLP): Vani Chilukuri, MD   Encounter Date: 11/27/2018    History reviewed. No pertinent past medical history.  History reviewed. No pertinent surgical history.  There were no vitals filed for this visit.         ADULT SLP TREATMENT - 11/27/18 0001      General Information   Behavior/Cognition  Alert;Cooperative;Pleasant mood    Patient Positioning  Upright in chair    Oral care provided  N/A    HPI  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 He received outpatient SLP therapy in [place] from 09/2017 to 02/2018 when he was discharged due to Pt wanting to take a break from therapy (long drive for family). He had made excellent gains during treatment. He elected to resume SLP therapy closer to his home in [place] over the summer, but felt like it was not a good fit for his current needs. He is here today accompanied by his wife, [name].      Treatment Provided   Treatment provided  Cognitive-Linquistic      Pain Assessment   Pain Assessment  No/denies pain      Cognitive-Linquistic Treatment   Treatment focused on  Aphasia;Apraxia;Patient/family/caregiver education    Skilled Treatment SLP provided skilled treatment targeting expressive and receptive language goals during client centered conversation and structured word finding activities. Pt completed naming to description task with 90% acc with rare min cues. During conversation, SLP supplied mi/moderate cues via verbal prompts and queries to expand upon utterances. He was encouraged to utilize his small notebook for assist with word finding and scripts, which he did not  bring today.       Assessment / Recommendations / Plan   Plan  Continue with current plan of care         SLP Short Term Goals - 11/27/18 1638      SLP SHORT TERM GOAL #1   Title  Pt will increase naming of low frequency objects/pictures to 80% acc when provided with min/mod multimodality cues.   10/7 change to mi/mod cues   Baseline  70%    Time  4    Period  Weeks    Status  Achieved      SLP SHORT TERM GOAL #2   Title  Pt will implement word-finding strategies with 90% accuracy when unable to verbalize desired word in conversation/functional tasks with mi/mod assist.   11/27/18 continue and add use of notebook for assist   Baseline  Few attempts made to use strategies when blocked    Time  5    Period  Weeks    Status  Partially Met      SLP SHORT TERM GOAL #3   Title  Pt will describe objects and pictures by providing at least three salient features as judged by clinician with 90% acc when provided mi/mod cues.   11/27/18: continue goal   Baseline  mod/max cues    Time  5    Period  Weeks    Status  Partially Met      SLP SHORT TERM GOAL #4   Title  Pt will increase MLU to 8+ words when describing   pictures with use of scripted cues and carrier phrases as needed on 8/10 trials.   10/7 change to 8+   Baseline  4+    Time  4    Period  Weeks    Status  Achieved      SLP SHORT TERM GOAL #5   Title  Pt will increase reading comprehension of short sentences with single word sentence completion and word bank provided to 90% acc with min assist.   change to 90% min assist    Baseline  50%    Time  4    Period  Weeks    Status  Achieved      Additional Short Term Goals   Additional Short Term Goals  Yes      SLP SHORT TERM GOAL #6   Title  Pt will write single words with 75% acc when looking at pictures, discussing family or high interest topics with mod assist from SLP   11/27/18: D/C goal   Baseline  40-60%    Time  4    Period  Weeks    Status  Partially Met       SLP SHORT TERM GOAL #7   Title  Pt will utilize check writing template to write sample personal checks with 95% acc with cue for error awareness.    Baseline  Template introduced two weeks ago; 80% acc    Time  5    Period  Weeks    Status  New    Target Date  01/01/19       SLP Long Term Goals - 11/27/18 1639      SLP LONG TERM GOAL #1   Title  Pt will increase reading comprehension for sentences to [facility] with use of strategies and min assist.     Baseline  mod assist    Time  8    Period  Weeks    Status  On-going      SLP LONG TERM GOAL #2   Title  Pt will be able to participate in a 5-minute conversation about topic of his choosing (personal interest) with use of written SLP cues for support and min assist.     Baseline  mod assist    Time  8    Period  Weeks    Status  On-going      SLP LONG TERM GOAL #3   Title  Pt will be able to write high frequency words with 90% acc with use of word bank/template as needed and indirect cues.     Baseline  50% and does not have template to choose from    Time  8    Period  Weeks    Status  On-going       Plan - 11/27/18 1638    Clinical Impression Statement  Pt continues to be motivated to improve his expressive language skills. Functionally, he communicates better in client centered conversations than structured word finding activities. He was encouraged to practice the check writing template daily at home. Will decrease SLP sessions to once per week x 5 weeks in preparation for discharge. Goals have been update above. Moderate expressive aphasia persists.     Speech Therapy Frequency  1x /week    Duration  --   5 weeks   Treatment/Interventions  Compensatory strategies;Cueing hierarchy;Functional tasks;Patient/family education;Multimodal communcation approach;Compensatory techniques;Internal/external aids;SLP instruction and feedback;Language facilitation    Potential to Achieve Goals  Good    Potential Considerations  --  time  post stroke   SLP Home Exercise Plan  Pt will complete HEP as assigned to facilitate carryover of treatment strategies and techniques in home environment with written cues provided.    Consulted and Agree with Plan of Care  Patient       Patient will benefit from skilled therapeutic intervention in order to improve the following deficits and impairments:   Aphasia  Apraxia    Problem List There are no active problems to display for this patient.  Thank you,  Dabney Porter, CCC-SLP [phone]  PORTER,DABNEY 12/01/2018, 3:07 PM  [place] [address] [place], [place], [address] Phone: [phone]   Fax:  [phone]   Name: Tarron Zambito MRN: [id number] Date of Birth: 04/03/1951 

## 2018-12-01 ENCOUNTER — Ambulatory Visit (HOSPITAL_COMMUNITY): Payer: Medicare Other | Admitting: Speech Pathology

## 2018-12-02 ENCOUNTER — Encounter (HOSPITAL_COMMUNITY): Payer: Self-pay | Admitting: Speech Pathology

## 2018-12-02 ENCOUNTER — Ambulatory Visit (HOSPITAL_COMMUNITY): Payer: Medicare Other | Admitting: Speech Pathology

## 2018-12-02 DIAGNOSIS — R4701 Aphasia: Secondary | ICD-10-CM | POA: Diagnosis not present

## 2018-12-02 DIAGNOSIS — R482 Apraxia: Secondary | ICD-10-CM

## 2018-12-02 NOTE — Therapy (Signed)
[place], [place], [address] Phone: [phone]   Fax:  [phone]  Speech Language Pathology Treatment  Patient Details  Name: [name] MRN: [id number] Date of Birth: [birth year] Referring Provider (SLP): Wynona Canes, MD   Encounter Date: 12/02/2018  End of Session - 12/02/18 1711    Visit Number  24    Number of Visits  29    Date for SLP Re-Evaluation  01/01/19    Authorization Type  Medicare     SLP Start Time  0539    SLP Stop Time   7673    SLP Time Calculation (min)  49 min    Activity Tolerance  Patient tolerated treatment well       History reviewed. No pertinent past medical history.  History reviewed. No pertinent surgical history.  There were no vitals filed for this visit.  Subjective Assessment - 12/02/18 1604    Subjective  "Sometimes I just can't do it."    Patient is accompained by:  Family member    Currently in Pain?  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 He received outpatient SLP therapy in [place] from 09/2017 to 02/2018 when he was discharged due to Pt wanting to take a break from therapy (long drive for family). He had made excellent gains during treatment. He elected to resume SLP therapy closer to his home in [place] over the summer, but felt like it was not a good fit for his current needs. He is here today accompanied by his wife, [name].      Treatment Provided   Treatment provided  Cognitive-Linquistic      Pain Assessment   Pain Assessment  No/denies pain      Cognitive-Linquistic Treatment   Treatment focused on  Aphasia;Apraxia;Patient/family/caregiver education    Skilled Treatment  SLP provided skilled treatment targeting expressive and receptive language goals during client centered conversation and structured word finding activities. Pt answered "Where" questions with 75% acc with moderate SLP cues. During conversation, SLP supplied min cues via verbal prompts, queries, and written cues. Goals for next treatment period were reviewed with Pt and spouse. SLP plans to type out communication cues (list of relevant vocabulary) before next session and asked that Pt/spouse generate a list of their own that can be added next session.         Assessment / Recommendations / Plan   Plan  Continue with current plan of care         SLP Short Term Goals - 12/02/18 1719      SLP SHORT TERM GOAL #1   Title  Pt will increase naming of low frequency objects/pictures to 80% acc when provided with min/mod multimodality cues.   10/7 change to mi/mod cues   Baseline  70%    Time  4    Period  Weeks    Status  Achieved      SLP SHORT TERM GOAL #2   Title  Pt will implement word-finding strategies with 90% accuracy when unable to verbalize desired word in conversation/functional tasks with mi/mod assist.   11/27/18  continue and add use of notebook for assist   Baseline  Few attempts made to use strategies when blocked    Time  5    Period  Weeks    Status  On-going      SLP SHORT TERM GOAL #3   Title  Pt will describe objects and pictures by providing at least three salient features as judged by clinician with 90% acc when provided mi/mod cues.   11/27/18: continue goal   Baseline  mod/max cues    Time  5    Period  Weeks    Status  On-going      SLP SHORT TERM GOAL #4   Title  Pt will increase MLU to 8+ words when describing pictures with use of scripted cues and carrier phrases as needed on 8/10 trials.   10/7 change to 8+   Baseline  4+    Time  4    Period  Weeks    Status  Achieved      SLP SHORT TERM GOAL #5   Title  Pt will increase reading  comprehension of short sentences with single word sentence completion and word bank provided to 90% acc with min assist.   change to 90% min assist    Baseline  50%    Time  4    Period  Weeks    Status  Achieved      SLP SHORT TERM GOAL #6   Title  Pt will write single words with 75% acc when looking at pictures, discussing family or high interest topics with mod assist from SLP   11/27/18: D/C goal   Baseline  40-60%    Time  4    Period  Weeks    Status  Partially Met      SLP SHORT TERM GOAL #7   Title  Pt will utilize check writing template to write sample personal checks with 95% acc with cue for error awareness.    Baseline  Template introduced two weeks ago; 80% acc    Time  5    Period  Weeks    Status  On-going       SLP Long Term Goals - 12/02/18 1720      SLP LONG TERM GOAL #1   Title  Pt will increase reading comprehension for sentences to [facility] with use of strategies and min assist.     Baseline  mod assist    Time  8    Period  Weeks    Status  On-going      SLP LONG TERM GOAL #2   Title  Pt will be able to participate in a 5-minute conversation about topic of his choosing (personal interest) with use of written SLP cues for support and min assist.     Baseline  mod assist    Time  8    Period  Weeks    Status  On-going      SLP LONG TERM GOAL #3   Title  Pt will be able to write high frequency words with 90% acc with use of word bank/template as needed and indirect cues.     Baseline  50% and does not have template to choose from    Time  8    Period  Weeks    Status  On-going       Plan - 12/02/18 1712    Clinical Impression Statement  Pt presents with mild/moderate expressive language deficits significant  for dysnomia negatively impacting conversational speech. He continues with good effort to use strategies to assist, however he is hesitant to use written cues outside of the treatment room. SLP plans to create a typed list of relevant vocabulary words  for him to keep in his communicaiton notebooks. This will be reviewed and utilized during next week's session.     Speech Therapy Frequency  1x /week    Duration  --   5 weeks   Treatment/Interventions  Compensatory strategies;Cueing hierarchy;Functional tasks;Patient/family education;Multimodal communcation approach;Compensatory techniques;Internal/external aids;SLP instruction and feedback;Language facilitation    Potential to Achieve Goals  Good    Potential Considerations  --   time post stroke   SLP Home Exercise Plan  Pt will complete HEP as assigned to facilitate carryover of treatment strategies and techniques in home environment with written cues provided.    Consulted and Agree with Plan of Care  Patient       Patient will benefit from skilled therapeutic intervention in order to improve the following deficits and impairments:   Aphasia  Apraxia    Problem List There are no active problems to display for this patient.  Thank you,  Genene Churn, [place]  [facility] 12/02/2018, 5:20 PM  [place] [address], [place], [address] Phone: [phone]   Fax:  [phone]   Name: [name] MRN: [id number] Date of Birth: 1951-06-03

## 2018-12-09 ENCOUNTER — Encounter (HOSPITAL_COMMUNITY): Payer: PRIVATE HEALTH INSURANCE | Admitting: Speech Pathology

## 2018-12-10 ENCOUNTER — Encounter (HOSPITAL_COMMUNITY): Payer: Self-pay | Admitting: Speech Pathology

## 2018-12-10 ENCOUNTER — Ambulatory Visit (HOSPITAL_COMMUNITY): Payer: Medicare Other | Admitting: Speech Pathology

## 2018-12-10 DIAGNOSIS — R482 Apraxia: Secondary | ICD-10-CM

## 2018-12-10 DIAGNOSIS — R4701 Aphasia: Secondary | ICD-10-CM | POA: Diagnosis not present

## 2018-12-10 NOTE — Therapy (Signed)
[facility] [place], [place], [address] Phone: [phone]   Fax:  [phone]  Speech Language Pathology Treatment  Patient Details  Name: [name] MRN: [id number] Date of Birth: 26-Feb-1951 Referring Provider (SLP): Wynona Canes, MD   Encounter Date: 12/10/2018  End of Session - 12/10/18 1258    Visit Number  25    Number of Visits  29    Date for SLP Re-Evaluation  01/01/19    Authorization Type  Medicare     SLP Start Time  9735    SLP Stop Time   1210    SLP Time Calculation (min)  47 min    Activity Tolerance  Patient tolerated treatment well       History reviewed. No pertinent past medical history.  History reviewed. No pertinent surgical history.  There were no vitals filed for this visit.  Subjective Assessment - 12/10/18 1252    Subjective  "I just want to get everything back."    Currently in Pain?  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 He received outpatient SLP therapy in [place] from 09/2017 to 02/2018 when he was discharged due to Pt wanting to take a break from therapy (long drive for family). He had made excellent gains during treatment. He elected to resume SLP therapy closer to his home in [place] over the summer, but felt like it was not a good fit for his current needs. He is here today accompanied by his wife, [name].      Treatment Provided   Treatment provided  Cognitive-Linquistic      Pain Assessment   Pain Assessment  No/denies pain      Cognitive-Linquistic Treatment   Treatment focused on  Aphasia;Apraxia;Patient/family/caregiver education    Skilled Treatment  SLP  provided skilled treatment targeting expressive and receptive language goals during client centered conversation and structured word finding activities.  Pt brought a list of relevant vocabulary that he and his wife created. SLP added to the list and provided mild/mod cues to Pt verbalize relevant tools, family members, and car vocabulary. Pt able to refer to the list with min cues for navigation. SLP will continue to add to list and print out for Pt.       Assessment / Recommendations / Plan   Plan  Continue with current plan of care         SLP Short Term Goals - 12/10/18 1259      SLP SHORT TERM GOAL #1   Title  Pt will increase naming of low frequency objects/pictures to 80% acc when provided with min/mod multimodality cues.   10/7 change to mi/mod cues   Baseline  70%    Time  4    Period  Weeks    Status  Achieved      SLP SHORT TERM GOAL #2   Title  Pt will implement word-finding strategies with 90% accuracy when unable to verbalize desired word in conversation/functional tasks with mi/mod assist.   11/27/18 continue and add use of notebook for assist   Baseline  Few  attempts made to use strategies when blocked    Time  5    Period  Weeks    Status  On-going      SLP SHORT TERM GOAL #3   Title  Pt will describe objects and pictures by providing at least three salient features as judged by clinician with 90% acc when provided mi/mod cues.   11/27/18: continue goal   Baseline  mod/max cues    Time  5    Period  Weeks    Status  On-going      SLP SHORT TERM GOAL #4   Title  Pt will increase MLU to 8+ words when describing pictures with use of scripted cues and carrier phrases as needed on 8/10 trials.   10/7 change to 8+   Baseline  4+    Time  4    Period  Weeks    Status  Achieved      SLP SHORT TERM GOAL #5   Title  Pt will increase reading comprehension of short sentences with single word sentence completion and word bank provided to 90% acc with min assist.    change to 90% min assist    Baseline  50%    Time  4    Period  Weeks    Status  Achieved      SLP SHORT TERM GOAL #6   Title  Pt will write single words with 75% acc when looking at pictures, discussing family or high interest topics with mod assist from SLP   11/27/18: D/C goal   Baseline  40-60%    Time  4    Period  Weeks    Status  Partially Met      SLP SHORT TERM GOAL #7   Title  Pt will utilize check writing template to write sample personal checks with 95% acc with cue for error awareness.    Baseline  Template introduced two weeks ago; 80% acc    Time  5    Period  Weeks    Status  On-going       SLP Long Term Goals - 12/10/18 1300      SLP LONG TERM GOAL #1   Title  Pt will increase reading comprehension for sentences to [facility] with use of strategies and min assist.     Baseline  mod assist    Time  8    Period  Weeks    Status  On-going      SLP LONG TERM GOAL #2   Title  Pt will be able to participate in a 5-minute conversation about topic of his choosing (personal interest) with use of written SLP cues for support and min assist.     Baseline  mod assist    Time  8    Period  Weeks    Status  On-going      SLP LONG TERM GOAL #3   Title  Pt will be able to write high frequency words with 90% acc with use of word bank/template as needed and indirect cues.     Baseline  50% and does not have template to choose from    Time  8    Period  Weeks    Status  On-going       Plan - 12/10/18 1259    Clinical Impression Statement  Pt presents with mild/moderate expressive language deficits significant for dysnomia negatively impacting conversational speech. He continues with good effort to use  strategies to assist, however he is hesitant to use written cues outside of the treatment room. SLP plans to create a typed list of relevant vocabulary words for him to keep in his communicaiton notebooks. This will be reviewed and utilized during next week's session.      Speech Therapy Frequency  1x /week    Duration  --   5 weeks   Treatment/Interventions  Compensatory strategies;Cueing hierarchy;Functional tasks;Patient/family education;Multimodal communcation approach;Compensatory techniques;Internal/external aids;SLP instruction and feedback;Language facilitation    Potential to Achieve Goals  Good    Potential Considerations  --   time post stroke   SLP Home Exercise Plan  Pt will complete HEP as assigned to facilitate carryover of treatment strategies and techniques in home environment with written cues provided.    Consulted and Agree with Plan of Care  Patient       Patient will benefit from skilled therapeutic intervention in order to improve the following deficits and impairments:   Aphasia  Apraxia    Problem List There are no active problems to display for this patient.  Thank you,  Genene Churn, [place]  [facility] 12/10/2018, 1:01 PM  [facility] [address] [place], [place], [address] Phone: [phone]   Fax:  [phone]   Name: [name] MRN: [id number] Date of Birth: February 08, 1951

## 2018-12-11 ENCOUNTER — Encounter (HOSPITAL_COMMUNITY): Payer: PRIVATE HEALTH INSURANCE | Admitting: Speech Pathology

## 2018-12-18 ENCOUNTER — Ambulatory Visit (HOSPITAL_COMMUNITY): Payer: Medicare Other | Admitting: Speech Pathology

## 2018-12-18 ENCOUNTER — Encounter (HOSPITAL_COMMUNITY): Payer: Self-pay | Admitting: Speech Pathology

## 2018-12-18 DIAGNOSIS — R482 Apraxia: Secondary | ICD-10-CM

## 2018-12-18 DIAGNOSIS — R4701 Aphasia: Secondary | ICD-10-CM

## 2018-12-18 NOTE — Therapy (Signed)
[facility] [place], [place], [address] Phone: [phone]   Fax:  [phone]  Speech Language Pathology Treatment  Patient Details  Name: Tyde Lamison MRN: [id number] Date of Birth: 1951/11/13 Referring Provider (SLP): Wynona Canes, MD   Encounter Date: 12/18/2018  End of Session - 12/18/18 1650    Visit Number  26    Number of Visits  29    Date for SLP Re-Evaluation  01/01/19    Authorization Type  Medicare     SLP Start Time  1600    SLP Stop Time   4888    SLP Time Calculation (min)  45 min    Activity Tolerance  Patient tolerated treatment well       History reviewed. No pertinent past medical history.  History reviewed. No pertinent surgical history.  There were no vitals filed for this visit.  Subjective Assessment - 12/18/18 1612    Subjective  "We are having family for dinner."    Currently in Pain?  No/denies       ADULT SLP TREATMENT - 12/18/18 0001      General Information   Oral care provided  N/A    HPI  Mr. 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 He received outpatient SLP therapy in [place] from 09/2017 to 02/2018 when he was discharged due to Pt wanting to take a break from therapy (long drive for family). He had made excellent gains during treatment. He elected to resume SLP therapy closer to his home in [place] over the summer, but felt like it was not a good fit for his current needs. He is here today accompanied by his wife, [name].      Treatment Provided   Treatment provided  Cognitive-Linquistic      Pain Assessment   Pain Assessment  No/denies pain      Cognitive-Linquistic Treatment   Treatment focused on  Aphasia;Apraxia;Patient/family/caregiver education    Skilled Treatment  SLP provided skilled treatment targeting expressive and receptive language goals during client centered conversation and  structured word finding activities. Pt initiated a conversation about his Christmas. SLP initially pointed to his "family" section and then Pt able to refer to the list with min cues for navigation. SLP will continue to add to list and print out for Pt.       Assessment / Recommendations / Plan   Plan  Continue with current plan of care         SLP Short Term Goals - 12/18/18 1655      SLP SHORT TERM GOAL #1   Title  Pt will increase naming of low frequency objects/pictures to 80% acc when provided with min/mod multimodality cues.   10/7 change to mi/mod cues   Baseline  70%    Time  4    Period  Weeks    Status  Achieved      SLP SHORT TERM GOAL #2   Title  Pt will implement word-finding strategies with 90% accuracy when unable to verbalize desired word in conversation/functional tasks with mi/mod assist.   11/27/18 continue and add use of notebook for assist   Baseline  Few attempts made to use strategies when blocked    Time  5    Period  Weeks    Status  On-going      SLP SHORT TERM GOAL #3   Title  Pt will describe objects and pictures by providing at least three salient features as judged by clinician with 90% acc when provided mi/mod cues.   11/27/18: continue goal   Baseline  mod/max cues    Time  5    Period  Weeks    Status  On-going      SLP SHORT TERM GOAL #4   Title  Pt will increase MLU to 8+ words when describing pictures with use of scripted cues and carrier phrases as needed on 8/10 trials.   10/7 change to 8+   Baseline  4+    Time  4    Period  Weeks    Status  Achieved      SLP SHORT TERM GOAL #5   Title  Pt will increase reading comprehension of short sentences with single word sentence completion and word bank provided to 90% acc with min assist.   change to 90% min assist    Baseline  50%    Time  4    Period  Weeks    Status  Achieved      SLP SHORT TERM GOAL #6   Title  Pt will write single words with 75% acc when looking at pictures,  discussing family or high interest topics with mod assist from SLP   11/27/18: D/C goal   Baseline  40-60%    Time  4    Period  Weeks    Status  Partially Met      SLP SHORT TERM GOAL #7   Title  Pt will utilize check writing template to write sample personal checks with 95% acc with cue for error awareness.    Baseline  Template introduced two weeks ago; 80% acc    Time  5    Period  Weeks    Status  On-going       SLP Long Term Goals - 12/18/18 1656      SLP LONG TERM GOAL #1   Title  Pt will increase reading comprehension for sentences to [facility] with use of strategies and min assist.     Baseline  mod assist    Time  8    Period  Weeks    Status  On-going      SLP LONG TERM GOAL #2   Title  Pt will be able to participate in a 5-minute conversation about topic of his choosing (personal interest) with use of written SLP cues for support and min assist.     Baseline  mod assist    Time  8    Period  Weeks    Status  On-going      SLP LONG TERM GOAL #3   Title  Pt will be able to write high frequency words with 90% acc with use of word bank/template as needed and indirect cues.     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 He continues to benefit from written word bank of relevant vocabulary to refer to during conversations. Plan for two more sessions in preparation for discharge from therapy.     Speech Therapy Frequency  1x /week    Duration  --   5  weeks   Treatment/Interventions  Compensatory strategies;Cueing hierarchy;Functional tasks;Patient/family education;Multimodal communcation approach;Compensatory techniques;Internal/external aids;SLP instruction and feedback;Language facilitation    Potential to Achieve Goals   Good    Potential Considerations  --   time post stroke   SLP Home Exercise Plan  Pt will complete HEP as assigned to facilitate carryover of treatment strategies and techniques in home environment with written cues provided.    Consulted and Agree with Plan of Care  Patient       Patient will benefit from skilled therapeutic intervention in order to improve the following deficits and impairments:   Aphasia  Apraxia    Problem List There are no active problems to display for this patient.   Almin Livingstone 12/18/2018, 4:56 PM  [facility] [address] [place], [place], [address] Phone: [phone]   Fax:  [phone]   Name: Veniamin Kincaid MRN: [id number] Date of Birth: [birth year]

## 2018-12-22 ENCOUNTER — Ambulatory Visit (HOSPITAL_COMMUNITY): Payer: Medicare Other | Admitting: Speech Pathology

## 2018-12-25 ENCOUNTER — Encounter (HOSPITAL_COMMUNITY): Payer: PRIVATE HEALTH INSURANCE | Admitting: Speech Pathology

## 2018-12-29 ENCOUNTER — Ambulatory Visit (HOSPITAL_COMMUNITY): Payer: Medicare Other | Admitting: Speech Pathology

## 2018-12-31 ENCOUNTER — Telehealth (HOSPITAL_COMMUNITY): Payer: Self-pay | Admitting: Family Medicine

## 2018-12-31 NOTE — Telephone Encounter (Signed)
12/31/18  wife called to reschedule she said they couldn't make it

## 2019-01-01 ENCOUNTER — Encounter (HOSPITAL_COMMUNITY): Payer: Medicare Other | Admitting: Speech Pathology

## 2019-01-05 ENCOUNTER — Encounter (HOSPITAL_COMMUNITY): Payer: Self-pay | Admitting: Speech Pathology

## 2019-01-05 ENCOUNTER — Ambulatory Visit (HOSPITAL_COMMUNITY): Payer: Medicare Other | Attending: Neurology | Admitting: Speech Pathology

## 2019-01-05 DIAGNOSIS — R4701 Aphasia: Secondary | ICD-10-CM | POA: Diagnosis present

## 2019-01-05 DIAGNOSIS — R482 Apraxia: Secondary | ICD-10-CM | POA: Insufficient documentation

## 2019-01-05 NOTE — Therapy (Signed)
[place], [place], [address] Phone: [phone]   Fax:  [phone]  Speech Language Pathology Treatment  Patient Details  Name: [name] MRN: [id number] Date of Birth: [birth year] Referring Provider (SLP): Wynona Canes, MD   Encounter Date: 01/05/2019  End of Session - 01/05/19 1036    Visit Number  27    Number of Visits  30    Date for SLP Re-Evaluation  01/22/19    Authorization Type  Medicare     SLP Start Time  0900    SLP Stop Time   0950    SLP Time Calculation (min)  50 min    Activity Tolerance  Patient tolerated treatment well       History reviewed. No pertinent past medical history.  History reviewed. No pertinent surgical history.  There were no vitals filed for this visit.  Subjective Assessment - 01/05/19 1029    Subjective  "We went to the beach."    Patient is accompained by:  Family member    Currently in Pain?  No/denies       ADULT SLP TREATMENT - 01/05/19 0001      General Information   Behavior/Cognition  Alert;Cooperative;Pleasant mood    Patient Positioning  Upright in chair    Oral care provided  N/A    HPI  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 He received outpatient SLP therapy in [place] from 09/2017 to 02/2018 when he was discharged due to Pt wanting to take a break from therapy (long drive for family). He had made excellent gains during treatment. He elected to resume SLP therapy closer to his home in [place] over the summer, but felt like it was not a good fit for his current needs. He is here today accompanied by his wife, [name].      Treatment Provided   Treatment provided  Cognitive-Linquistic      Pain Assessment   Pain Assessment  No/denies pain      Cognitive-Linquistic Treatment   Treatment focused on  Aphasia;Apraxia;Patient/family/caregiver education    Skilled  Treatment  SLP provided skilled treatment targeting expressive and receptive language goals during client centered conversation and structured word finding activities.  SLP provided word bank for communication book. Pt able to orally read list of words with min cues. Pt's wife assisted in adding additional friends' names to add to the list. Pt asked to go through his garage with a friend and add more tools to his list for next session.      Assessment / Recommendations / Plan   Plan  Continue with current plan of care       SLP Education - 01/05/19 1034    Education Details  Pt and spouse asked to add to "tools" list after walking through his garage    Person(s) Educated  Patient;Spouse    Methods  Explanation    Comprehension  Verbalized understanding       SLP Short Term Goals - 01/05/19 1038      SLP SHORT TERM GOAL #1   Title  Pt will increase naming of low frequency objects/pictures to 80% acc when provided with min/mod multimodality cues.   10/7 change to mi/mod cues   Baseline  70%    Time  4    Period  Weeks    Status  Achieved  SLP SHORT TERM GOAL #2   Title  Pt will implement word-finding strategies with 90% accuracy when unable to verbalize desired word in conversation/functional tasks with mi/mod assist.   11/27/18 continue and add use of notebook for assist   Baseline  Few attempts made to use strategies when blocked    Time  5    Period  Weeks    Status  On-going      SLP SHORT TERM GOAL #3   Title  Pt will describe objects and pictures by providing at least three salient features as judged by clinician with 90% acc when provided mi/mod cues.   11/27/18: continue goal   Baseline  mod/max cues    Time  5    Period  Weeks    Status  On-going      SLP SHORT TERM GOAL #4   Title  Pt will increase MLU to 8+ words when describing pictures with use of scripted cues and carrier phrases as needed on 8/10 trials.   10/7 change to 8+   Baseline  4+    Time  4     Period  Weeks    Status  Achieved      SLP SHORT TERM GOAL #5   Title  Pt will increase reading comprehension of short sentences with single word sentence completion and word bank provided to 90% acc with min assist.   change to 90% min assist    Baseline  50%    Time  4    Period  Weeks    Status  Achieved      SLP SHORT TERM GOAL #6   Title  Pt will write single words with 75% acc when looking at pictures, discussing family or high interest topics with mod assist from SLP   11/27/18: D/C goal   Baseline  40-60%    Time  4    Period  Weeks    Status  Partially Met      SLP SHORT TERM GOAL #7   Title  Pt will utilize check writing template to write sample personal checks with 95% acc with cue for error awareness.    Baseline  Template introduced two weeks ago; 80% acc    Time  5    Period  Weeks    Status  On-going       SLP Long Term Goals - 01/05/19 1038      SLP LONG TERM GOAL #1   Title  Pt will increase reading comprehension for sentences to [facility] with use of strategies and min assist.     Baseline  mod assist    Time  8    Period  Weeks    Status  On-going      SLP LONG TERM GOAL #2   Title  Pt will be able to participate in a 5-minute conversation about topic of his choosing (personal interest) with use of written SLP cues for support and min assist.     Baseline  mod assist    Time  8    Period  Weeks    Status  On-going      SLP LONG TERM GOAL #3   Title  Pt will be able to write high frequency words with 90% acc with use of word bank/template as needed and indirect cues.     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 He continues to benefit from written word bank of relevant vocabulary to refer to during  conversations. Pt missed a few appointments over the holidays so authorization for extension for more visits pursued this date (3 sessions) in preparation for discharge.   Speech Therapy Frequency  1x /week    Duration  --   5 weeks   Treatment/Interventions  Compensatory strategies;Cueing hierarchy;Functional tasks;Patient/family education;Multimodal communcation approach;Compensatory techniques;Internal/external aids;SLP instruction and feedback;Language facilitation    Potential to Achieve Goals  Good    Potential Considerations  --   time post stroke   SLP Home Exercise Plan  Pt will complete HEP as assigned to facilitate carryover of treatment strategies and techniques in home environment with written cues provided.    Consulted and Agree with Plan of Care  Patient       Patient will benefit from skilled therapeutic intervention in order to improve the following deficits and impairments:   Aphasia  Apraxia    Problem List There are no active problems to display for this patient.  Thank you,  Genene Churn, [place]  [facility] 01/05/2019, 10:39 AM  [place] [address] [place], [place], [address] Phone: [phone]   Fax:  [phone]   Name: [name] MRN: [id number] Date of Birth: Aug 11, 1951

## 2019-01-14 ENCOUNTER — Ambulatory Visit (HOSPITAL_COMMUNITY): Payer: Medicare Other | Admitting: Speech Pathology

## 2019-01-14 ENCOUNTER — Encounter (HOSPITAL_COMMUNITY): Payer: Self-pay | Admitting: Speech Pathology

## 2019-01-14 DIAGNOSIS — R4701 Aphasia: Secondary | ICD-10-CM

## 2019-01-14 DIAGNOSIS — R482 Apraxia: Secondary | ICD-10-CM

## 2019-01-14 NOTE — Therapy (Signed)
[place], [place], [address] Phone: [phone]   Fax:  [phone]  Speech Language Pathology Treatment  Patient Details  Name: [name] MRN: [id number] Date of Birth: Apr 22, 1951 Referring Provider (SLP): Wynona Canes, MD   Encounter Date: 01/14/2019  End of Session - 01/14/19 1247    Visit Number  28    Number of Visits  30    Date for SLP Re-Evaluation  01/22/19    Authorization Type  Medicare     SLP Start Time  0102    SLP Stop Time   7253    SLP Time Calculation (min)  45 min    Activity Tolerance  Patient tolerated treatment well       History reviewed. No pertinent past medical history.  History reviewed. No pertinent surgical history.  There were no vitals filed for this visit.  Subjective Assessment - 01/14/19 1243    Subjective  "Sometimes I can't do anything."    Patient is accompained by:  Family member    Currently in Pain?  No/denies        ADULT SLP TREATMENT - 01/14/19 0001      General Information   Behavior/Cognition  Alert;Cooperative;Pleasant mood    Patient Positioning  Upright in chair    Oral care provided  N/A    HPI  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 He received outpatient SLP therapy in [place] from 09/2017 to 02/2018 when he was discharged due to Pt wanting to take a break from therapy (long drive for family). He had made excellent gains during treatment. He elected to resume SLP therapy closer to his home in [place] over the summer, but felt like it was not a good fit for his current needs. He is here today accompanied by his wife, [name].      Treatment Provided   Treatment provided  Cognitive-Linquistic      Pain Assessment   Pain Assessment  No/denies pain      Cognitive-Linquistic Treatment   Treatment focused on  Aphasia;Apraxia;Patient/family/caregiver education     Skilled Treatment SLP provided skilled treatment targeting expressive and receptive language goals during client centered conversation and structured word finding activities. Pt was previously asked to go through his garage with a friend and add more tools to his list, however this was not completed. He continues to need encouragement to use compensatory communication strategies.     Assessment / Recommendations / Plan   Plan  Continue with current plan of care       SLP Education - 01/14/19 1246    Education Details  Discussed two more treatment sessions before discharge from SLP therapy    Person(s) Educated  Patient;Spouse    Methods  Explanation    Comprehension  Verbalized understanding       SLP Short Term Goals - 01/14/19 1247      SLP SHORT TERM GOAL #1   Title  Pt will increase naming of low frequency objects/pictures to 80% acc when provided with min/mod multimodality cues.   10/7 change to mi/mod cues   Baseline  70%    Time  4    Period  Weeks    Status  Achieved      SLP SHORT TERM GOAL #2   Title  Pt will implement word-finding strategies with 90% accuracy when unable to verbalize desired  word in conversation/functional tasks with mi/mod assist.   11/27/18 continue and add use of notebook for assist   Baseline  Few attempts made to use strategies when blocked    Time  5    Period  Weeks    Status  On-going      SLP SHORT TERM GOAL #3   Title  Pt will describe objects and pictures by providing at least three salient features as judged by clinician with 90% acc when provided mi/mod cues.   11/27/18: continue goal   Baseline  mod/max cues    Time  5    Period  Weeks    Status  On-going      SLP SHORT TERM GOAL #4   Title  Pt will increase MLU to 8+ words when describing pictures with use of scripted cues and carrier phrases as needed on 8/10 trials.   10/7 change to 8+   Baseline  4+    Time  4    Period  Weeks    Status  Achieved      SLP SHORT TERM GOAL  #5   Title  Pt will increase reading comprehension of short sentences with single word sentence completion and word bank provided to 90% acc with min assist.   change to 90% min assist    Baseline  50%    Time  4    Period  Weeks    Status  Achieved      SLP SHORT TERM GOAL #6   Title  Pt will write single words with 75% acc when looking at pictures, discussing family or high interest topics with mod assist from SLP   11/27/18: D/C goal   Baseline  40-60%    Time  4    Period  Weeks    Status  Partially Met      SLP SHORT TERM GOAL #7   Title  Pt will utilize check writing template to write sample personal checks with 95% acc with cue for error awareness.    Baseline  Template introduced two weeks ago; 80% acc    Time  5    Period  Weeks    Status  On-going       SLP Long Term Goals - 01/14/19 1248      SLP LONG TERM GOAL #1   Title  Pt will increase reading comprehension for sentences to [facility] with use of strategies and min assist.     Baseline  mod assist    Time  8    Period  Weeks    Status  On-going      SLP LONG TERM GOAL #2   Title  Pt will be able to participate in a 5-minute conversation about topic of his choosing (personal interest) with use of written SLP cues for support and min assist.     Baseline  mod assist    Time  8    Period  Weeks    Status  On-going      SLP LONG TERM GOAL #3   Title  Pt will be able to write high frequency words with 90% acc with use of word bank/template as needed and indirect cues.     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 He continues to benefit from written word bank of relevant vocabulary to refer to during conversations. He continues to be hesitant to use compensatory  communication strategies (written word bank) and frequently states that he just wants to be able to speak the way he previously did. SLP continues to reiterate the need to try alternatives given time post stroke. He has made tremendous progress, however progress has slowed. Recommend two more treatment sessions in preparation for discharge.   Speech Therapy Frequency  1x /week    Duration  --   5 weeks   Treatment/Interventions  Compensatory strategies;Cueing hierarchy;Functional tasks;Patient/family education;Multimodal communcation approach;Compensatory techniques;Internal/external aids;SLP instruction and feedback;Language facilitation    Potential to Achieve Goals  Good    Potential Considerations  --   time post stroke   SLP Home Exercise Plan  Pt will complete HEP as assigned to facilitate carryover of treatment strategies and techniques in home environment with written cues provided.    Consulted and Agree with Plan of Care  Patient       Patient will benefit from skilled therapeutic intervention in order to improve the following deficits and impairments:   Aphasia  Apraxia    Problem List There are no active problems to display for this patient.  Thank you,  Genene Churn, [place]  [facility] 01/14/2019, 12:48 PM  [place], [place], [address] Phone: [phone]   Fax:  [phone]   Name: [name] MRN: [id number] Date of Birth: February 22, 1951

## 2019-01-19 ENCOUNTER — Ambulatory Visit (HOSPITAL_COMMUNITY): Payer: Medicare Other | Admitting: Speech Pathology

## 2019-01-19 ENCOUNTER — Encounter (HOSPITAL_COMMUNITY): Payer: Self-pay | Admitting: Speech Pathology

## 2019-01-19 DIAGNOSIS — R4701 Aphasia: Secondary | ICD-10-CM | POA: Diagnosis not present

## 2019-01-19 NOTE — Therapy (Signed)
[place], [place], [address] Phone: [phone]   Fax:  [phone]  Speech Language Pathology Treatment  Patient Details  Name: [name] MRN: [id number] Date of Birth: 1951-02-09 Referring Provider (SLP): Wynona Canes, MD   Encounter Date: 01/19/2019  End of Session - 01/19/19 1501    Visit Number  29    Number of Visits  30    Date for SLP Re-Evaluation  01/22/19    Authorization Type  Medicare     SLP Start Time  3299    SLP Stop Time   1430    SLP Time Calculation (min)  45 min    Activity Tolerance  Patient tolerated treatment well       History reviewed. No pertinent past medical history.  History reviewed. No pertinent surgical history.  There were no vitals filed for this visit.  Subjective Assessment - 01/19/19 1352    Subjective  "I made stew at [name]."    Patient is accompained by:  Family member    Currently in Pain?  No/denies        ADULT SLP TREATMENT - 01/19/19 0001      General Information   Behavior/Cognition  Alert;Cooperative;Pleasant mood    Patient Positioning  Upright in chair    Oral care provided  N/A    HPI  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 He received outpatient SLP therapy in [place] from 09/2017 to 02/2018 when he was discharged due to Pt wanting to take a break from therapy (long drive for family). He had made excellent gains during treatment. He elected to resume SLP therapy closer to his home in [place] over the summer, but felt like it was not a good fit for his current needs. He is here today accompanied by his wife, [name].      Treatment Provided   Treatment provided  Cognitive-Linquistic      Pain Assessment   Pain Assessment  No/denies pain      Cognitive-Linquistic Treatment   Treatment focused on  Aphasia;Apraxia;Patient/family/caregiver education    Skilled Treatment  SLP provided skilled treatment targeting expressive and receptive language goals during client centered conversation and structured word finding activities.  SLP provided word bank for communication book. Pt able to orally read list of words with min cues and assist with finalizing word lists. Next session, will focus on asking Pt questions which require assist from communication log to answer in preparation for discharge.     Assessment / Recommendations / Plan   Plan  Continue with current plan of care      Progression Toward Goals   Progression toward goals  Progressing toward goals         SLP Short Term Goals - 01/19/19 1501      SLP SHORT TERM GOAL #1   Title  Pt will increase naming of low frequency objects/pictures to 80% acc when provided with min/mod multimodality cues.   10/7 change to mi/mod cues   Baseline  70%    Time  4    Period  Weeks    Status  Achieved      SLP SHORT TERM GOAL #2   Title  Pt will implement word-finding strategies with 90% accuracy when unable to verbalize desired word in conversation/functional tasks with mi/mod assist.   11/27/18 continue and add use of notebook for  assist   Baseline  Few attempts made to use strategies when blocked    Time  5    Period  Weeks    Status  On-going      SLP SHORT TERM GOAL #3   Title  Pt will describe objects and pictures by providing at least three salient features as judged by clinician with 90% acc when provided mi/mod cues.   11/27/18: continue goal   Baseline  mod/max cues    Time  5    Period  Weeks    Status  On-going      SLP SHORT TERM GOAL #4   Title  Pt will increase MLU to 8+ words when describing pictures with use of scripted cues and carrier phrases as needed on 8/10 trials.   10/7 change to 8+   Baseline  4+    Time  4    Period  Weeks    Status  Achieved      SLP SHORT TERM GOAL #5   Title  Pt will increase reading comprehension of short sentences with single word  sentence completion and word bank provided to 90% acc with min assist.   change to 90% min assist    Baseline  50%    Time  4    Period  Weeks    Status  Achieved      SLP SHORT TERM GOAL #6   Title  Pt will write single words with 75% acc when looking at pictures, discussing family or high interest topics with mod assist from SLP   11/27/18: D/C goal   Baseline  40-60%    Time  4    Period  Weeks    Status  Partially Met      SLP SHORT TERM GOAL #7   Title  Pt will utilize check writing template to write sample personal checks with 95% acc with cue for error awareness.    Baseline  Template introduced two weeks ago; 80% acc    Time  5    Period  Weeks    Status  On-going       SLP Long Term Goals - 01/19/19 1502      SLP LONG TERM GOAL #1   Title  Pt will increase reading comprehension for sentences to [facility] with use of strategies and min assist.     Baseline  mod assist    Time  8    Period  Weeks    Status  On-going      SLP LONG TERM GOAL #2   Title  Pt will be able to participate in a 5-minute conversation about topic of his choosing (personal interest) with use of written SLP cues for support and min assist.     Baseline  mod assist    Time  8    Period  Weeks    Status  On-going      SLP LONG TERM GOAL #3   Title  Pt will be able to write high frequency words with 90% acc with use of word bank/template as needed and indirect cues.     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 He  continues to benefit from written word bank of relevant vocabulary to refer to during conversations. Recommend one more treatment session in preparation for discharge.    Speech Therapy Frequency  1x /week    Duration  --   5 weeks    Treatment/Interventions  Compensatory strategies;Cueing hierarchy;Functional tasks;Patient/family education;Multimodal communcation approach;Compensatory techniques;Internal/external aids;SLP instruction and feedback;Language facilitation    Potential to Achieve Goals  Good    Potential Considerations  --   time post stroke   SLP Home Exercise Plan  Pt will complete HEP as assigned to facilitate carryover of treatment strategies and techniques in home environment with written cues provided.    Consulted and Agree with Plan of Care  Patient       Patient will benefit from skilled therapeutic intervention in order to improve the following deficits and impairments:   Aphasia    Problem List There are no active problems to display for this patient.  .Thank you,  Genene Churn, [place]   Genene Churn 01/19/2019, 3:22 PM  [place] [address] [place], [place], [address] Phone: [phone]   Fax:  [phone]   Name: [name] MRN: [id number] Date of Birth: 05/02/1951

## 2019-01-21 ENCOUNTER — Encounter (HOSPITAL_COMMUNITY): Payer: Self-pay | Admitting: Speech Pathology

## 2019-01-21 ENCOUNTER — Ambulatory Visit (HOSPITAL_COMMUNITY): Payer: Medicare Other | Admitting: Speech Pathology

## 2019-01-21 DIAGNOSIS — R4701 Aphasia: Secondary | ICD-10-CM | POA: Diagnosis not present

## 2019-01-21 DIAGNOSIS — R482 Apraxia: Secondary | ICD-10-CM

## 2019-01-21 NOTE — Therapy (Signed)
[place], [place], [address] Phone: [phone]   Fax:  [phone]  Speech Language Pathology Treatment  Patient Details  Name: [name] MRN: [id number] Date of Birth: 11/01/1951 Referring Provider (SLP): Wynona Canes, MD   Encounter Date: 01/21/2019  End of Session - 01/21/19 1300    Visit Number  30    Number of Visits  30    Date for SLP Re-Evaluation  01/22/19    Authorization Type  Medicare     SLP Start Time  1030    SLP Stop Time   1115    SLP Time Calculation (min)  45 min    Activity Tolerance  Patient tolerated treatment well       History reviewed. No pertinent past medical history.  History reviewed. No pertinent surgical history.  There were no vitals filed for this visit.  Subjective Assessment - 01/21/19 1255    Subjective  "I guess it will take a long time to get back to how I was."    Currently in Pain?  No/denies         ADULT SLP TREATMENT - 01/21/19 0001      General Information   Behavior/Cognition  Alert;Cooperative;Pleasant mood    Patient Positioning  Upright in chair    Oral care provided  N/A    HPI  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 He received outpatient SLP therapy in [place] from 09/2017 to 02/2018 when he was discharged due to Pt wanting to take a break from therapy (long drive for family). He had made excellent gains during treatment. He elected to resume SLP therapy closer to his home in [place] over the summer, but felt like it was not a good fit for his current needs. He is here today accompanied by his wife, [name].      Treatment Provided   Treatment provided  Cognitive-Linquistic      Pain Assessment   Pain Assessment  No/denies pain      Cognitive-Linquistic Treatment   Treatment focused on  Aphasia;Apraxia;Patient/family/caregiver education    Skilled  Treatment  SLP provided skilled treatment targeting expressive and receptive language goals during client centered conversation and structured word finding activities.  This session was predominantly spent creating an ongoing home exercise program for Pt given discharge from therapy this date. He was given three tasks to complete on a daily basis (check writing, writing automatic sequences, and orall reading from his communication book) and a checklist to accompany it. Pt was able to locate specific words in his communication book with min cues from SLP. He continues to be somewhat resistant about using alternative communication strategies and wants to "say it".       Assessment / Recommendations / Plan   Plan  Discharge SLP treatment due to (comment);All goals met      Progression Toward Goals   Progression toward goals  Goals met, education completed, patient discharged from SLP       SLP Education - 01/21/19 1300    Education Details  Provided three daily tasks for ongoing HEP    Person(s) Educated  Patient    Methods  Explanation;Demonstration    Comprehension  Verbalized understanding       SLP Short Term Goals - 01/21/19 1301      SLP SHORT TERM GOAL #1   Title  Pt will  increase naming of low frequency objects/pictures to 80% acc when provided with min/mod multimodality cues.   10/7 change to mi/mod cues   Baseline  70%    Time  4    Period  Weeks    Status  Achieved      SLP SHORT TERM GOAL #2   Title  Pt will implement word-finding strategies with 90% accuracy when unable to verbalize desired word in conversation/functional tasks with mi/mod assist.   11/27/18 continue and add use of notebook for assist   Baseline  Few attempts made to use strategies when blocked    Time  5    Period  Weeks    Status  Achieved      SLP SHORT TERM GOAL #3   Title  Pt will describe objects and pictures by providing at least three salient features as judged by clinician with 90% acc when  provided mi/mod cues.   11/27/18: continue goal   Baseline  mod/max cues    Time  5    Period  Weeks    Status  Achieved      SLP SHORT TERM GOAL #4   Title  Pt will increase MLU to 8+ words when describing pictures with use of scripted cues and carrier phrases as needed on 8/10 trials.   10/7 change to 8+   Baseline  4+    Time  4    Period  Weeks    Status  Achieved      SLP SHORT TERM GOAL #5   Title  Pt will increase reading comprehension of short sentences with single word sentence completion and word bank provided to 90% acc with min assist.   change to 90% min assist    Baseline  50%    Time  4    Period  Weeks    Status  Achieved      SLP SHORT TERM GOAL #6   Title  Pt will write single words with 75% acc when looking at pictures, discussing family or high interest topics with mod assist from SLP   11/27/18: D/C goal   Baseline  40-60%    Time  4    Period  Weeks    Status  Partially Met      SLP SHORT TERM GOAL #7   Title  Pt will utilize check writing template to write sample personal checks with 95% acc with cue for error awareness.    Baseline  Template introduced two weeks ago; 80% acc    Time  5    Period  Weeks    Status  Achieved       SLP Long Term Goals - 01/21/19 1302      SLP LONG TERM GOAL #1   Title  Pt will increase reading comprehension for sentences to [facility] with use of strategies and min assist.     Baseline  mod assist    Time  8    Period  Weeks    Status  Achieved      SLP LONG TERM GOAL #2   Title  Pt will be able to participate in a 5-minute conversation about topic of his choosing (personal interest) with use of written SLP cues for support and min assist.     Baseline  mod assist    Time  8    Period  Weeks    Status  Achieved      SLP LONG TERM GOAL #3  Title  Pt will be able to write high frequency words with 90% acc with use of word bank/template as needed and indirect cues.     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 He continues to benefit from written word bank of relevant vocabulary to refer to during conversations. He continues to be hesitant to use compensatory communication strategies (written word bank) and frequently states that he just wants to be able to speak the way he previously did. SLP continues to reiterate the need to try alternatives given time post stroke. He has made tremendous progress, however progress has slowed. Pt given HEP and communication book to continue with post discharge. Pt understands that he may benefit from SLP therapy in the future should he experiences changes over the next year. Pt will be discharged from therapy at this time.   Speech Therapy Frequency  1x /week    Duration  --   5 weeks   Treatment/Interventions  Compensatory strategies;Cueing hierarchy;Functional tasks;Patient/family education;Multimodal communcation approach;Compensatory techniques;Internal/external aids;SLP instruction and feedback;Language facilitation    Potential to Achieve Goals  Good    Potential Considerations  --   time post stroke   SLP Home Exercise Plan  Pt will complete HEP as assigned to facilitate carryover of treatment strategies and techniques in home environment with written cues provided.    Consulted and Agree with Plan of Care  Patient       Patient will benefit from skilled therapeutic intervention in order to improve the following deficits and impairments:   Aphasia  Apraxia    Problem List There are no active problems to display for this patient.  SPEECH THERAPY DISCHARGE SUMMARY  Visits from Start of Care: 30  Current functional level related to goals / functional outcomes: Pt with mild to mild/mod expressive  aphasia   Remaining deficits: See above   Education / Equipment: HEP given  Plan: Patient agrees to discharge.  Patient goals were partially met. Patient is being discharged due to meeting the stated rehab goals.  ?????         Thank you,  Genene Churn, [place]  [facility] 01/21/2019, 1:03 PM  [place], [place], [address] Phone: [phone]   Fax:  [phone]   Name: Willem Klingensmith MRN: [id number] Date of Birth: Mar 01, 1951

## 2019-01-28 ENCOUNTER — Encounter (HOSPITAL_COMMUNITY): Payer: Medicare Other | Admitting: Speech Pathology

## 2019-02-04 ENCOUNTER — Encounter (HOSPITAL_COMMUNITY): Payer: PRIVATE HEALTH INSURANCE | Admitting: Speech Pathology

## 2020-08-17 ENCOUNTER — Other Ambulatory Visit: Payer: Self-pay | Admitting: Physician Assistant

## 2020-08-17 DIAGNOSIS — R112 Nausea with vomiting, unspecified: Secondary | ICD-10-CM

## 2020-08-17 DIAGNOSIS — R1084 Generalized abdominal pain: Secondary | ICD-10-CM

## 2020-08-25 ENCOUNTER — Other Ambulatory Visit: Payer: PRIVATE HEALTH INSURANCE

## 2020-08-30 ENCOUNTER — Ambulatory Visit
Admission: RE | Admit: 2020-08-30 | Discharge: 2020-08-30 | Disposition: A | Discharge status: Home / Self Care | Payer: Medicare Other | Source: Ambulatory Visit | Attending: Physician Assistant | Admitting: Physician Assistant

## 2020-08-30 DIAGNOSIS — R112 Nausea with vomiting, unspecified: Secondary | ICD-10-CM

## 2020-08-30 DIAGNOSIS — R1084 Generalized abdominal pain: Secondary | ICD-10-CM

## 2020-09-07 ENCOUNTER — Other Ambulatory Visit: Payer: Self-pay | Admitting: Gastroenterology

## 2020-09-07 DIAGNOSIS — R109 Unspecified abdominal pain: Secondary | ICD-10-CM

## 2020-09-26 ENCOUNTER — Ambulatory Visit
Admission: RE | Admit: 2020-09-26 | Discharge: 2020-09-26 | Disposition: A | Discharge status: Home / Self Care | Payer: Medicare Other | Source: Ambulatory Visit | Attending: Gastroenterology | Admitting: Gastroenterology

## 2020-09-26 ENCOUNTER — Other Ambulatory Visit: Payer: Self-pay

## 2020-09-26 DIAGNOSIS — R109 Unspecified abdominal pain: Secondary | ICD-10-CM

## 2020-09-26 MED ORDER — IOPAMIDOL (ISOVUE-300) INJECTION 61%
100.0000 mL | Freq: Once | INTRAVENOUS | Status: AC | PRN
  Administered 2020-09-26: 100 mL via INTRAVENOUS

## 2021-02-07 ENCOUNTER — Ambulatory Visit (HOSPITAL_COMMUNITY): Payer: Medicare Other | Attending: Neurology | Admitting: Speech Pathology

## 2021-02-07 ENCOUNTER — Other Ambulatory Visit: Payer: Self-pay

## 2021-02-07 ENCOUNTER — Encounter (HOSPITAL_COMMUNITY): Payer: Self-pay | Admitting: Speech Pathology

## 2021-02-07 DIAGNOSIS — R4701 Aphasia: Secondary | ICD-10-CM | POA: Diagnosis present

## 2021-02-07 NOTE — Therapy (Signed)
[facility] [address] [place], [place], [address] Phone: [phone]   Fax:  [phone]  Speech Language Pathology Evaluation  Patient Details  Name: [name] MRN: [id number] Date of Birth: Apr 13, 1951 Referring Provider (SLP): Jay Schlichter, MD   Encounter Date: 02/07/2021   End of Session - 02/07/21 1851    Visit Number 1    Number of Visits 5    Date for SLP Re-Evaluation 03/16/21    Authorization Type Medicare    SLP Start Time 1430    SLP Stop Time  1515    SLP Time Calculation (min) 45 min    Activity Tolerance Patient tolerated treatment well           History reviewed. No pertinent past medical history.  History reviewed. No pertinent surgical history.  There were no vitals filed for this visit.   Subjective Assessment - 02/07/21 1509    Subjective "I can't remember anything to talk."    Patient is accompained by: Family member    Special Tests Portrions of BDAE and BNT short form    Currently in Pain? No/denies              SLP Evaluation [facility] - 02/07/21 1509      SLP Visit Information   SLP Received On 02/07/21    Referring Provider (SLP) Jay Schlichter, MD    Onset Date 2018    Medical Diagnosis s/p CVA      General Information   HPI 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 He had extensive outpatient SLP therapy to address aphasia and was last discharged from outpatient SLP therapy 12/2018. The Pt feels that his ability to verbally express himself has become worse during the last several months, possibly due to COVID isolation.    Behavioral/Cognition alert and cooperative    Mobility Status ambulatory      Balance Screen   Has the patient fallen in the past 6 months No    Has the patient had a decrease in activity level because of a fear of falling?  No    Is the patient reluctant to leave their home because  of a fear of falling?  No      Prior Functional Status   Cognitive/Linguistic Baseline Baseline deficits    Baseline deficit details negatively impacted by aphasia    Type of Home House     Lives With Spouse    Available Support Family    Vocation Retired      IT consultant   Overall Cognitive Status Within Functional Limits for tasks assessed    Memory Appears intact    Awareness Appears intact    Problem Solving Appears intact      Auditory Comprehension   Overall Auditory Comprehension Appears within functional limits for tasks assessed    Yes/No Questions Within Functional Limits    Commands Within Functional Limits    Conversation Moderately complex      Visual Recognition/Discrimination   Discrimination Within Function Limits      Reading Comprehension   Reading Status Impaired    Word level 76-100% accurate    Sentence Level 76-100% accurate    Paragraph Level 51-75% accurate    Functional Environmental (signs, name badge) Within functional limits      Expression   Primary Mode of Expression Verbal      Verbal Expression  Overall Verbal Expression Impaired at baseline    Initiation No impairment    Automatic Speech Name;Social Response;Counting;Day of week    Level of Generative/Spontaneous Verbalization Sentence    Repetition Impaired    Level of Impairment Sentence level    Naming Impairment    Responsive 51-75% accurate    Confrontation 50-74% accurate    Convergent Not tested    Divergent Not tested    Verbal Errors Perseveration    Pragmatics No impairment    Effective Techniques Phonemic cues;Written cues;Sentence completion    Non-Verbal Means of Communication Not applicable      Written Expression   Dominant Hand Right    Written Expression Not tested    Overall Writen Expression --   [facility] for name and address     Oral Motor/Sensory Function   Overall Oral Motor/Sensory Function Appears within functional limits for tasks assessed      Motor  Speech   Overall Motor Speech Appears within functional limits for tasks assessed    Respiration Within functional limits    Phonation Normal    Resonance Within functional limits    Articulation Within functional limitis    Intelligibility Intelligible    Motor Planning Witnin functional limits    Motor Speech Errors Not applicable    Phonation WFL      Standardized Assessments   Standardized Assessments  Boston Diagnostic Apashia Examiniation-3rd;Boston Naming Test-2nd edition   Portions of            SLP Education - 02/07/21 1850    Education Details Plan for short term SLP therapy given time post original stroke    Person(s) Educated Patient;Spouse    Methods Explanation    Comprehension Verbalized understanding               SLP Short Term Goals - 02/07/21 1842      SLP SHORT TERM GOAL #1   Title Pt will completed basic level reading comprehension tasks (phrase and sentence) with 90% acc with min assist from SLP.    Baseline 70%    Time 4    Period Weeks    Status New    Target Date 03/08/21      SLP SHORT TERM GOAL #2   Title Pt will participate in 3-way conversation by answering, asking questions, and interjecting once per turn with min prompts from SLP with use of scripts as needed.    Baseline max prompts    Time 4    Period Weeks    Status New    Target Date 03/08/21      SLP SHORT TERM GOAL #3   Title Pt will describe written words by providing at least three salient features as judged by clinician with 90% acc when provided mi/mod cues.    Baseline mod/max cues    Time 4    Period Weeks    Status New    Target Date 03/08/21      SLP SHORT TERM GOAL #4   Title Pt will orally read functional sentences and phrases with 90% acc with min assist from SLP.    Baseline 70%    Time 4    Period Weeks    Status New    Target Date 03/08/21            SLP Long Term Goals - 02/07/21 1840      SLP LONG TERM GOAL #1   Title Same as short  Plan - 02/07/21 1853    Clinical Impression Statement Pt presents with moderate expressive aphasia with delays in verbal responses and dysnomias. Pt easily becomes frustrated and responds with, "I can't". Both he and his wife indicated that he has become more socially withdrawn and is having a harder time verbally communicating with friends and family. He is motivated to address his communication deficits and is also open to the idea of exploring use of an alternative communication system if it might be beneficial. Will plan for short term therapy 1x per week for one month to determine if additional SLP services might be indicated given he had a lot of therapy following his stroke. Pt will benefit from skilled SLP in order to address the above impairments, maximize independence, and decrease burden of care. Pt/spouse are in agreement with plan of care.   Speech Therapy Frequency 1x /week    Duration 4 weeks    Treatment/Interventions Compensatory strategies;Patient/family education;SLP instruction and feedback;Compensatory techniques    Potential to Achieve Goals Fair    Potential Considerations Previous level of function   Stroke was in 2018   SLP Home Exercise Plan Pt will complete HEP as assigned to facilitate carryover of treatment strategies and techniques in home environment    Consulted and Agree with Plan of Care Patient;Family member/caregiver    Family Member Consulted Spouse           Patient will benefit from skilled therapeutic intervention in order to improve the following deficits and impairments:   Aphasia    Problem List There are no problems to display for this patient.  Thank you,  Havery Moros, CCC-SLP [phone]  [facility] 02/07/2021, 6:56 PM  [facility] [address] [place], [place], [address] Phone: [phone]   Fax:  [phone]  Name: Amer Alcindor MRN: [id number] Date of Birth: 1951-06-24

## 2021-02-14 ENCOUNTER — Encounter (HOSPITAL_COMMUNITY): Payer: Self-pay | Admitting: Speech Pathology

## 2021-02-14 ENCOUNTER — Ambulatory Visit (HOSPITAL_COMMUNITY): Payer: Medicare Other | Admitting: Speech Pathology

## 2021-02-14 ENCOUNTER — Other Ambulatory Visit: Payer: Self-pay

## 2021-02-14 DIAGNOSIS — R4701 Aphasia: Secondary | ICD-10-CM | POA: Diagnosis not present

## 2021-02-14 NOTE — Therapy (Signed)
[facility] [address] [place], [place], [address] Phone: [phone]   Fax:  [phone]  Speech Language Pathology Treatment  Patient Details  Name: [name] MRN: [id number] Date of Birth: 06-Jun-1951 Referring Provider (SLP): Jay Schlichter, MD   Encounter Date: 02/14/2021   End of Session - 02/14/21 1847    Visit Number 2    Number of Visits 5    Date for SLP Re-Evaluation 03/16/21    Authorization Type Medicare    SLP Start Time 1430    SLP Stop Time  1515    SLP Time Calculation (min) 45 min    Activity Tolerance Patient tolerated treatment well           History reviewed. No pertinent past medical history.  History reviewed. No pertinent surgical history.  There were no vitals filed for this visit.   Subjective Assessment - 02/14/21 1844    Subjective "This was hard." (homework)    Patient is accompained by: Family member    Currently in Pain? No/denies             ADULT SLP TREATMENT - 02/14/21 1844      General Information   Behavior/Cognition Alert;Cooperative;Pleasant mood    Patient Positioning Upright in chair    Oral care provided N/A      Treatment Provided   Treatment provided Cognitive-Linquistic      Pain Assessment   Pain Assessment No/denies pain      Cognitive-Linquistic Treatment   Treatment focused on Aphasia;Patient/family/caregiver education    Skilled Treatment reading comprehension of phrases and sentences, matching word to phrases, introduced augmentative and alternative communication strategies, cues for providing salient features, compensatory strategies      Assessment / Recommendations / Plan   Plan Continue with current plan of care      Progression Toward Goals   Progression toward goals Progressing toward goals              SLP Short Term Goals - 02/14/21 1842      SLP SHORT TERM GOAL #1   Title Pt will completed basic level reading comprehension tasks  (phrase and sentence) with 90% acc with min assist from SLP.    Baseline 70%    Time 4    Period Weeks    Status New    Target Date 03/08/21      SLP SHORT TERM GOAL #2   Title Pt will participate in 3-way conversation by answering, asking questions, and interjecting once per turn with min prompts from SLP with use of scripts as needed.    Baseline max prompts    Time 4    Period Weeks    Status New    Target Date 03/08/21      SLP SHORT TERM GOAL #3   Title Pt will describe written words by providing at least three salient features as judged by clinician with 90% acc when provided mi/mod cues.    Baseline mod/max cues    Time 4    Period Weeks    Status New    Target Date 03/08/21      SLP SHORT TERM GOAL #4   Title Pt will orally read functional sentences and phrases with 90% acc with min assist from SLP.    Baseline 70%    Time 4    Period Weeks    Status New    Target Date 03/08/21  SLP Long Term Goals - 02/14/21 1840      SLP LONG TERM GOAL #1   Title Same as short            Plan - 02/14/21 1847    Clinical Impression Statement Pt demonstrated increased difficulty copying letters and reading short sentences. He benefited from SLP support for scanning and comprehension during reading comprehension tasks. He needed encouragement to "keep trying" as he tends to say, "I can't" fairly quickly. He was asked to complete HEP and use a model to copy from (numbers, letters, days of the week, etc). Pt may benefit from a portable communication system, however he states that he does not use computers/phones often so appeared apprehensive. Next session, have Pt complete salient feature tasks and consider using verb network strengthening treatment strategies. Continue plan of care.   Speech Therapy Frequency 1x /week    Duration 4 weeks    Treatment/Interventions Compensatory strategies;Patient/family education;SLP instruction and feedback;Compensatory techniques     Potential to Achieve Goals Fair    Potential Considerations Previous level of function   Stroke was in 2018   SLP Home Exercise Plan Pt will complete HEP as assigned to facilitate carryover of treatment strategies and techniques in home environment    Consulted and Agree with Plan of Care Patient;Family member/caregiver    Family Member Consulted Spouse           Patient will benefit from skilled therapeutic intervention in order to improve the following deficits and impairments:   Aphasia    Problem List There are no problems to display for this patient.  Thank you,  Havery Moros, CCC-SLP [phone]  [facility] 02/14/2021, 6:49 PM  [facility] [address] [place], [place], [address] Phone: [phone]   Fax:  [phone]   Name: [name] MRN: [id number] Date of Birth: 1951-10-01

## 2021-02-21 ENCOUNTER — Ambulatory Visit (HOSPITAL_COMMUNITY): Payer: Medicare Other | Attending: Neurology | Admitting: Speech Pathology

## 2021-02-21 ENCOUNTER — Other Ambulatory Visit: Payer: Self-pay

## 2021-02-21 ENCOUNTER — Encounter (HOSPITAL_COMMUNITY): Payer: Self-pay | Admitting: Speech Pathology

## 2021-02-21 DIAGNOSIS — R4701 Aphasia: Secondary | ICD-10-CM | POA: Diagnosis not present

## 2021-02-21 NOTE — Therapy (Signed)
[facility] [address] [place], [place], [address] Phone: [phone]   Fax:  [phone]  Speech Language Pathology Treatment  Patient Details  Name: [name] MRN: [id number] Date of Birth: Dec 22, 1951 Referring Provider (SLP): Jay Schlichter, MD   Encounter Date: 02/21/2021   End of Session - 02/21/21 1523    Visit Number 3    Number of Visits 5    Date for SLP Re-Evaluation 03/16/21    Authorization Type Medicare    SLP Start Time 1430    SLP Stop Time  1515    SLP Time Calculation (min) 45 min    Activity Tolerance Patient tolerated treatment well           History reviewed. No pertinent past medical history.  History reviewed. No pertinent surgical history.  There were no vitals filed for this visit.   Subjective Assessment - 02/21/21 1427    Subjective "I'm sorry."    Currently in Pain? No/denies            ADULT SLP TREATMENT - 02/21/21 1522      General Information   Behavior/Cognition Alert;Cooperative;Pleasant mood    Patient Positioning Upright in chair    Oral care provided N/A      Treatment Provided   Treatment provided Cognitive-Linquistic      Pain Assessment   Pain Assessment No/denies pain      Cognitive-Linquistic Treatment   Treatment focused on Aphasia;Patient/family/caregiver education    Skilled Treatment reading comprehension of phrases and sentences, matching word to phrases, introduced augmentative and alternative communication strategies, cues for providing salient features, compensatory strategies      Assessment / Recommendations / Plan   Plan Continue with current plan of care              SLP Short Term Goals - 02/21/21 1529      SLP SHORT TERM GOAL #1   Title Pt will completed basic level reading comprehension tasks (phrase and sentence) with 90% acc with min assist from SLP.    Baseline 70%    Time 4    Period Weeks    Status New    Target Date 03/08/21       SLP SHORT TERM GOAL #2   Title Pt will participate in 3-way conversation by answering, asking questions, and interjecting once per turn with min prompts from SLP with use of scripts as needed.    Baseline max prompts    Time 4    Period Weeks    Status New    Target Date 03/08/21      SLP SHORT TERM GOAL #3   Title Pt will describe written words by providing at least three salient features as judged by clinician with 90% acc when provided mi/mod cues.    Baseline mod/max cues    Time 4    Period Weeks    Status New    Target Date 03/08/21      SLP SHORT TERM GOAL #4   Title Pt will orally read functional sentences and phrases with 90% acc with min assist from SLP.    Baseline 70%    Time 4    Period Weeks    Status New    Target Date 03/08/21            SLP Long Term Goals - 02/14/21 1840      SLP LONG TERM GOAL #1   Title Same as short  Plan - 02/21/21 1528    Clinical Impression Statement Pt completed HEP which consisted of daily written copying task of days of the week, numbers, alphabet, and functional words. In session, SLP proved scaffolding cues to increase mean length of utterances when completing sentences. He initially required max cues, which decreased to moderate cues with repetition. SLP showed Pt and his wife the [place] communication system to see if it might be something he is interested in using. They will discuss before our next visit. Continue plan of care.    Speech Therapy Frequency 1x /week    Duration 4 weeks    Treatment/Interventions Compensatory strategies;Patient/family education;SLP instruction and feedback;Compensatory techniques    Potential to Achieve Goals Fair    Potential Considerations Previous level of function   Stroke was in 2018   SLP Home Exercise Plan Pt will complete HEP as assigned to facilitate carryover of treatment strategies and techniques in home environment    Consulted and Agree with Plan of Care  Patient;Family member/caregiver    Family Member Consulted Spouse           Patient will benefit from skilled therapeutic intervention in order to improve the following deficits and impairments:   Aphasia    Problem List There are no problems to display for this patient.   [name] 02/21/2021, 3:30 PM  [place] [facility] [address] [place], [place], [address] Phone: [phone]   Fax:  [phone]   Name: [name] MRN: [id number] Date of Birth: [birth year]

## 2021-02-28 ENCOUNTER — Other Ambulatory Visit: Payer: Self-pay

## 2021-02-28 ENCOUNTER — Encounter (HOSPITAL_COMMUNITY): Payer: Self-pay | Admitting: Speech Pathology

## 2021-02-28 ENCOUNTER — Ambulatory Visit (HOSPITAL_COMMUNITY): Payer: Medicare Other | Admitting: Speech Pathology

## 2021-02-28 DIAGNOSIS — R4701 Aphasia: Secondary | ICD-10-CM | POA: Diagnosis not present

## 2021-02-28 NOTE — Therapy (Signed)
[facility] [address] [place], [place], [address] Phone: [phone]   Fax:  [phone]  Speech Language Pathology Treatment  Patient Details  Name: [name] MRN: [id number] Date of Birth: 03/05/1951 Referring Provider (SLP): Jay Schlichter, MD   Encounter Date: 02/28/2021   End of Session - 02/28/21 1705    Visit Number 4    Number of Visits 5    Date for SLP Re-Evaluation 03/16/21    Authorization Type Medicare    SLP Start Time 1430    SLP Stop Time  1515    SLP Time Calculation (min) 45 min    Activity Tolerance Patient tolerated treatment well           History reviewed. No pertinent past medical history.  History reviewed. No pertinent surgical history.  There were no vitals filed for this visit.   Subjective Assessment - 02/28/21 1704    Subjective "It just takes me so long."    Currently in Pain? No/denies             SLP Education - 02/28/21 1705    Education Details Will reach out to [employer] to see if a communication device may be beneficial    Person(s) Educated Patient;Spouse    Methods Explanation    Comprehension Verbalized understanding            SLP Short Term Goals - 02/28/21 1712      SLP SHORT TERM GOAL #1   Title Pt will completed basic level reading comprehension tasks (phrase and sentence) with 90% acc with min assist from SLP.    Baseline 70%    Time 4    Period Weeks    Status On-going    Target Date 03/08/21      SLP SHORT TERM GOAL #2   Title Pt will participate in 3-way conversation by answering, asking questions, and interjecting once per turn with min prompts from SLP with use of scripts as needed.    Baseline max prompts    Time 4    Period Weeks    Status On-going    Target Date 03/08/21      SLP SHORT TERM GOAL #3   Title Pt will describe written words by providing at least three salient features as judged by clinician with 90% acc when provided mi/mod cues.     Baseline mod/max cues    Time 4    Period Weeks    Status On-going    Target Date 03/08/21      SLP SHORT TERM GOAL #4   Title Pt will orally read functional sentences and phrases with 90% acc with min assist from SLP.    Baseline 70%    Time 4    Period Weeks    Status On-going    Target Date 03/08/21            SLP Long Term Goals - 02/28/21 1712      SLP LONG TERM GOAL #1   Title Same as short            Plan - 02/28/21 1706    Clinical Impression Statement Pt orally read 4-word sentences with 92% acc with mod assist from SLP. He was cued to use the following strategies when he was unable to read a word (usually verbs): gesture the word, write the word, and/or look away and then come back to try again. He needed moderate cueing to remember to implement the  strategies, however once he made attempts to use strategies, he was successful ~75% of the time. He completed sentence length reading comprehension tasks with 100% acc with allowance for extra time. He and his wife would like to pursue augmentative communication systems so SLP will reach out to [facility] for a device trial.    Speech Therapy Frequency 1x /week    Duration 4 weeks    Treatment/Interventions Compensatory strategies;Patient/family education;SLP instruction and feedback;Compensatory techniques    Potential to Achieve Goals Fair    Potential Considerations Previous level of function   Stroke was in 2018   SLP Home Exercise Plan Pt will complete HEP as assigned to facilitate carryover of treatment strategies and techniques in home environment    Consulted and Agree with Plan of Care Patient;Family member/caregiver    Family Member Consulted Spouse           Patient will benefit from skilled therapeutic intervention in order to improve the following deficits and impairments:   Aphasia    Problem List There are no problems to display for this patient.  Thank you,  Havery Moros,  CCC-SLP [phone]  [facility] 02/28/2021, 5:13 PM  [place] [facility] [address] [place], [place], [address] Phone: [phone]   Fax:  [phone]   Name: [name] MRN: [id number] Date of Birth: 1951/03/16

## 2021-03-07 ENCOUNTER — Ambulatory Visit (HOSPITAL_COMMUNITY): Payer: Medicare Other | Admitting: Speech Pathology

## 2021-03-08 ENCOUNTER — Encounter (HOSPITAL_COMMUNITY): Payer: Self-pay | Admitting: Speech Pathology

## 2021-03-08 ENCOUNTER — Other Ambulatory Visit: Payer: Self-pay

## 2021-03-08 ENCOUNTER — Ambulatory Visit (HOSPITAL_COMMUNITY): Payer: Medicare Other | Admitting: Speech Pathology

## 2021-03-08 DIAGNOSIS — R4701 Aphasia: Secondary | ICD-10-CM | POA: Diagnosis not present

## 2021-03-08 NOTE — Therapy (Signed)
[facility] [address] [place], [place], [address] Phone: [phone]   Fax:  [phone]  Speech Language Pathology Treatment  Patient Details  Name: [name] MRN: [id number] Date of Birth: May 02, 1951 Referring Provider (SLP): Jay Schlichter, MD   Encounter Date: 03/08/2021   End of Session - 03/08/21 1244    Visit Number 5    Number of Visits 5    Date for SLP Re-Evaluation 03/16/21    Authorization Type Medicare    SLP Start Time 1030    SLP Stop Time  1115    SLP Time Calculation (min) 45 min    Activity Tolerance Patient tolerated treatment well           History reviewed. No pertinent past medical history.  History reviewed. No pertinent surgical history.  There were no vitals filed for this visit.   Subjective Assessment - 03/08/21 1114    Subjective "I just don't know anything."    Patient is accompained by: Family member    Currently in Pain? No/denies             SLP Evaluation [facility] - 03/08/21 1114      General Information   HPI 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 He had extensive outpatient SLP therapy to address aphasia and was last discharged from outpatient SLP therapy 08/2018. The Pt feels that his ability to verbally express himself has become worse during the last several months, possibly due to COVID isolation.              ADULT SLP TREATMENT - 03/08/21 1243      General Information   Behavior/Cognition Alert;Cooperative;Pleasant mood    Patient Positioning Upright in chair    Oral care provided N/A    HPI 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 He had extensive outpatient SLP therapy to address aphasia and was last discharged from outpatient SLP therapy 08/2018. The Pt feels that his ability to verbally express himself  has become worse during the last several months, possibly due to COVID isolation.      Treatment Provided   Treatment provided Cognitive-Linquistic      Pain Assessment   Pain Assessment No/denies pain      Cognitive-Linquistic Treatment   Treatment focused on Aphasia;Patient/family/caregiver education    Skilled Treatment reading comprehension of phrases and sentences, matching word to phrases, introduced augmentative and alternative communication strategies, cues for providing salient features, compensatory strategies      Assessment / Recommendations / Plan   Plan Continue with current plan of care      Progression Toward Goals   Progression toward goals Progressing toward goals              SLP Short Term Goals - 03/08/21 1253      SLP SHORT TERM GOAL #1   Title Pt will completed basic level reading comprehension tasks (phrase and sentence) with 90% acc with min assist from SLP.    Baseline 70%    Time 4    Period Weeks    Status Achieved    Target Date 03/08/21      SLP SHORT TERM GOAL #2   Title Pt will participate in 3-way conversation by answering, asking questions, and interjecting once per turn with min prompts from  SLP with use of scripts as needed.    Baseline max prompts    Time 4    Period Weeks    Status On-going    Target Date 04/10/21      SLP SHORT TERM GOAL #3   Title Pt will describe written words by providing at least three salient features as judged by clinician with 90% acc when provided mi/mod cues.    Baseline mod/max cues    Time 4    Period Weeks    Status On-going    Target Date 04/10/21      SLP SHORT TERM GOAL #4   Title Pt will orally read functional sentences and phrases with 90% acc with min assist from SLP.    Baseline 70%    Time 4    Period Weeks    Status Achieved    Target Date 04/10/21      SLP SHORT TERM GOAL #5   Title Pt will participate in Ling[employer] communication device trial for 2 weeks with reported daily use of  at least 20 minutes per day as tracked by Pt/caregiver    Baseline Device will be new to him    Time 4    Period Weeks    Status New    Target Date 04/10/21            SLP Long Term Goals - 02/28/21 1712      SLP LONG TERM GOAL #1   Title Same as short            Plan - 03/08/21 1245    Clinical Impression Statement Pt completed basic level phrase reading comprehension tasks with 100% acc with allowance for extra time and cues for error awareness. He independently used the strategy to look away when he was "stuck" three times in the session. He participated in a conversation regaring personally relevent experiences (went to a neighbor's house to help fix his tractor) with SLP cues to have Pt provide answers to Who, What, Where, When, and Why. Pt tends to use non descript language "He", "they" and needs prompts to state the actual name of the person. He continues to become easily frustrated and states, "I can't do anything" and needs encouragement to give himself some grace. SLP sent insurance information to [employer] and we will pursue a trial with one of their devices when I hear back from them. Will request another 4 weeks of therapy and plan to incorporate additional visits with the Lingraphica.    Speech Therapy Frequency 1x /week    Duration 4 weeks    Treatment/Interventions Compensatory strategies;Patient/family education;SLP instruction and feedback;Compensatory techniques    Potential to Achieve Goals Fair    Potential Considerations Previous level of function   Stroke was in 2018   SLP Home Exercise Plan Pt will complete HEP as assigned to facilitate carryover of treatment strategies and techniques in home environment    Consulted and Agree with Plan of Care Patient;Family member/caregiver    Family Member Consulted Spouse           Patient will benefit from skilled therapeutic intervention in order to improve the following deficits and impairments:    Aphasia    Problem List There are no problems to display for this patient.  Thank you,  [name], CCC-SLP [phone]   [facility] 03/08/2021, 12:57 PM  [facility] [address] [place], [place], [address] Phone: [phone]   Fax:  [phone]  Name: Filimon Miranda MRN: [id number] Date of Birth: 1951/04/18

## 2021-03-14 ENCOUNTER — Ambulatory Visit (HOSPITAL_COMMUNITY): Payer: Medicare Other | Admitting: Speech Pathology

## 2021-03-15 ENCOUNTER — Other Ambulatory Visit: Payer: Self-pay

## 2021-03-15 ENCOUNTER — Encounter (HOSPITAL_COMMUNITY): Payer: Self-pay | Admitting: Speech Pathology

## 2021-03-15 ENCOUNTER — Ambulatory Visit (HOSPITAL_COMMUNITY): Payer: Medicare Other | Admitting: Speech Pathology

## 2021-03-15 DIAGNOSIS — R4701 Aphasia: Secondary | ICD-10-CM | POA: Diagnosis not present

## 2021-03-15 NOTE — Therapy (Signed)
[facility] [address] [place], [place], [address] Phone: [phone]   Fax:  [phone]  Speech Language Pathology Treatment  Patient Details  Name: [name] MRN: [id number] Date of Birth: 1951-02-27 Referring Provider (SLP): Jay Schlichter, MD   Encounter Date: 03/15/2021   End of Session - 03/15/21 1258    Visit Number 6    Number of Visits 9    Date for SLP Re-Evaluation 04/10/21    Authorization Type Medicare    SLP Start Time 1030    SLP Stop Time  1115    SLP Time Calculation (min) 45 min    Activity Tolerance Patient tolerated treatment well           History reviewed. No pertinent past medical history.  History reviewed. No pertinent surgical history.  There were no vitals filed for this visit.   Subjective Assessment - 03/15/21 1254    Subjective "So this isn't working, right?" In regards to [employer] computer    Patient is accompained by: Family member    Currently in Pain? 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 He had extensive outpatient SLP therapy to address aphasia and was last discharged from outpatient SLP therapy 08/2018. The Pt feels that his ability to verbally express himself has become worse during the last several months, possibly due to COVID isolation.      Treatment Provided   Treatment provided Cognitive-Linquistic      Pain Assessment   Pain Assessment No/denies pain      Cognitive-Linquistic Treatment   Treatment focused on Aphasia;Patient/family/caregiver education    Skilled Treatment reading comprehension of phrases and sentences, matching word to phrases, introduced augmentative and  alternative communication strategies, cues for providing salient features, compensatory strategies      Assessment / Recommendations / Plan   Plan Continue with current plan of care      Progression Toward Goals   Progression toward goals Progressing toward goals            SLP Education - 03/15/21 1256    Education Details Pt took [employer] home today for trial    Person(s) Educated Patient;Spouse    Methods Explanation    Comprehension Need further instruction            SLP Short Term Goals - 03/15/21 1307      SLP SHORT TERM GOAL #1   Title Pt will completed basic level reading comprehension tasks (phrase and sentence) with 90% acc with min assist from SLP.    Baseline 70%    Time 4    Period Weeks    Status Achieved    Target Date 03/08/21      SLP SHORT TERM GOAL #2   Title Pt will participate in 3-way conversation by answering, asking questions, and interjecting once per turn with min prompts from SLP with use of scripts as needed.    Baseline max prompts    Time 4    Period Weeks    Status On-going    Target Date 04/10/21      SLP SHORT TERM GOAL #  3   Title Pt will describe written words by providing at least three salient features as judged by clinician with 90% acc when provided mi/mod cues.    Baseline mod/max cues    Time 4    Period Weeks    Status On-going    Target Date 04/10/21      SLP SHORT TERM GOAL #4   Title Pt will orally read functional sentences and phrases with 90% acc with min assist from SLP.    Baseline 70%    Time 4    Period Weeks    Status Achieved    Target Date 04/10/21      SLP SHORT TERM GOAL #5   Title Pt will participate in [employer] communication device trial for 2 weeks with reported daily use of at least 20 minutes per day as tracked by Pt/caregiver    Baseline Device will be new to him    Time 4    Period Weeks    Status On-going    Target Date 04/10/21            SLP Long Term Goals - 02/28/21  1712      SLP LONG TERM GOAL #1   Title Same as short            Plan - 03/15/21 1259    Clinical Impression Statement SLP introduced the Lingraphica laptop today. Pt has limited experience with computers so he required cues for navigation. Pt was able to verbalize personal information when responding to SLP "wh" questions and was able to locate the responses on the screen via touch screen. He required max cues to navigate the screen during therapy activities on the Lingraphica and appeared frustrated with the repetitions required to complete. SLP encouraged Pt and spouse to take the device home and "look around" on it and complete more of the therapy activities together. He will be out of town next week so will miss therapy. He may be better off using an aphasia treatment application on an Ipad at home, but we will continue efforts with the [employer]raphica.    Speech Therapy Frequency 1x /week    Duration 4 weeks    Treatment/Interventions Compensatory strategies;Patient/family education;SLP instruction and feedback;Compensatory techniques    Potential to Achieve Goals Fair    Potential Considerations Previous level of function   Stroke was in 2018   SLP Home Exercise Plan Pt will complete HEP as assigned to facilitate carryover of treatment strategies and techniques in home environment    Consulted and Agree with Plan of Care Patient;Family member/caregiver    Family Member Consulted Spouse           Patient will benefit from skilled therapeutic intervention in order to improve the following deficits and impairments:   Aphasia    Problem List There are no problems to display for this patient.  Thank you,  Havery Moros, CCC-SLP [phone]  Havery Moros 03/15/2021, 1:08 PM  [facility] [address] [place], [place], [address] Phone: [phone]   Fax:  [phone]   Name: [name] MRN: [id number] Date of Birth: 1951-07-07

## 2021-03-28 ENCOUNTER — Ambulatory Visit (HOSPITAL_COMMUNITY): Payer: Medicare Other | Attending: Neurology | Admitting: Speech Pathology

## 2021-03-28 ENCOUNTER — Encounter (HOSPITAL_COMMUNITY): Payer: Self-pay | Admitting: Speech Pathology

## 2021-03-28 ENCOUNTER — Other Ambulatory Visit: Payer: Self-pay

## 2021-03-28 DIAGNOSIS — R4701 Aphasia: Secondary | ICD-10-CM | POA: Diagnosis present

## 2021-03-28 NOTE — Therapy (Signed)
[facility] [address] [place], [place], [address] Phone: [phone]   Fax:  [phone]  Speech Language Pathology Treatment  Patient Details  Name: [name] MRN: [id number] Date of Birth: [birth year] Referring Provider (SLP): Jay Schlichter, MD   Encounter Date: 03/28/2021   End of Session - 03/28/21 1630    Visit Number 7    Number of Visits 9    Date for SLP Re-Evaluation 04/10/21    Authorization Type Medicare    SLP Start Time 1528    SLP Stop Time  1612    SLP Time Calculation (min) 44 min    Activity Tolerance Patient tolerated treatment well           History reviewed. No pertinent past medical history.  History reviewed. No pertinent surgical history.  There were no vitals filed for this visit.   Subjective Assessment - 03/28/21 1629    Subjective "I just can't remember anything."    Currently in Pain? No/denies             ADULT SLP TREATMENT - 03/28/21 1628      General Information   Behavior/Cognition Alert;Cooperative;Pleasant mood    Patient Positioning Upright in chair    Oral care provided N/A    HPI 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 He had extensive outpatient SLP therapy to address aphasia and was last discharged from outpatient SLP therapy 08/2018. The Pt feels that his ability to verbally express himself has become worse during the last several months, possibly due to COVID isolation.      Treatment Provided   Treatment provided Cognitive-Linquistic      Pain Assessment   Pain Assessment No/denies pain      Cognitive-Linquistic Treatment   Treatment focused on Aphasia;Patient/family/caregiver education    Skilled Treatment reading comprehension of phrases and sentences, matching word to phrases, introduced augmentative and alternative communication strategies, cues for providing salient features,  compensatory strategies      Assessment / Recommendations / Plan   Plan Continue with current plan of care      Progression Toward Goals   Progression toward goals Progressing toward goals            SLP Education - 03/28/21 1630    Education Details Provided template to follow to complete therapy exercises at home on [employer]    Person(s) Educated Patient;Spouse    Methods Explanation;Demonstration;Handout    Comprehension Verbalized understanding            SLP Short Term Goals - 03/28/21 1632      SLP SHORT TERM GOAL #1   Title Pt will completed basic level reading comprehension tasks (phrase and sentence) with 90% acc with min assist from SLP.    Baseline 70%    Time 4    Period Weeks    Status Achieved    Target Date 03/08/21      SLP SHORT TERM GOAL #2   Title Pt will participate in 3-way conversation by answering, asking questions, and interjecting once per turn with min prompts from SLP with use of scripts as needed.    Baseline max prompts    Time 4    Period Weeks    Status On-going    Target Date 04/10/21      SLP SHORT TERM GOAL #3   Title Pt will describe written words by  providing at least three salient features as judged by clinician with 90% acc when provided mi/mod cues.    Baseline mod/max cues    Time 4    Period Weeks    Status On-going    Target Date 04/10/21      SLP SHORT TERM GOAL #4   Title Pt will orally read functional sentences and phrases with 90% acc with min assist from SLP.    Baseline 70%    Time 4    Period Weeks    Status Achieved    Target Date 04/10/21      SLP SHORT TERM GOAL #5   Title Pt will participate in [employer] communication device trial for 2 weeks with reported daily use of at least 20 minutes per day as tracked by Pt/caregiver    Baseline Device will be new to him    Time 4    Period Weeks    Status On-going    Target Date 04/10/21            SLP Long Term Goals - 02/28/21 1712      SLP LONG  TERM GOAL #1   Title Same as short            Plan - 03/28/21 1632    Clinical Impression Statement Continued trial with Lingraphica communication device in session today. Pt appears most interested in using the therapy mode on the device to practice reading comprehension skills at home. He completed word to picture ID with 100% acc with min assist for navigation of the system. Pt described an event in which he was helping mow the lawn at his church and was unable to express that he needed help lowering the blades. During a role play situation involving the same content, Pt used multimodality communication strategies to communicate his thoughts. We further discussed trying to create "scripts" of typical phrases for him to practice in preparation for community outings. Pt and spouse to practice using the Lingraphica device at home. SLP sent additional information to help customize the device.   Speech Therapy Frequency 1x /week    Duration 4 weeks    Treatment/Interventions Compensatory strategies;Patient/family education;SLP instruction and feedback;Compensatory techniques    Potential to Achieve Goals Fair    Potential Considerations Previous level of function   Stroke was in 2018   SLP Home Exercise Plan Pt will complete HEP as assigned to facilitate carryover of treatment strategies and techniques in home environment    Consulted and Agree with Plan of Care Patient;Family member/caregiver    Family Member Consulted Spouse           Patient will benefit from skilled therapeutic intervention in order to improve the following deficits and impairments:   Aphasia    Problem List There are no problems to display for this patient.  Thank you,  Havery Moros, CCC-SLP [phone]  [facility] 03/28/2021, 4:32 PM  [facility] [address] [place], [place], [address] Phone: [phone]   Fax:  [phone]   Name: [name] MRN:  [id number] Date of Birth: June 13, 1951

## 2021-04-04 ENCOUNTER — Ambulatory Visit (HOSPITAL_COMMUNITY): Payer: Medicare Other | Admitting: Speech Pathology

## 2021-04-04 ENCOUNTER — Encounter (HOSPITAL_COMMUNITY): Payer: Self-pay | Admitting: Speech Pathology

## 2021-04-04 ENCOUNTER — Other Ambulatory Visit: Payer: Self-pay

## 2021-04-04 DIAGNOSIS — R4701 Aphasia: Secondary | ICD-10-CM

## 2021-04-04 NOTE — Therapy (Signed)
[facility] [address] [place], [place], [address] Phone: [phone]   Fax:  [phone]  Speech Language Pathology Treatment  Patient Details  Name: [name] MRN: [id number] Date of Birth: [birth year] Referring Provider (SLP): Jay Schlichter, MD   Encounter Date: 04/04/2021   End of Session - 04/04/21 1732    Visit Number 8    Number of Visits 9    Date for SLP Re-Evaluation 04/10/21    Authorization Type Medicare    SLP Start Time 1520    SLP Stop Time  1610    SLP Time Calculation (min) 50 min    Activity Tolerance Patient tolerated treatment well           History reviewed. No pertinent past medical history.  History reviewed. No pertinent surgical history.  There were no vitals filed for this visit.   Subjective Assessment - 04/04/21 1725    Subjective "I don't know if this is working."    Patient is accompained by: Family member    Currently in Pain? 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 He had extensive outpatient SLP therapy to address aphasia and was last discharged from outpatient SLP therapy 08/2018. The Pt feels that his ability to verbally express himself has become worse during the last several months, possibly due to COVID isolation.      Treatment Provided   Treatment provided Cognitive-Linquistic      Pain Assessment   Pain Assessment No/denies pain      Cognitive-Linquistic Treatment   Treatment focused on Aphasia;Patient/family/caregiver education    Skilled Treatment auditory and reading comprehension tasks with use of Lingraphica and iPad, SLP guidance to navigate computers, and answering open  ended questions      Assessment / Recommendations / Plan   Plan Continue with current plan of care      Progression Toward Goals   Progression toward goals Progressing toward goals              SLP Short Term Goals - 04/04/21 1732      SLP SHORT TERM GOAL #1   Title Pt will completed basic level reading comprehension tasks (phrase and sentence) with 90% acc with min assist from SLP.    Baseline 70%    Time 4    Period Weeks    Status Achieved    Target Date 03/08/21      SLP SHORT TERM GOAL #2   Title Pt will participate in 3-way conversation by answering, asking questions, and interjecting once per turn with min prompts from SLP with use of scripts as needed.    Baseline max prompts    Time 4    Period Weeks    Status On-going    Target Date 04/10/21      SLP SHORT TERM GOAL #3   Title Pt will describe written words by providing at least three salient features as judged by clinician with 90% acc when provided mi/mod cues.    Baseline mod/max cues    Time 4    Period Weeks    Status On-going  Target Date 04/10/21      SLP SHORT TERM GOAL #4   Title Pt will orally read functional sentences and phrases with 90% acc with min assist from SLP.    Baseline 70%    Time 4    Period Weeks    Status Achieved    Target Date 04/10/21      SLP SHORT TERM GOAL #5   Title Pt will participate in [employer] communication device trial for 2 weeks with reported daily use of at least 20 minutes per day as tracked by Pt/caregiver    Baseline Device will be new to him    Time 4    Period Weeks    Status On-going    Target Date 04/10/21            SLP Long Term Goals - 04/04/21 1733      SLP LONG TERM GOAL #1   Title Same as short            Plan - 04/04/21 1732    Clinical Impression Statement Continued trial with [employer]raphica communication device in session today. Pt with minimal use at home per family report. He continues to use it primarily in the therapy mode. He  needs mod assist for navigation of the computer. SLP assisted Pt and spouse with set up of Constant Therapy trial on his iPad. He was encouraged to complete twice daily for the two week trial, while also utilizing the [employer]ica device. He needs mod/max cues initially for starting a new activity on the devices, but cues fade to min once he has completed a few trials. Next session, also plan to add more script training.   Speech Therapy Frequency 1x /week    Duration 4 weeks    Treatment/Interventions Compensatory strategies;Patient/family education;SLP instruction and feedback;Compensatory techniques    Potential to Achieve Goals Fair    Potential Considerations Previous level of function   Stroke was in 2018   SLP Home Exercise Plan Pt will complete HEP as assigned to facilitate carryover of treatment strategies and techniques in home environment    Consulted and Agree with Plan of Care Patient;Family member/caregiver    Family Member Consulted Spouse           Patient will benefit from skilled therapeutic intervention in order to improve the following deficits and impairments:   Aphasia    Problem List There are no problems to display for this patient.  Thank you,  Havery Moros, CCC-SLP [phone]  [facility] 04/04/2021, 5:33 PM  [facility] [address] [place], [place], [address] Phone: [phone]   Fax:  [phone]   Name: [name] MRN: [id number] Date of Birth: [birth year]

## 2021-04-07 ENCOUNTER — Telehealth (HOSPITAL_COMMUNITY): Payer: Self-pay | Admitting: Speech Pathology

## 2021-04-07 NOTE — Telephone Encounter (Signed)
pt cancelled appt for 4/19 because they will be out of town

## 2021-04-11 ENCOUNTER — Ambulatory Visit (HOSPITAL_COMMUNITY): Payer: Medicare Other | Admitting: Speech Pathology

## 2021-04-18 ENCOUNTER — Ambulatory Visit (HOSPITAL_COMMUNITY): Payer: Medicare Other | Admitting: Speech Pathology

## 2021-04-18 ENCOUNTER — Other Ambulatory Visit: Payer: Self-pay

## 2021-04-18 ENCOUNTER — Encounter (HOSPITAL_COMMUNITY): Payer: Self-pay | Admitting: Speech Pathology

## 2021-04-18 DIAGNOSIS — R4701 Aphasia: Secondary | ICD-10-CM

## 2021-04-18 NOTE — Therapy (Signed)
[name] [address] [place], [place], [address] Phone: [phone]   Fax:  [phone]  Speech Language Pathology Treatment  Patient Details  Name: [name] MRN: [id number] Date of Birth: 1951-09-16 Referring Provider (SLP): Jay Schlichter, MD   Encounter Date: 04/18/2021   End of Session - 04/18/21 1640    Visit Number 9    Number of Visits 13    Date for SLP Re-Evaluation 05/23/21    Authorization Type Medicare    SLP Start Time 1433    SLP Stop Time  1518    SLP Time Calculation (min) 45 min    Activity Tolerance Patient tolerated treatment well           History reviewed. No pertinent past medical history.  History reviewed. No pertinent surgical history.  There were no vitals filed for this visit.   Subjective Assessment - 04/18/21 1439    Subjective "The whole computer crashed."    Currently in Pain? No/denies             ADULT SLP TREATMENT - 04/18/21 1440      General Information   Behavior/Cognition Alert;Cooperative;Pleasant mood    Patient Positioning Upright in chair    Oral care provided N/A    HPI 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 He had extensive outpatient SLP therapy to address aphasia and was last discharged from outpatient SLP therapy 08/2018. The Pt feels that his ability to verbally express himself has become worse during the last several months, possibly due to COVID isolation.      Treatment Provided   Treatment provided Cognitive-Linquistic      Pain Assessment   Pain Assessment No/denies pain      Cognitive-Linquistic Treatment   Treatment focused on Aphasia;Patient/family/caregiver education    Skilled Treatment auditory and reading comprehension tasks with use of Lingraphica and iPad, SLP guidance to navigate computers, and answering open ended questions      Assessment / Recommendations /  Plan   Plan Continue with current plan of care      Progression Toward Goals   Progression toward goals Progressing toward goals            SLP Education - 04/18/21 1639    Education Details SLP set Pt up with TalkPath therapy account to use at home from a computer    Person(s) Educated Patient;Spouse    Methods Explanation;Handout    Comprehension Verbalized understanding            SLP Short Term Goals - 04/18/21 1647      SLP SHORT TERM GOAL #1   Title Pt will completed basic level reading comprehension tasks (phrase and sentence) with 90% acc with min assist from SLP.    Baseline 70%    Time 4    Period Weeks    Status Achieved    Target Date 03/08/21      SLP SHORT TERM GOAL #2   Title Pt will participate in 3-way conversation by answering, asking questions, and interjecting once per turn with min prompts from SLP with use of scripts as needed.    Baseline max prompts    Time 4    Period Weeks    Status On-going    Target Date 05/25/21      SLP SHORT TERM GOAL #3   Title Pt will describe written words by  providing at least three salient features as judged by clinician with 90% acc when provided mi/mod cues.    Baseline mod/max cues    Time 4    Period Weeks    Status On-going    Target Date 05/25/21      SLP SHORT TERM GOAL #4   Title Pt will orally read functional sentences and phrases with 90% acc with min assist from SLP.    Baseline 70%    Time 4    Period Weeks    Status Achieved    Target Date 04/10/21      SLP SHORT TERM GOAL #5   Title Pt will participate in Lingraphica communication device trial for 2 weeks with reported daily use of at least 20 minutes per day as tracked by Pt/caregiver    Baseline Device will be new to him    Time 4    Period Weeks    Status Achieved    Target Date 04/10/21      Additional Short Term Goals   Additional Short Term Goals Yes      SLP SHORT TERM GOAL #6   Title Pt will complete language therapy execises  on the computer 3x per week via self report to assist with discharging to HEP.    Baseline 1-2x per week at this time    Time 4    Period Weeks    Status New    Target Date 05/25/21            SLP Long Term Goals - 04/18/21 1649      SLP LONG TERM GOAL #1   Title Same as short            Plan - 04/18/21 1641    Clinical Impression Statement Pt is not using the Lingraphica device for communicating with conversation partners. He is only using it to comlete exercises for language at home. He described a situation where he was unable to tell someone at the hardware store, his address. SLP provided mod cues to navigate the Lingraphica to locate the information. He was also guided to locating his license to find his address. Will plan for 4 more SLP sessions to focus on developing a good home exercise program for Pt and focus more on conversational speech with 3-party communicaters (spouse, Pt, SLP). Pt does not feel he would use an AAC system at this time.    Speech Therapy Frequency 1x /week    Duration 4 weeks    Treatment/Interventions Compensatory strategies;Patient/family education;SLP instruction and feedback;Compensatory techniques    Potential to Achieve Goals Fair    Potential Considerations Previous level of function   Stroke was in 2018   SLP Home Exercise Plan Pt will complete HEP as assigned to facilitate carryover of treatment strategies and techniques in home environment    Consulted and Agree with Plan of Care Patient;Family member/caregiver    Family Member Consulted Spouse           Patient will benefit from skilled therapeutic intervention in order to improve the following deficits and impairments:   Aphasia    Problem List There are no problems to display for this patient.  Thank you,  Havery Moros, CCC-SLP [phone]  [facility] 04/18/2021, 4:50 PM  [facility] [address] [place], [place],  [address] Phone: [phone]   Fax:  [phone]   Name: [name] MRN: [id number] Date of Birth: Mar 22, 1951

## 2021-04-25 ENCOUNTER — Ambulatory Visit (HOSPITAL_COMMUNITY): Payer: Medicare Other | Admitting: Speech Pathology

## 2021-04-26 ENCOUNTER — Ambulatory Visit (HOSPITAL_COMMUNITY): Payer: Medicare Other | Attending: Neurology | Admitting: Speech Pathology

## 2021-04-26 ENCOUNTER — Other Ambulatory Visit: Payer: Self-pay

## 2021-04-26 ENCOUNTER — Encounter (HOSPITAL_COMMUNITY): Payer: Self-pay | Admitting: Speech Pathology

## 2021-04-26 DIAGNOSIS — R4701 Aphasia: Secondary | ICD-10-CM | POA: Insufficient documentation

## 2021-04-26 NOTE — Therapy (Signed)
[facility] [address] [place], [place], [address] Phone: [phone]   Fax:  [phone]  Speech Language Pathology Treatment  Patient Details  Name: [name] MRN: [id number] Date of Birth: 11/25/1951 Referring Provider (SLP): Jay Schlichter, MD   Encounter Date: 04/26/2021   End of Session - 04/26/21 1250    Visit Number 10    Number of Visits 13    Date for SLP Re-Evaluation 05/23/21    Authorization Type Medicare    SLP Start Time 1033    SLP Stop Time  1122    SLP Time Calculation (min) 49 min    Activity Tolerance Patient tolerated treatment well           History reviewed. No pertinent past medical history.  History reviewed. No pertinent surgical history.  There were no vitals filed for this visit.   Subjective Assessment - 04/26/21 1246    Subjective "I can't do anything."    Patient is accompained by: Family member    Currently in Pain? No/denies             ADULT SLP TREATMENT - 04/26/21 1247      General Information   Behavior/Cognition Alert;Cooperative;Pleasant mood    Patient Positioning Upright in chair    Oral care provided N/A    HPI 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 He had extensive outpatient SLP therapy to address aphasia and was last discharged from outpatient SLP therapy 08/2018. The Pt feels that his ability to verbally express himself has become worse during the last several months, possibly due to COVID isolation.      Treatment Provided   Treatment provided Cognitive-Linquistic      Pain Assessment   Pain Assessment No/denies pain      Cognitive-Linquistic Treatment   Treatment focused on Aphasia;Patient/family/caregiver education    Skilled Treatment Set up trials of Tactus language apps, supported conversation with use of multimodality cues, Pt centered conversation regarding personal  interests (Psychologist, occupational)      Assessment / Recommendations / Plan   Plan Continue with current plan of care      Progression Toward Goals   Progression toward goals Progressing toward goals            SLP Education - 04/26/21 1249    Education Details Pt to complete Tactus trial on iPad at home and bring ipad and iphone to next therapy session    Person(s) Educated Patient;Spouse    Methods Explanation;Handout    Comprehension Verbalized understanding            SLP Short Term Goals - 04/26/21 1251      SLP SHORT TERM GOAL #1   Title Pt will completed basic level reading comprehension tasks (phrase and sentence) with 90% acc with min assist from SLP.    Baseline 70%    Time 4    Period Weeks    Status Achieved    Target Date 03/08/21      SLP SHORT TERM GOAL #2   Title Pt will participate in 3-way conversation by answering, asking questions, and interjecting once per turn with min prompts from SLP with use of scripts as needed.    Baseline max prompts    Time 4    Period Weeks    Status On-going    Target Date 05/25/21      SLP  SHORT TERM GOAL #3   Title Pt will describe written words by providing at least three salient features as judged by clinician with 90% acc when provided mi/mod cues.    Baseline mod/max cues    Time 4    Period Weeks    Status On-going    Target Date 05/25/21      SLP SHORT TERM GOAL #4   Title Pt will orally read functional sentences and phrases with 90% acc with min assist from SLP.    Baseline 70%    Time 4    Period Weeks    Status Achieved    Target Date 04/10/21      SLP SHORT TERM GOAL #5   Title Pt will participate in Lingraphica communication device trial for 2 weeks with reported daily use of at least 20 minutes per day as tracked by Pt/caregiver    Baseline Device will be new to him    Time 4    Period Weeks    Status Achieved    Target Date 04/10/21      SLP SHORT TERM GOAL #6   Title Pt will complete language therapy  execises on the computer 3x per week via self report to assist with discharging to HEP.    Baseline 1-2x per week at this time    Time 4    Period Weeks    Status On-going    Target Date 05/25/21            SLP Long Term Goals - 04/18/21 1649      SLP LONG TERM GOAL #1   Title Same as short            Plan - 04/26/21 1250    Clinical Impression Statement Pt brought the rest of the Lingraphica equipment to return today. Pt would like to focus on finding computer based programs that he can use at home to practice speech/language skills. He has been introduced to Tactus, Constant Therapy, and TalkPath. He has plans to possibly purchase a laptop, however he has an iPad so he will bring that in next session (along with this iPhone). He indicates that he is not tech savvy, however I think that with repetition, he will be able to use both. He required mod to mod/max cues to participate in conversation regarding repairing a scratch on a car (SLP wrote down key word for Pt to refer back to). His communication improved when he had the car and scratch in front of him. Next session, will use the ipad and iphone to facilitate communication (texting with voice to text) and find appropriate programs for him to use at home.   Speech Therapy Frequency 1x /week    Duration 4 weeks    Treatment/Interventions Compensatory strategies;Patient/family education;SLP instruction and feedback;Compensatory techniques    Potential to Achieve Goals Fair    Potential Considerations Previous level of function   Stroke was in 2018   SLP Home Exercise Plan Pt will complete HEP as assigned to facilitate carryover of treatment strategies and techniques in home environment    Consulted and Agree with Plan of Care Patient;Family member/caregiver    Family Member Consulted Spouse           Patient will benefit from skilled therapeutic intervention in order to improve the following deficits and impairments:    Aphasia    Problem List There are no problems to display for this patient.  Thank you,  Havery Moros, CCC-SLP [phone]  [name] 04/26/2021, 12:52 PM  [facility] [address] [place], [place], [address] Phone: [phone]   Fax:  [phone]   Name: [name] MRN: [id number] Date of Birth: 1951-12-15

## 2021-05-02 ENCOUNTER — Ambulatory Visit (HOSPITAL_COMMUNITY): Payer: Medicare Other | Admitting: Speech Pathology

## 2021-05-03 ENCOUNTER — Ambulatory Visit (HOSPITAL_COMMUNITY): Payer: Medicare Other | Admitting: Speech Pathology

## 2021-05-03 ENCOUNTER — Encounter (HOSPITAL_COMMUNITY): Payer: Self-pay | Admitting: Speech Pathology

## 2021-05-03 ENCOUNTER — Other Ambulatory Visit: Payer: Self-pay

## 2021-05-03 DIAGNOSIS — R4701 Aphasia: Secondary | ICD-10-CM | POA: Diagnosis not present

## 2021-05-03 NOTE — Therapy (Signed)
[facility] [address] [place], [place], [address] Phone: [phone]   Fax:  [phone]  Speech Language Pathology Treatment  Patient Details  Name: [name] MRN: [id number] Date of Birth: 1951/07/14 Referring Provider (SLP): Jay Schlichter, MD   Encounter Date: 05/03/2021   End of Session - 05/03/21 1137    Visit Number 11    Number of Visits 13    Date for SLP Re-Evaluation 05/23/21    Authorization Type Medicare    SLP Start Time 1035    SLP Stop Time  1125    SLP Time Calculation (min) 50 min    Activity Tolerance Patient tolerated treatment well           History reviewed. No pertinent past medical history.  History reviewed. No pertinent surgical history.  There were no vitals filed for this visit.   Subjective Assessment - 05/03/21 1128    Subjective "Is this right?"    Patient is accompained by: Family member    Currently in Pain? No/denies              ADULT SLP TREATMENT - 05/03/21 1128      General Information   Behavior/Cognition Alert;Cooperative;Pleasant mood    Patient Positioning Upright in chair    Oral care provided N/A    HPI 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 He had extensive outpatient SLP therapy to address aphasia and was last discharged from outpatient SLP therapy 08/2018. The Pt feels that his ability to verbally express himself has become worse during the last several months, possibly due to COVID isolation.      Treatment Provided   Treatment provided Cognitive-Linquistic      Pain Assessment   Pain Assessment No/denies pain      Cognitive-Linquistic Treatment   Treatment focused on Aphasia;Patient/family/caregiver education    Skilled Treatment Continued trials of Tactus language apps, supported conversation with use of multimodality cues, Pt centered conversation regarding personal  interests.      Assessment / Recommendations / Plan   Plan Continue with current plan of care      Progression Toward Goals   Progression toward goals Progressing toward goals            SLP Education - 05/03/21 1134    Education Details Pt will need to go to apple store or [employer] to reset Apple ID password to purchase Tactus apps    Person(s) Educated Patient;Spouse    Methods Explanation;Handout    Comprehension Verbalized understanding            SLP Short Term Goals - 05/03/21 1144      SLP SHORT TERM GOAL #1   Title Pt will completed basic level reading comprehension tasks (phrase and sentence) with 90% acc with min assist from SLP.    Baseline 70%    Time 4    Period Weeks    Status Achieved    Target Date 03/08/21      SLP SHORT TERM GOAL #2   Title Pt will participate in 3-way conversation by answering, asking questions, and interjecting once per turn with min prompts from SLP with use of scripts as needed.    Baseline max prompts    Time 4    Period Weeks    Status On-going    Target Date 05/25/21      SLP SHORT TERM  GOAL #3   Title Pt will describe written words by providing at least three salient features as judged by clinician with 90% acc when provided mi/mod cues.    Baseline mod/max cues    Time 4    Period Weeks    Status On-going    Target Date 05/25/21      SLP SHORT TERM GOAL #4   Title Pt will orally read functional sentences and phrases with 90% acc with min assist from SLP.    Baseline 70%    Time 4    Period Weeks    Status Achieved    Target Date 04/10/21      SLP SHORT TERM GOAL #5   Title Pt will participate in Lingraphica communication device trial for 2 weeks with reported daily use of at least 20 minutes per day as tracked by Pt/caregiver    Baseline Device will be new to him    Time 4    Period Weeks    Status Achieved    Target Date 04/10/21      SLP SHORT TERM GOAL #6   Title Pt will complete language therapy execises on  the computer 3x per week via self report to assist with discharging to HEP.    Baseline 1-2x per week at this time    Time 4    Period Weeks    Status On-going    Target Date 05/25/21            SLP Long Term Goals - 04/18/21 1649      SLP LONG TERM GOAL #1   Title Same as short            Plan - 05/03/21 1138    Clinical Impression Statement Pt brought in his iPad and SLP facilitated session by guiding Pt through reading comprehension activities on his iPad with the Tactus app. Pt cued to repeat the sentences aloud after they were read aloud to him to make the task more challening and also to increase his comprehension. He achieved 90-100% acc on the moderate level activities. Pt and wife will likely purchase a bundled app so that he can continue to work on this for his home program going forward. He is hesitant to select answers because he is afraid of doing things incorrectly. He needs reminders that his selections will help guide which activities come next, so mistakes are ok. Hopefully, he will recover his Apple ID before our next session and purchase the Tactus bundle so that we can develop a plan for implementation at home going forward. Pt and spouse in agreement with plan of care.    Speech Therapy Frequency 1x /week    Duration 4 weeks    Treatment/Interventions Compensatory strategies;Patient/family education;SLP instruction and feedback;Compensatory techniques    Potential to Achieve Goals Fair    Potential Considerations Previous level of function   Stroke was in 2018   SLP Home Exercise Plan Pt will complete HEP as assigned to facilitate carryover of treatment strategies and techniques in home environment    Consulted and Agree with Plan of Care Patient;Family member/caregiver    Family Member Consulted Spouse           Patient will benefit from skilled therapeutic intervention in order to improve the following deficits and impairments:   Aphasia    Problem  List There are no problems to display for this patient.  Thank you,  Havery Moros, CCC-SLP [phone]  Loraina Stauffer 05/03/2021, 11:45 AM  [facility] [address] [place], [place], [address] Phone: [phone]   Fax:  [phone]   Name: Alicia Ackert MRN: [id number] Date of Birth: 1951-03-08

## 2021-05-09 ENCOUNTER — Ambulatory Visit (HOSPITAL_COMMUNITY): Payer: Medicare Other | Admitting: Speech Pathology

## 2021-05-10 ENCOUNTER — Other Ambulatory Visit: Payer: Self-pay

## 2021-05-10 ENCOUNTER — Ambulatory Visit (HOSPITAL_COMMUNITY): Payer: Medicare Other | Admitting: Speech Pathology

## 2021-05-10 ENCOUNTER — Encounter (HOSPITAL_COMMUNITY): Payer: Self-pay | Admitting: Speech Pathology

## 2021-05-10 DIAGNOSIS — R4701 Aphasia: Secondary | ICD-10-CM

## 2021-05-10 NOTE — Therapy (Signed)
[facility] [address] [place], [place], [address] Phone: [phone]   Fax:  [phone]  Speech Language Pathology Treatment  Patient Details  Name: [name] MRN: [id number] Date of Birth: [birth year] Referring Provider (SLP): Jay Schlichter, MD   Encounter Date: 05/10/2021   End of Session - 05/10/21 1245    Visit Number 12    Number of Visits 13    Date for SLP Re-Evaluation 05/23/21    Authorization Type Medicare    SLP Start Time 1030    SLP Stop Time  1130    SLP Time Calculation (min) 60 min    Activity Tolerance Patient tolerated treatment well           History reviewed. No pertinent past medical history.  History reviewed. No pertinent surgical history.  There were no vitals filed for this visit.   Subjective Assessment - 05/10/21 1236    Subjective "I have to learn all this again?"    Patient is accompained by: Family member    Currently in Pain? 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 He had extensive outpatient SLP therapy to address aphasia and was last discharged from outpatient SLP therapy 08/2018. The Pt feels that his ability to verbally express himself has become worse during the last several months, possibly due to COVID isolation.      Treatment Provided   Treatment provided Cognitive-Linquistic      Pain Assessment   Pain Assessment No/denies pain      Cognitive-Linquistic Treatment   Treatment focused on Aphasia;Patient/family/caregiver education    Skilled Treatment SLP facilitated session by assisting Pt in set up with new iPad and navigating the device for set up of Tactus language apps.       Assessment / Recommendations / Plan   Plan Continue with current plan of care      Progression Toward Goals   Progression toward goals Progressing toward goals            SLP Education - 05/10/21 1243    Education Details Pt assigned "listening" section in Tactus Naming app for homework    Person(s) Educated Patient;Spouse    Methods Explanation;Demonstration    Comprehension Verbalized understanding            SLP Short Term Goals - 05/10/21 1253      SLP SHORT TERM GOAL #1   Title Pt will completed basic level reading comprehension tasks (phrase and sentence) with 90% acc with min assist from SLP.    Baseline 70%    Time 4    Period Weeks    Status Achieved    Target Date 03/08/21      SLP SHORT TERM GOAL #2   Title Pt will participate in 3-way conversation by answering, asking questions, and interjecting once per turn with min prompts from SLP with use of scripts as needed.    Baseline max prompts    Time 4    Period Weeks    Status On-going    Target Date 05/25/21      SLP SHORT TERM GOAL #3  Title Pt will describe written words by providing at least three salient features as judged by clinician with 90% acc when provided mi/mod cues.    Baseline mod/max cues    Time 4    Period Weeks    Status On-going    Target Date 05/25/21      SLP SHORT TERM GOAL #4   Title Pt will orally read functional sentences and phrases with 90% acc with min assist from SLP.    Baseline 70%    Time 4    Period Weeks    Status Achieved    Target Date 04/10/21      SLP SHORT TERM GOAL #5   Title Pt will participate in Lingraphica communication device trial for 2 weeks with reported daily use of at least 20 minutes per day as tracked by Pt/caregiver    Baseline Device will be new to him    Time 4    Period Weeks    Status Achieved    Target Date 04/10/21      SLP SHORT TERM GOAL #6   Title Pt will complete language therapy execises on the computer 3x per week via self report  to assist with discharging to HEP.    Baseline 1-2x per week at this time    Time 4    Period Weeks    Status On-going    Target Date 05/25/21            SLP Long Term Goals - 05/10/21 1253      SLP LONG TERM GOAL #1   Title Same as short            Plan - 05/10/21 1246    Clinical Impression Statement Pt and spouse purchased a new iPad and SLP assisted with set up this session and purchase of Tactus Language app. Pt required moderate multimodality cues to navigate the device and complete language trials. He expressed frustration that tasks were challending, however SLP reminded Pt that all new tasks are challenging and that through repetition, he improves. He completed listening task (f=4+) with 95% acc, phrase completion with 75% acc, and picture to phrase matching with 75% acc. He would benefit from several more sessions to assist in implementation of using the iPad at home to assist with communication and HEP.    Speech Therapy Frequency 1x /week    Duration 4 weeks    Treatment/Interventions Compensatory strategies;Patient/family education;SLP instruction and feedback;Compensatory techniques    Potential to Achieve Goals Fair    Potential Considerations Previous level of function   Stroke was in 2018   SLP Home Exercise Plan Pt will complete HEP as assigned to facilitate carryover of treatment strategies and techniques in home environment    Consulted and Agree with Plan of Care Patient;Family member/caregiver    Family Member Consulted Spouse           Patient will benefit from skilled therapeutic intervention in order to improve the following deficits and impairments:   Aphasia    Problem List There are no problems to display for this patient.  Thank you,  Havery Moros, CCC-SLP [phone]  [facility] 05/10/2021, 12:53 PM  [facility] [address] [place], [place], [address] Phone: [phone]   Fax:   [phone]   Name: [name] MRN: [id number] Date of Birth: 1951/03/02

## 2021-05-16 ENCOUNTER — Ambulatory Visit (HOSPITAL_COMMUNITY): Payer: Medicare Other | Admitting: Speech Pathology

## 2021-05-17 ENCOUNTER — Ambulatory Visit (HOSPITAL_COMMUNITY): Payer: Medicare Other | Admitting: Speech Pathology

## 2021-05-27 IMAGING — US US ABDOMEN COMPLETE
1 series · 14 of 25 positions shown · non-contrast
Comparison: None.

CLINICAL DATA: Generalized abdominal pain with nausea and vomiting

EXAM:
ABDOMEN ULTRASOUND COMPLETE

[Series 1: us abdomen complete · 0.17mm/px · 14 of 82 slices shown]
[im 1/82]
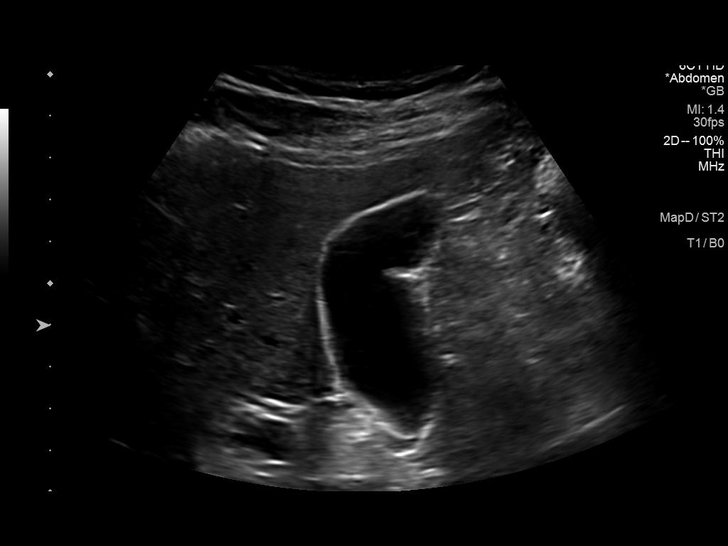
[im 7/82]
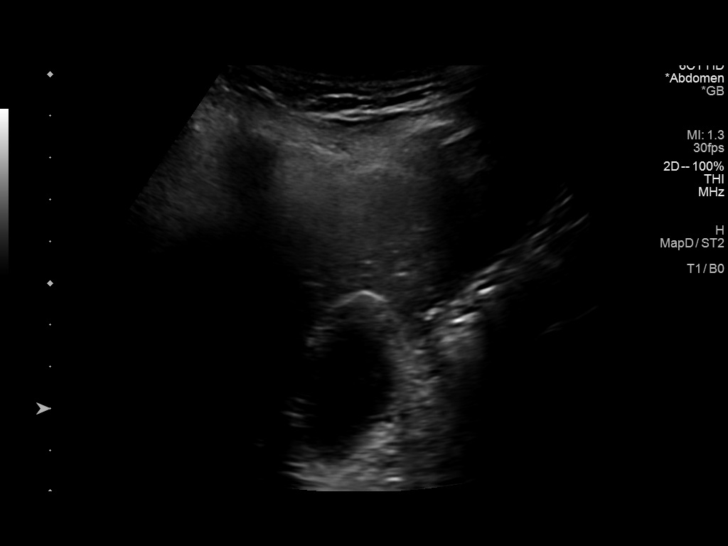
[im 14/82]
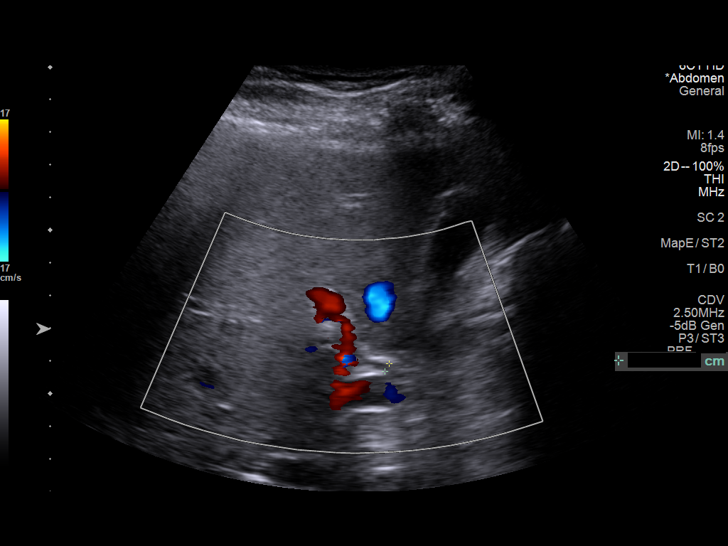
[im 21/82]
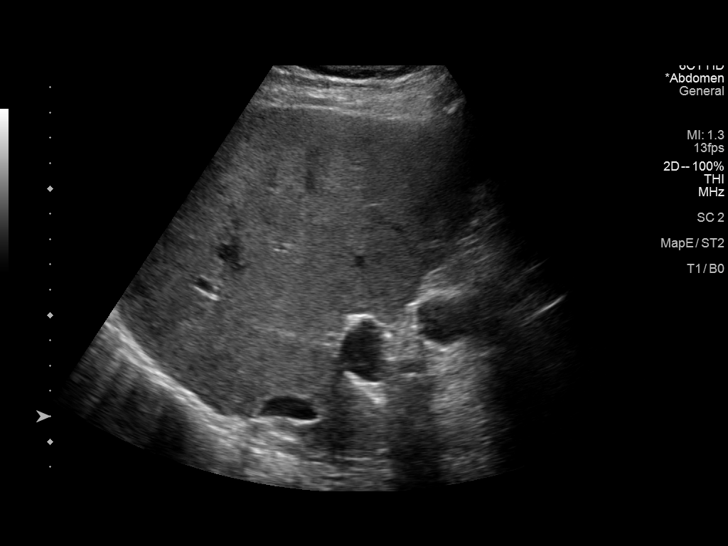
[im 28/82]
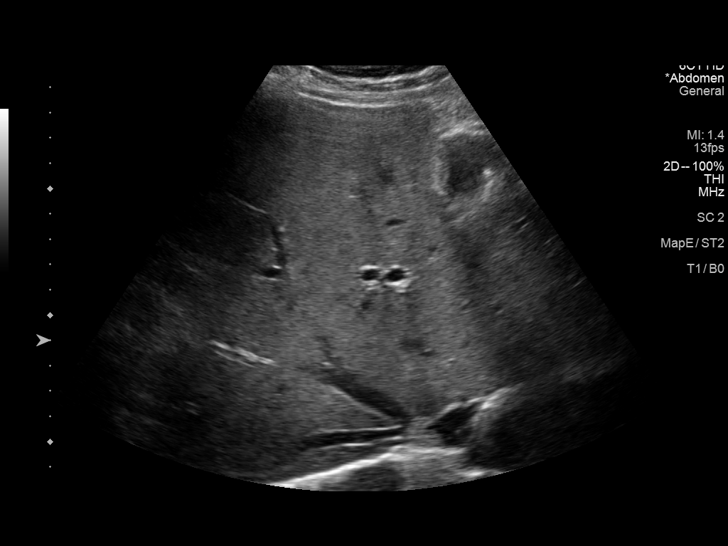
[im 31/82]
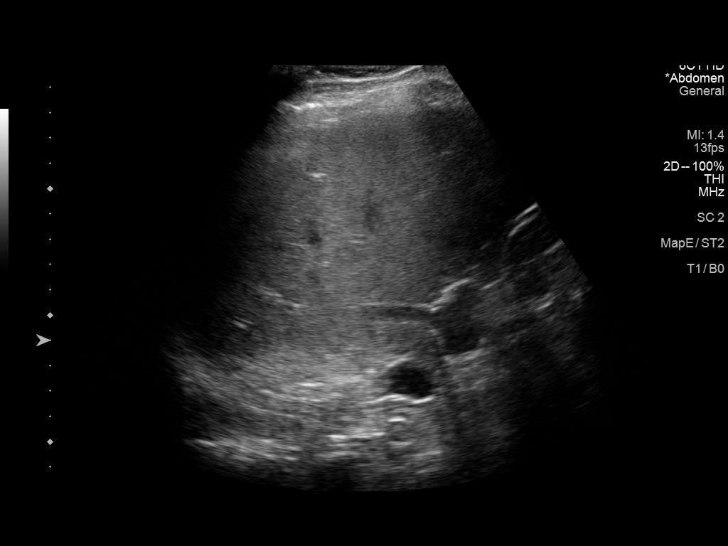
[im 38/82]
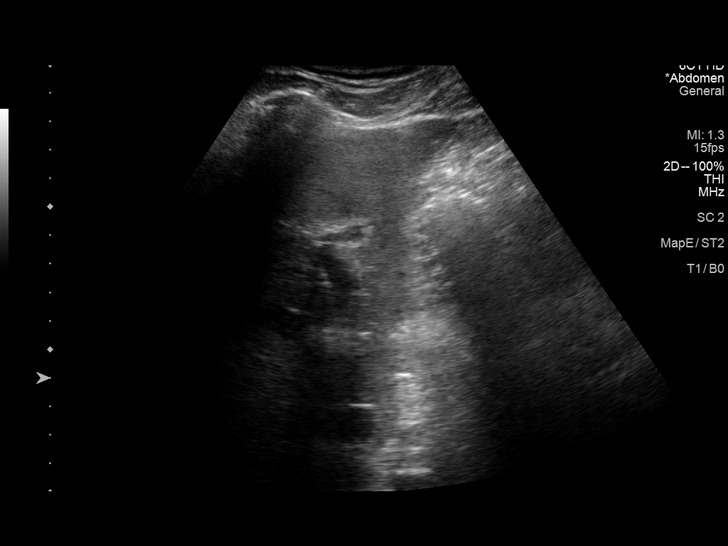
[im 44/82]
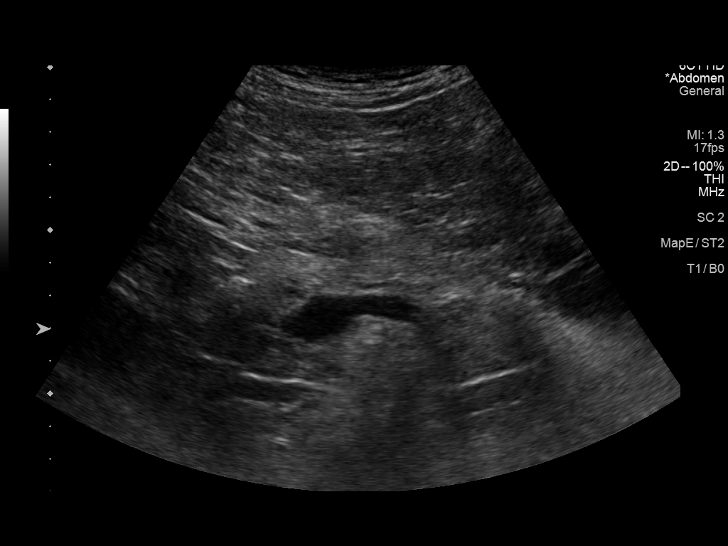
[im 51/82]
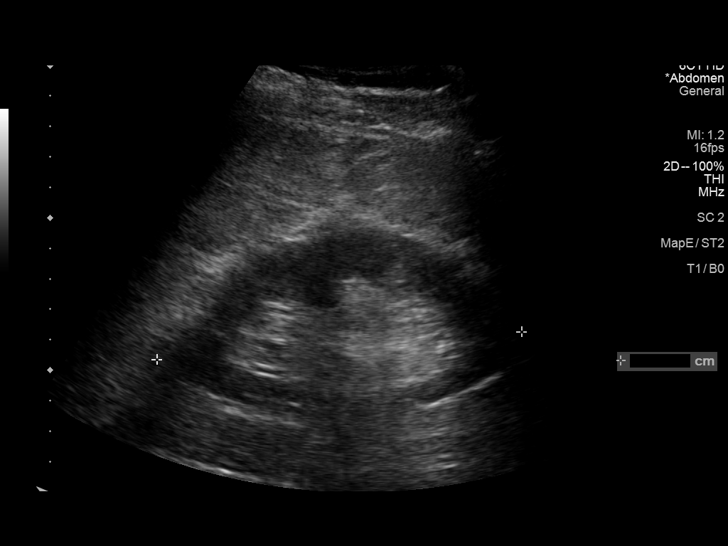
[im 55/82]
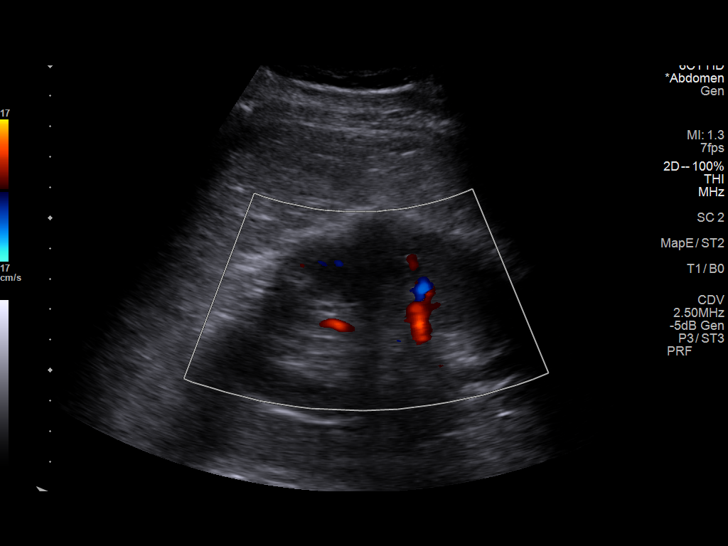
[im 61/82]
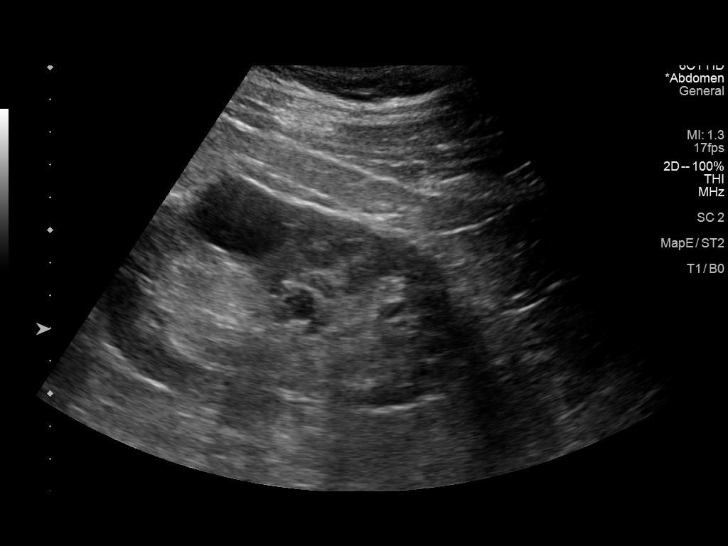
[im 68/82]
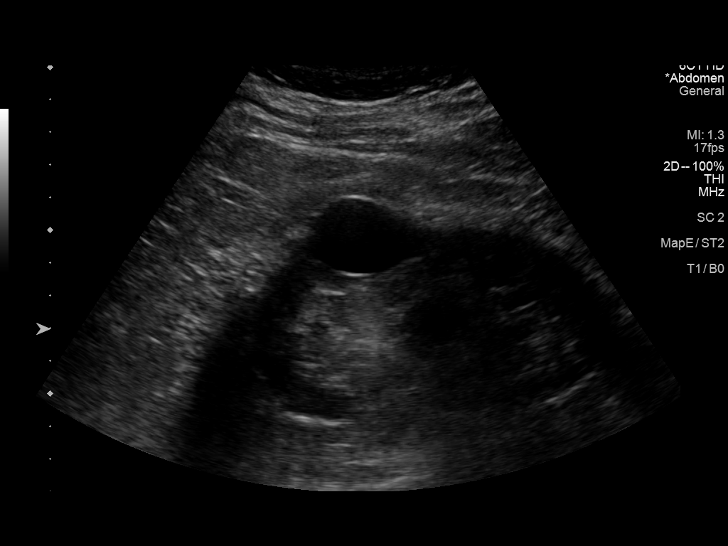
[im 75/82]
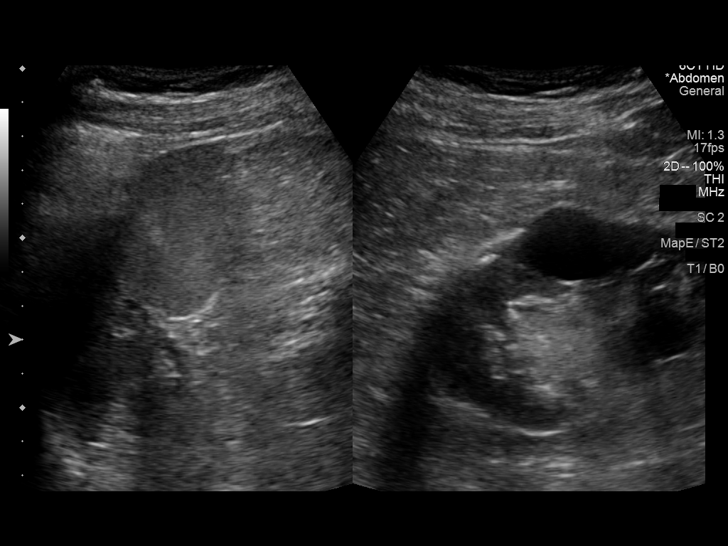
[im 82/82]
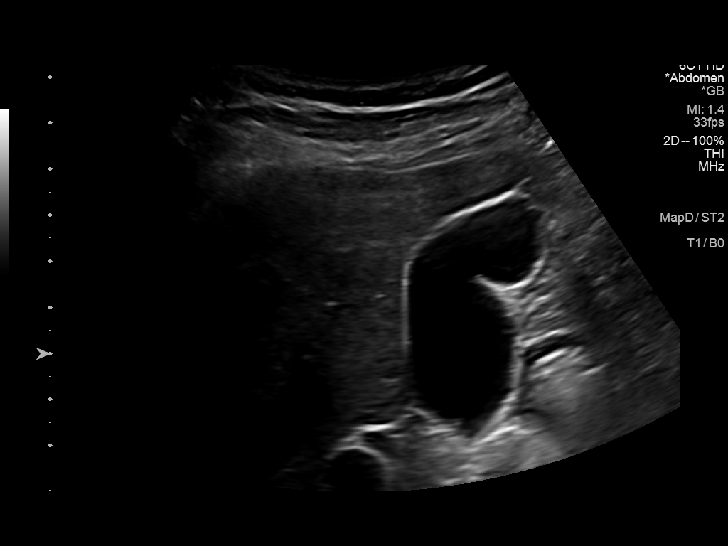

[14 of 25 positions shown; findings below may reference images not displayed]

FINDINGS: Gallbladder: No gallstones or wall thickening visualized. No
sonographic Murphy sign noted by sonographer.

Common bile duct: Diameter: 3.7 mm.

Liver: Mildly increased in echogenicity with mild heterogeneity. No
focal mass is noted. Portal vein is patent on color Doppler imaging
with normal direction of blood flow towards the liver.

IVC: No abnormality visualized.

Pancreas: Visualized portion unremarkable.

Spleen: Size and appearance within normal limits.

Right Kidney: Length: 12.0 cm. Echogenicity within normal limits.
No mass or hydronephrosis visualized.

Left Kidney: Length: 12.2 cm. Two dominant cysts are noted, one
within the cortex measuring 3.1 cm and one in the parapelvic region
measuring 1.6 cm. No mass lesion or hydronephrosis is noted.

Abdominal aorta: No aneurysm visualized.

Other findings: None.
IMPRESSION: Left renal cysts.

Fatty infiltration of the liver.

## 2021-06-01 ENCOUNTER — Encounter (HOSPITAL_COMMUNITY): Payer: Self-pay | Admitting: Speech Pathology

## 2021-06-01 ENCOUNTER — Other Ambulatory Visit: Payer: Self-pay

## 2021-06-01 ENCOUNTER — Ambulatory Visit (HOSPITAL_COMMUNITY): Payer: Medicare Other | Attending: Neurology | Admitting: Speech Pathology

## 2021-06-01 DIAGNOSIS — R4701 Aphasia: Secondary | ICD-10-CM

## 2021-06-01 NOTE — Therapy (Signed)
[facility] [address] [place], [place], [address] Phone: [phone]   Fax:  [phone]  Speech Language Pathology Treatment  Patient Details  Name: Ronell Duffus MRN: [id number] Date of Birth: 06/07/1951 Referring Provider (SLP): Jay Schlichter, MD   Encounter Date: 06/01/2021   End of Session - 06/01/21 1141     Visit Number 13    Number of Visits 16    Date for SLP Re-Evaluation 07/06/21    Authorization Type Medicare    SLP Start Time 0912    SLP Stop Time  0955    SLP Time Calculation (min) 43 min    Activity Tolerance Patient tolerated treatment well             History reviewed. No pertinent past medical history.  History reviewed. No pertinent surgical history.  There were no vitals filed for this visit.   Subjective Assessment - 06/01/21 1327     Subjective "Will I have to do this every time?" -in regards to setting up iPad    Patient is accompained by: Family member    Currently in Pain? No/denies                   ADULT SLP TREATMENT - 06/01/21 1138       General Information   Behavior/Cognition Alert;Cooperative;Pleasant mood    Patient Positioning Upright in chair    Oral care provided N/A    HPI 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 He had extensive outpatient SLP therapy to address aphasia and was last discharged from outpatient SLP therapy 08/2018. The Pt feels that his ability to verbally express himself has become worse during the last several months, possibly due to COVID isolation.      Treatment Provided   Treatment provided Cognitive-Linquistic      Pain Assessment   Pain Assessment No/denies pain      Cognitive-Linquistic Treatment   Treatment focused on Aphasia;Patient/family/caregiver education    Skilled Treatment SLP facilitated session by assisting Pt in set up and navigation of his iPad  (to set up the stand, use finger ID, locate Tactus app). Moderate verbal and visual cues provided for navigation and set up new spelling activity on app      Assessment / Recommendations / Plan   Plan Continue with current plan of care      Progression Toward Goals   Progression toward goals Progressing toward goals              SLP Education - 06/01/21 1140     Education Details Pt assigned "spelling" section in Tactus App; set up email and Facebook on his ipad    Person(s) Educated Patient    Methods Explanation;Handout    Comprehension Verbalized understanding              SLP Short Term Goals - 06/01/21 1344       SLP SHORT TERM GOAL #1   Title Pt will completed basic level reading comprehension tasks (phrase and sentence) with 90% acc with min assist from SLP.    Baseline 70%    Time 4    Period Weeks    Status Achieved    Target Date 03/08/21      SLP SHORT TERM GOAL #2   Title Pt will participate in 3-way conversation by answering, asking questions, and interjecting once per turn with  min prompts from SLP with use of scripts as needed.    Baseline max prompts    Time 4    Period Weeks    Status On-going    Target Date 07/06/21      SLP SHORT TERM GOAL #3   Title Pt will describe written words by providing at least three salient features as judged by clinician with 90% acc when provided mi/mod cues.    Baseline mod/max cues    Time 4    Period Weeks    Status On-going    Target Date 07/06/21      SLP SHORT TERM GOAL #4   Title Pt will orally read functional sentences and phrases with 90% acc with min assist from SLP.    Baseline 70%    Time 4    Period Weeks    Status Achieved    Target Date 04/10/21      SLP SHORT TERM GOAL #5   Title Pt will participate in Lingraphica communication device trial for 2 weeks with reported daily use of at least 20 minutes per day as tracked by Pt/caregiver    Baseline Device will be new to him    Time 4    Period  Weeks    Status Achieved    Target Date 04/10/21      SLP SHORT TERM GOAL #6   Title Pt will complete language therapy execises on the computer 3x per week via self report to assist with discharging to HEP.    Baseline 1-2x per week at this time    Time 4    Period Weeks    Status On-going    Target Date 07/06/21              SLP Long Term Goals - 06/01/21 1346       SLP LONG TERM GOAL #1   Title Same as short              Plan - 06/01/21 1142     Clinical Impression Statement Pt had a death in the family and was not able to complete many tasks on the Tactus app. Attendence has been sparse, however all apps have now been set up on his phone (with the exception of Facebook and email due to password written down at home). SLP reinforced Pt setting up the ipad (opening, turning on, and navigation). He will need to practice this repeately until it becomes easier for him. He continues to show signs of frustration, due to not being "tech savy" prior to his stroke. Will request 3-4 more visits to be used by 07/06/2021 with plan for discharge to home program.    Speech Therapy Frequency 1x /week    Duration 4 weeks    Treatment/Interventions Compensatory strategies;Patient/family education;SLP instruction and feedback;Compensatory techniques    Potential to Achieve Goals Fair    Potential Considerations Previous level of function   Stroke was in 2018   SLP Home Exercise Plan Pt will complete HEP as assigned to facilitate carryover of treatment strategies and techniques in home environment    Consulted and Agree with Plan of Care Patient;Family member/caregiver    Family Member Consulted Spouse             Patient will benefit from skilled therapeutic intervention in order to improve the following deficits and impairments:   Aphasia    Problem List There are no problems to display for this patient.  Thank you,  Havery Moros,  CCC-SLP [phone]  [facility] 06/01/2021, 1:46 PM  [facility] [address] [place], [place], [address] Phone: [phone]   Fax:  [phone]   Name: Marwin Primmer MRN: [id number] Date of Birth: [birth year]

## 2021-06-06 ENCOUNTER — Encounter (HOSPITAL_COMMUNITY): Payer: Self-pay | Admitting: Speech Pathology

## 2021-06-06 ENCOUNTER — Other Ambulatory Visit: Payer: Self-pay

## 2021-06-06 ENCOUNTER — Ambulatory Visit (HOSPITAL_COMMUNITY): Payer: Medicare Other | Admitting: Speech Pathology

## 2021-06-06 DIAGNOSIS — R4701 Aphasia: Secondary | ICD-10-CM | POA: Diagnosis not present

## 2021-06-06 NOTE — Therapy (Signed)
[facility] [address] [place], [place], [address] Phone: [phone]   Fax:  [phone]  Speech Language Pathology Treatment  Patient Details  Name: Safir Michalec MRN: [id number] Date of Birth: 12/17/1951 Referring Provider (SLP): Jay Schlichter, MD   Encounter Date: 06/06/2021   End of Session - 06/06/21 1701     Visit Number 14    Number of Visits 16    Date for SLP Re-Evaluation 07/06/21    Authorization Type Medicare    SLP Start Time 1600    SLP Stop Time  1649    SLP Time Calculation (min) 49 min    Activity Tolerance Patient tolerated treatment well             History reviewed. No pertinent past medical history.  History reviewed. No pertinent surgical history.  There were no vitals filed for this visit.   Subjective Assessment - 06/06/21 1700     Subjective "I can't remember anything."    Currently in Pain? No/denies                ADULT SLP TREATMENT - 06/06/21 1700       General Information   Behavior/Cognition Alert;Cooperative;Pleasant mood    Patient Positioning Upright in chair    Oral care provided N/A    HPI Mr. 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 He had extensive outpatient SLP therapy to address aphasia and was last discharged from outpatient SLP therapy 08/2018. The Pt feels that his ability to verbally express himself has become worse during the last several months, possibly due to COVID isolation.      Treatment Provided   Treatment provided Cognitive-Linquistic      Pain Assessment   Pain Assessment No/denies pain      Cognitive-Linquistic Treatment   Treatment focused on Aphasia;Patient/family/caregiver education    Skilled Treatment SLP facilitated session by assisting Pt in set up and navigation of his iPad (to set up the stand, use finger ID, locate Tactus app). Moderate verbal and visual cues  provided for navigation and set up new spelling activity on app      Assessment / Recommendations / Plan   Plan Continue with current plan of care                SLP Short Term Goals - 06/06/21 1705       SLP SHORT TERM GOAL #1   Title Pt will completed basic level reading comprehension tasks (phrase and sentence) with 90% acc with min assist from SLP.    Baseline 70%    Time 4    Period Weeks    Status Achieved    Target Date 03/08/21      SLP SHORT TERM GOAL #2   Title Pt will participate in 3-way conversation by answering, asking questions, and interjecting once per turn with min prompts from SLP with use of scripts as needed.    Baseline max prompts    Time 4    Period Weeks    Status On-going    Target Date 07/06/21      SLP SHORT TERM GOAL #3   Title Pt will describe written words by providing at least three salient features as judged by clinician with 90% acc when provided mi/mod cues.    Baseline mod/max cues    Time 4    Period Weeks  Status On-going    Target Date 07/06/21      SLP SHORT TERM GOAL #4   Title Pt will orally read functional sentences and phrases with 90% acc with min assist from SLP.    Baseline 70%    Time 4    Period Weeks    Status Achieved    Target Date 04/10/21      SLP SHORT TERM GOAL #5   Title Pt will participate in Lingraphica communication device trial for 2 weeks with reported daily use of at least 20 minutes per day as tracked by Pt/caregiver    Baseline Device will be new to him    Time 4    Period Weeks    Status Achieved    Target Date 04/10/21      SLP SHORT TERM GOAL #6   Title Pt will complete language therapy execises on the computer 3x per week via self report to assist with discharging to HEP.    Baseline 1-2x per week at this time    Time 4    Period Weeks    Status On-going    Target Date 07/06/21              SLP Long Term Goals - 06/06/21 1705       SLP LONG TERM GOAL #1   Title Same as  short              Plan - 06/06/21 1702     Clinical Impression Statement Pt attended therapy by himself today, as his wife had another appointment. He brought his iPad and iPhone and SLP assisted in setting up additional applications for him to use. He was able to navigate use of the iPad with min assist this date. He completed auditory sentence comprehension tasks on the Tactus app with 75% acc with min assist. He continues to show signs of frustration, due to not being "tech savy" prior to his stroke. Plan to d/c by 07/06/2021 with plan for discharge to home program.    Speech Therapy Frequency 1x /week    Duration 4 weeks    Treatment/Interventions Compensatory strategies;Patient/family education;SLP instruction and feedback;Compensatory techniques    Potential to Achieve Goals Fair    Potential Considerations Previous level of function   Stroke was in 2018   SLP Home Exercise Plan Pt will complete HEP as assigned to facilitate carryover of treatment strategies and techniques in home environment    Consulted and Agree with Plan of Care Patient;Family member/caregiver    Family Member Consulted Spouse             Patient will benefit from skilled therapeutic intervention in order to improve the following deficits and impairments:   Aphasia    Problem List There are no problems to display for this patient.  Thank you,  Havery Moros, CCC-SLP [phone]  [facility] 06/06/2021, 5:06 PM   [facility] [address] [place], [place], [address] Phone: [phone]   Fax:  [phone]   Name: Gurtaj Ruz MRN: [id number] Date of Birth: May 02, 1951

## 2021-06-13 ENCOUNTER — Ambulatory Visit (HOSPITAL_COMMUNITY): Payer: Medicare Other | Admitting: Speech Pathology

## 2021-06-20 ENCOUNTER — Ambulatory Visit (HOSPITAL_COMMUNITY): Payer: Medicare Other | Admitting: Speech Pathology

## 2021-06-20 ENCOUNTER — Encounter (HOSPITAL_COMMUNITY): Payer: Self-pay | Admitting: Speech Pathology

## 2021-06-20 ENCOUNTER — Other Ambulatory Visit: Payer: Self-pay

## 2021-06-20 DIAGNOSIS — R4701 Aphasia: Secondary | ICD-10-CM | POA: Diagnosis not present

## 2021-06-20 NOTE — Therapy (Signed)
[facility] [address] [place], [place], [address] Phone: [phone]   Fax:  [phone]  Speech Language Pathology Treatment  Patient Details  Name: [name] MRN: [id number] Date of Birth: 14-Oct-1951 Referring Provider (SLP): Jay Schlichter, MD   Encounter Date: 06/20/2021   End of Session - 06/20/21 1808     Visit Number 15    Number of Visits 16    Date for SLP Re-Evaluation 07/06/21    Authorization Type Medicare    SLP Start Time 1518    SLP Stop Time  1605    SLP Time Calculation (min) 47 min    Activity Tolerance Patient tolerated treatment well             History reviewed. No pertinent past medical history.  History reviewed. No pertinent surgical history.  There were no vitals filed for this visit.   Subjective Assessment - 06/20/21 1806     Subjective "I have to practice."    Patient is accompained by: Family member    Currently in Pain? No/denies                   ADULT SLP TREATMENT - 06/20/21 1807       General Information   Behavior/Cognition Alert;Cooperative;Pleasant mood    Patient Positioning Upright in chair    Oral care provided N/A    HPI 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 He had extensive outpatient SLP therapy to address aphasia and was last discharged from outpatient SLP therapy 08/2018. The Pt feels that his ability to verbally express himself has become worse during the last several months, possibly due to COVID isolation.      Treatment Provided   Treatment provided Cognitive-Linquistic      Pain Assessment   Pain Assessment No/denies pain      Cognitive-Linquistic Treatment   Treatment focused on Aphasia;Patient/family/caregiver education    Skilled Treatment SLP facilitated session by ongoing education with Pt and spouse with use of iPad and Tactus app for continued language  treatment post discharge.     Assessment / Recommendations / Plan   Plan Continue with current plan of care                SLP Short Term Goals - 06/20/21 1808       SLP SHORT TERM GOAL #1   Title Pt will completed basic level reading comprehension tasks (phrase and sentence) with 90% acc with min assist from SLP.    Baseline 70%    Time 4    Period Weeks    Status Achieved    Target Date 03/08/21      SLP SHORT TERM GOAL #2   Title Pt will participate in 3-way conversation by answering, asking questions, and interjecting once per turn with min prompts from SLP with use of scripts as needed.    Baseline max prompts    Time 4    Period Weeks    Status On-going    Target Date 07/06/21      SLP SHORT TERM GOAL #3   Title Pt will describe written words by providing at least three salient features as judged by clinician with 90% acc when provided mi/mod cues.    Baseline mod/max cues    Time 4    Period Weeks    Status On-going    Target Date  07/06/21      SLP SHORT TERM GOAL #4   Title Pt will orally read functional sentences and phrases with 90% acc with min assist from SLP.    Baseline 70%    Time 4    Period Weeks    Status Achieved    Target Date 04/10/21      SLP SHORT TERM GOAL #5   Title Pt will participate in Lingraphica communication device trial for 2 weeks with reported daily use of at least 20 minutes per day as tracked by Pt/caregiver    Baseline Device will be new to him    Time 4    Period Weeks    Status Achieved    Target Date 04/10/21      SLP SHORT TERM GOAL #6   Title Pt will complete language therapy execises on the computer 3x per week via self report to assist with discharging to HEP.    Baseline 1-2x per week at this time    Time 4    Period Weeks    Status On-going    Target Date 07/06/21              SLP Long Term Goals - 06/06/21 1705       SLP LONG TERM GOAL #1   Title Same as short              Plan - 06/20/21  1808     Clinical Impression Statement Pt was accompanied to therapy by his spouse today and they brought his iPad. Pt and spouse reports only minimal implementation of Tactus app at home. They stated that they need to "buckle down" and practice at home. They shared that he had trouble stating the letters during his vision assessment at the eye doctor. SLP provided an alternative by having Pt write/copy the letters instead of trying to say the letters aloud due to aphasia.    Speech Therapy Frequency 1x /week    Treatment/Interventions Compensatory strategies;Patient/family education;SLP instruction and feedback;Compensatory techniques    Potential to Achieve Goals Fair    Potential Considerations Previous level of function   Stroke was in 2018   SLP Home Exercise Plan Pt will complete HEP as assigned to facilitate carryover of treatment strategies and techniques in home environment    Consulted and Agree with Plan of Care Patient;Family member/caregiver    Family Member Consulted Spouse             Patient will benefit from skilled therapeutic intervention in order to improve the following deficits and impairments:   Aphasia    Problem List There are no problems to display for this patient.  Thank you,  Havery Moros, CCC-SLP [phone]  [facility] 06/20/2021, 6:09 PM  [facility] [address] [place], [place], [address] Phone: [phone]   Fax:  [phone]   Name: [name] MRN: [id number] Date of Birth: 02-Sep-1951

## 2021-06-27 ENCOUNTER — Ambulatory Visit (HOSPITAL_COMMUNITY): Payer: Self-pay | Admitting: Speech Pathology

## 2021-07-12 ENCOUNTER — Ambulatory Visit (HOSPITAL_COMMUNITY): Payer: Medicare Other | Admitting: Speech Pathology

## 2021-07-19 ENCOUNTER — Ambulatory Visit (HOSPITAL_COMMUNITY): Payer: Medicare Other | Admitting: Speech Pathology

## 2022-02-25 ENCOUNTER — Other Ambulatory Visit: Payer: Self-pay

## 2022-02-25 ENCOUNTER — Encounter (HOSPITAL_COMMUNITY): Payer: Self-pay | Admitting: *Deleted

## 2022-02-25 DIAGNOSIS — R197 Diarrhea, unspecified: Secondary | ICD-10-CM | POA: Diagnosis not present

## 2022-02-25 DIAGNOSIS — R109 Unspecified abdominal pain: Secondary | ICD-10-CM | POA: Diagnosis not present

## 2022-02-25 DIAGNOSIS — R112 Nausea with vomiting, unspecified: Secondary | ICD-10-CM | POA: Insufficient documentation

## 2022-02-25 LAB — CBC WITH DIFFERENTIAL/PLATELET
Abs Immature Granulocytes: 0.04 10*3/uL (ref 0.00–0.07)
Basophils Absolute: 0 10*3/uL (ref 0.0–0.1)
Basophils Relative: 0 %
Eosinophils Absolute: 0 10*3/uL (ref 0.0–0.5)
Eosinophils Relative: 0 %
HCT: 48.5 % (ref 39.0–52.0)
Hemoglobin: 16.8 g/dL (ref 13.0–17.0)
Immature Granulocytes: 0 %
Lymphocytes Relative: 4 %
Lymphs Abs: 0.4 10*3/uL — ABNORMAL LOW (ref 0.7–4.0)
MCH: 30.8 pg (ref 26.0–34.0)
MCHC: 34.6 g/dL (ref 30.0–36.0)
MCV: 89 fL (ref 80.0–100.0)
Monocytes Absolute: 0.5 10*3/uL (ref 0.1–1.0)
Monocytes Relative: 4 %
Neutro Abs: 11.1 10*3/uL — ABNORMAL HIGH (ref 1.7–7.7)
Neutrophils Relative %: 92 %
Platelets: 226 10*3/uL (ref 150–400)
RBC: 5.45 MIL/uL (ref 4.22–5.81)
RDW: 13.4 % (ref 11.5–15.5)
WBC: 12.1 10*3/uL — ABNORMAL HIGH (ref 4.0–10.5)
nRBC: 0 % (ref 0.0–0.2)

## 2022-02-25 NOTE — ED Triage Notes (Signed)
Pt with emesis and diarrhea since 1430 this afternoon, reported that two other family members who ate same restaurant today have gotten sick as well.  ?

## 2022-02-26 ENCOUNTER — Emergency Department (HOSPITAL_COMMUNITY)
Admission: EM | Admit: 2022-02-26 | Discharge: 2022-02-26 | Disposition: A | Discharge status: Home / Self Care | Payer: Medicare Other | Attending: Emergency Medicine | Admitting: Emergency Medicine

## 2022-02-26 DIAGNOSIS — K529 Noninfective gastroenteritis and colitis, unspecified: Secondary | ICD-10-CM

## 2022-02-26 DIAGNOSIS — R112 Nausea with vomiting, unspecified: Secondary | ICD-10-CM | POA: Diagnosis not present

## 2022-02-26 HISTORY — DX: Cerebral infarction, unspecified: I63.9

## 2022-02-26 LAB — BASIC METABOLIC PANEL
Anion gap: 8 (ref 5–15)
BUN: 20 mg/dL (ref 8–23)
CO2: 28 mmol/L (ref 22–32)
Calcium: 8.7 mg/dL — ABNORMAL LOW (ref 8.9–10.3)
Chloride: 99 mmol/L (ref 98–111)
Creatinine, Ser: 1.03 mg/dL (ref 0.61–1.24)
GFR, Estimated: >60 mL/min (ref >60)
Glucose, Bld: 134 mg/dL — ABNORMAL HIGH (ref 70–99)
Potassium: 5 mmol/L (ref 3.5–5.1)
Sodium: 135 mmol/L (ref 135–145)

## 2022-02-26 MED ORDER — SODIUM CHLORIDE 0.9 % IV BOLUS
1000.0000 mL | Freq: Once | INTRAVENOUS | Status: AC
  Administered 2022-02-26: 1000 mL via INTRAVENOUS

## 2022-02-26 MED ORDER — ONDANSETRON HCL 4 MG/2ML IJ SOLN
4.0000 mg | Freq: Once | INTRAMUSCULAR | Status: AC
  Administered 2022-02-26: 4 mg via INTRAVENOUS
  Filled 2022-02-26: qty 2

## 2022-02-26 MED ORDER — ONDANSETRON 8 MG PO TBDP: ORAL_TABLET | ORAL | 0 refills | Status: AC

## 2022-02-26 NOTE — Discharge Instructions (Addendum)
Take Zofran as prescribed as needed for recurrent nausea. ? ?Clear liquid diet for the next 12 hours, then slowly advance to normal as tolerated. ? ?Return to the emergency department if you develop severe abdominal pain, high fever, bloody stools, or other new and concerning symptoms. ?

## 2022-02-26 NOTE — ED Provider Notes (Signed)
?[facility] ?Provider Note ? ? ?CSN: [id number] ?Arrival date & time: 02/25/22  2207 ? ?  ? ?History ? ?Chief Complaint  ?Patient presents with  ? Emesis  ? ? ?[name] is a 71 y.o. male. ? ?Patient is a 71 year old male with past medical history of prior CVA, asthma, hyperlipidemia.  Patient presenting today with complaints of nausea and vomiting.  Patient reports eating at a restaurant this evening with several others.  He tells me that everyone that ate chicken got sick within an hour of eating.  Patient reports multiple episodes of vomiting.  He reports minimal diarrhea and abdominal cramping.  He denies any fevers or chills.  Patient feeling somewhat improved after a prolonged wait in the waiting room.  He denies any abdominal pain at present. ? ?The history is provided by the patient and the spouse.  ?Emesis ?Severity:  Severe ?Duration:  2 hours ?Timing:  Constant ?Quality:  Stomach contents ?Progression:  Partially resolved ?Chronicity:  New ?Relieved by:  Nothing ?Worsened by:  Nothing ? ?  ? ?Home Medications ?Prior to Admission medications   ?Medication Sig Start Date End Date Taking? Authorizing Provider  ?atorvastatin (LIPITOR) 80 MG tablet Take by mouth daily.    [provider]  ?doxazosin (CARDURA) 1 MG tablet Take 1 mg by mouth daily.    [provider]  ?FLUoxetine (PROZAC) 20 MG tablet Take 20 mg by mouth daily.    [provider]  ?fluticasone (FLONASE) 50 MCG/ACT nasal spray Place 1 spray into both nostrils daily.    [provider]  ?metoprolol-hydrochlorothiazide (LOPRESSOR HCT) 100-25 MG tablet Take 1 tablet by mouth 2 (two) times daily.    [provider]  ?montelukast (SINGULAIR) 10 MG tablet Take 10 mg by mouth at bedtime.    [provider]  ?traZODone (DESYREL) 50 MG tablet Take 50 mg by mouth at bedtime.    [provider]  ?   ? ?Allergies    ?Patient has no known allergies.   ? ?Review of Systems    ?Review of Systems  ?Gastrointestinal:  Positive for vomiting.  ?All other systems reviewed and are negative. ? ?Physical Exam ?Updated Vital Signs ?BP 128/77   Pulse 63   Temp 99.5 ?F (37.5 ?C) (Oral)   Resp 18   Ht 5\' 9"  (1.753 m)   Wt 83 kg   SpO2 99%   BMI 27.02 kg/m?  ?Physical Exam ?Vitals and nursing note reviewed.  ?Constitutional:   ?   General: He is not in acute distress. ?   Appearance: He is well-developed. He is not diaphoretic.  ?HENT:  ?   Head: Normocephalic and atraumatic.  ?Cardiovascular:  ?   Rate and Rhythm: Normal rate and regular rhythm.  ?   Heart sounds: No murmur heard. ?  No friction rub.  ?Pulmonary:  ?   Effort: Pulmonary effort is normal. No respiratory distress.  ?   Breath sounds: Normal breath sounds. No wheezing or rales.  ?Abdominal:  ?   General: Bowel sounds are normal. There is no distension.  ?   Palpations: Abdomen is soft.  ?   Tenderness: There is no abdominal tenderness.  ?Musculoskeletal:     ?   General: Normal range of motion.  ?   Cervical back: Normal range of motion and neck supple.  ?Skin: ?   General: Skin is warm and dry.  ?Neurological:  ?   Mental Status: He is alert and oriented  to person, place, and time.  ?   Coordination: Coordination normal.  ? ? ?ED Results / Procedures / Treatments   ?Labs ?(all labs ordered are listed, but only abnormal results are displayed) ?Labs Reviewed  ?CBC WITH DIFFERENTIAL/PLATELET - Abnormal; Notable for the following components:  ?    Result Value  ? WBC 12.1 (*)   ? Neutro Abs 11.1 (*)   ? Lymphs Abs 0.4 (*)   ? All other components within normal limits  ?BASIC METABOLIC PANEL - Abnormal; Notable for the following components:  ? Glucose, Bld 134 (*)   ? Calcium 8.7 (*)   ? All other components within normal limits  ? ? ?EKG ?None ? ?Radiology ?No results found. ? ?Procedures ?Procedures  ? ? ?Medications Ordered in ED ?Medications  ?sodium chloride 0.9 % bolus 1,000 mL (has no administration in time range)   ?ondansetron (ZOFRAN) injection 4 mg (has no administration in time range)  ? ? ?ED Course/ Medical Decision Making/ A&P ? ?Patient presenting here with the acute onset of nausea, vomiting after eating chicken at a restaurant.  Symptoms most likely related to a foodborne illness or possibly viral etiology.  Either way, patient's vomiting has improved.  He was given Zofran and IV fluids and is tolerating p.o. without difficulty.  His abdomen is benign and laboratory studies are unremarkable.  I feel as though he can safely be discharged.  I will prescribe ODT Zofran which he can take if his symptoms recur. ? ?Final Clinical Impression(s) / ED Diagnoses ?Final diagnoses:  ?None  ? ? ?Rx / DC Orders ?ED Discharge Orders   ? ? None  ? ?  ? ? ?  ?Geoffery Lyons, MD ?02/26/22 [phone] ? ?

## 2023-05-22 ENCOUNTER — Ambulatory Visit (INDEPENDENT_AMBULATORY_CARE_PROVIDER_SITE_OTHER): Payer: Medicare Other | Admitting: Podiatry

## 2023-05-22 ENCOUNTER — Encounter: Payer: Self-pay | Admitting: Podiatry

## 2023-05-22 DIAGNOSIS — D689 Coagulation defect, unspecified: Secondary | ICD-10-CM

## 2023-05-22 DIAGNOSIS — M6740 Ganglion, unspecified site: Secondary | ICD-10-CM | POA: Diagnosis not present

## 2023-05-22 DIAGNOSIS — M7752 Other enthesopathy of left foot: Secondary | ICD-10-CM

## 2023-05-22 DIAGNOSIS — Q828 Other specified congenital malformations of skin: Secondary | ICD-10-CM

## 2023-05-22 MED ORDER — TRIAMCINOLONE ACETONIDE 10 MG/ML IJ SUSP
10.0000 mg | Freq: Once | INTRAMUSCULAR | Status: AC
  Administered 2023-05-22: 10 mg

## 2023-05-22 NOTE — Progress Notes (Signed)
Subjective:   Patient ID: [name], male   DOB: 72 y.o.   MRN: [id number]   HPI Patient presents with a lot of inflammation in the outside of his left foot and states that he has had a chronic lesion present and that he has had a stroke and is on a blood thinner.  Patient does not smoke likes to be active if possible with stroke affecting his left side   Review of Systems  All other systems reviewed and are negative.       Objective:  Physical Exam Vitals and nursing note reviewed.  Constitutional:      Appearance: He is well-developed.  Pulmonary:     Effort: Pulmonary effort is normal.  Musculoskeletal:        General: Normal range of motion.  Skin:    General: Skin is warm.  Neurological:     Mental Status: He is alert.     Neurovascular status found to be intact muscle strength was found to be adequate range of motion adequate with patient having slight diminishment of the muscle strength on the left side.  Patient is found to have keratotic lesion subfifth metatarsal head left fluid buildup around the joint surface with lucent core noted to the lesion     Assessment:  Inflammatory capsulitis of the fifth MPJ left fluid accumulation along with porokeratotic lesion with at risk blood thinner that the patient is on secondary to stroke     Plan:  H&P reviewed condition.  Sterile prep injected the fifth MPJ plantar 3 mg dexamethasone Kenalog 5 mg Xylocaine did sterile sharp debridement of the lesion no iatrogenic bleeding and will be seen back as needed may require more pressor treatment in future

## 2024-06-12 ENCOUNTER — Emergency Department (HOSPITAL_COMMUNITY)

## 2024-06-12 ENCOUNTER — Emergency Department (HOSPITAL_COMMUNITY)
Admission: EM | Admit: 2024-06-12 | Discharge: 2024-06-12 | Disposition: A | Discharge status: Home / Self Care | Attending: Student | Admitting: Student

## 2024-06-12 ENCOUNTER — Encounter (HOSPITAL_COMMUNITY): Payer: Self-pay

## 2024-06-12 ENCOUNTER — Other Ambulatory Visit: Payer: Self-pay

## 2024-06-12 DIAGNOSIS — Z79899 Other long term (current) drug therapy: Secondary | ICD-10-CM | POA: Diagnosis not present

## 2024-06-12 DIAGNOSIS — Z7901 Long term (current) use of anticoagulants: Secondary | ICD-10-CM | POA: Diagnosis not present

## 2024-06-12 DIAGNOSIS — R42 Dizziness and giddiness: Secondary | ICD-10-CM

## 2024-06-12 DIAGNOSIS — E041 Nontoxic single thyroid nodule: Secondary | ICD-10-CM | POA: Insufficient documentation

## 2024-06-12 DIAGNOSIS — I69320 Aphasia following cerebral infarction: Secondary | ICD-10-CM | POA: Insufficient documentation

## 2024-06-12 DIAGNOSIS — I480 Paroxysmal atrial fibrillation: Secondary | ICD-10-CM | POA: Insufficient documentation

## 2024-06-12 DIAGNOSIS — Z8673 Personal history of transient ischemic attack (TIA), and cerebral infarction without residual deficits: Secondary | ICD-10-CM | POA: Insufficient documentation

## 2024-06-12 DIAGNOSIS — R911 Solitary pulmonary nodule: Secondary | ICD-10-CM | POA: Diagnosis not present

## 2024-06-12 LAB — URINALYSIS, ROUTINE W REFLEX MICROSCOPIC
Bilirubin Urine: NEGATIVE
Glucose, UA: NEGATIVE mg/dL
Hgb urine dipstick: NEGATIVE
Ketones, ur: NEGATIVE mg/dL
Leukocytes,Ua: NEGATIVE
Nitrite: NEGATIVE
Protein, ur: NEGATIVE mg/dL
Specific Gravity, Urine: 1.002 — ABNORMAL LOW (ref 1.005–1.030)
pH: 7 (ref 5.0–8.0)

## 2024-06-12 LAB — RAPID URINE DRUG SCREEN, HOSP PERFORMED
Amphetamines: NOT DETECTED
Barbiturates: NOT DETECTED
Benzodiazepines: NOT DETECTED
Cocaine: NOT DETECTED
Opiates: NOT DETECTED
Tetrahydrocannabinol: NOT DETECTED

## 2024-06-12 LAB — TSH: TSH: 2.007 u[IU]/mL (ref 0.350–4.500)

## 2024-06-12 LAB — CBC WITH DIFFERENTIAL/PLATELET
Abs Immature Granulocytes: 0.01 10*3/uL (ref 0.00–0.07)
Basophils Absolute: 0.1 10*3/uL (ref 0.0–0.1)
Basophils Relative: 1 %
Eosinophils Absolute: 0.1 10*3/uL (ref 0.0–0.5)
Eosinophils Relative: 2 %
HCT: 46.1 % (ref 39.0–52.0)
Hemoglobin: 16 g/dL (ref 13.0–17.0)
Immature Granulocytes: 0 %
Lymphocytes Relative: 28 %
Lymphs Abs: 1.8 10*3/uL (ref 0.7–4.0)
MCH: 30.6 pg (ref 26.0–34.0)
MCHC: 34.7 g/dL (ref 30.0–36.0)
MCV: 88.1 fL (ref 80.0–100.0)
Monocytes Absolute: 0.5 10*3/uL (ref 0.1–1.0)
Monocytes Relative: 8 %
Neutro Abs: 4.1 10*3/uL (ref 1.7–7.7)
Neutrophils Relative %: 61 %
Platelets: 248 10*3/uL (ref 150–400)
RBC: 5.23 MIL/uL (ref 4.22–5.81)
RDW: 13.1 % (ref 11.5–15.5)
WBC: 6.7 10*3/uL (ref 4.0–10.5)
nRBC: 0 % (ref 0.0–0.2)

## 2024-06-12 LAB — TROPONIN I (HIGH SENSITIVITY)
Troponin I (High Sensitivity): 7 ng/L (ref <18)
Troponin I (High Sensitivity): 6 ng/L (ref <18)

## 2024-06-12 LAB — COMPREHENSIVE METABOLIC PANEL WITH GFR
ALT: 14 U/L (ref 0–44)
AST: 23 U/L (ref 15–41)
Albumin: 3.9 g/dL (ref 3.5–5.0)
Alkaline Phosphatase: 81 U/L (ref 38–126)
Anion gap: 9 (ref 5–15)
BUN: 14 mg/dL (ref 8–23)
CO2: 28 mmol/L (ref 22–32)
Calcium: 9.1 mg/dL (ref 8.9–10.3)
Chloride: 101 mmol/L (ref 98–111)
Creatinine, Ser: 0.99 mg/dL (ref 0.61–1.24)
GFR, Estimated: >60 mL/min (ref >60)
Glucose, Bld: 110 mg/dL — ABNORMAL HIGH (ref 70–99)
Potassium: 4.4 mmol/L (ref 3.5–5.1)
Sodium: 138 mmol/L (ref 135–145)
Total Bilirubin: 1.1 mg/dL (ref 0.0–1.2)
Total Protein: 7.7 g/dL (ref 6.5–8.1)

## 2024-06-12 LAB — CBG MONITORING, ED: Glucose-Capillary: 113 mg/dL — ABNORMAL HIGH (ref 70–99)

## 2024-06-12 LAB — ETHANOL: Alcohol, Ethyl (B): <15 mg/dL (ref <15)

## 2024-06-12 MED ORDER — HYDRALAZINE HCL 20 MG/ML IJ SOLN
5.0000 mg | Freq: Once | INTRAMUSCULAR | Status: AC
  Administered 2024-06-12: 5 mg via INTRAVENOUS
  Filled 2024-06-12: qty 1

## 2024-06-12 MED ORDER — MECLIZINE HCL 25 MG PO TABS: 25.0000 mg | ORAL_TABLET | Freq: Three times a day (TID) | ORAL | 0 refills | Status: AC | PRN

## 2024-06-12 MED ORDER — PROCHLORPERAZINE EDISYLATE 10 MG/2ML IJ SOLN
10.0000 mg | Freq: Once | INTRAMUSCULAR | Status: AC
  Administered 2024-06-12: 10 mg via INTRAVENOUS
  Filled 2024-06-12: qty 2

## 2024-06-12 MED ORDER — IOHEXOL 350 MG/ML SOLN
75.0000 mL | Freq: Once | INTRAVENOUS | Status: AC | PRN
  Administered 2024-06-12: 75 mL via INTRAVENOUS

## 2024-06-12 MED ORDER — AMLODIPINE BESYLATE 2.5 MG PO TABS: 2.5000 mg | ORAL_TABLET | Freq: Every day | ORAL | 1 refills | Status: AC

## 2024-06-12 MED ORDER — LACTATED RINGERS IV BOLUS
1000.0000 mL | Freq: Once | INTRAVENOUS | Status: AC
  Administered 2024-06-12: 1000 mL via INTRAVENOUS

## 2024-06-12 MED ORDER — HYDROCORTISONE SOD SUC (PF) 100 MG IJ SOLR: 100.0000 mg | Freq: Once | INTRAMUSCULAR | Status: DC

## 2024-06-12 MED ORDER — DIPHENHYDRAMINE HCL 50 MG/ML IJ SOLN
25.0000 mg | Freq: Once | INTRAMUSCULAR | Status: AC
  Administered 2024-06-12: 25 mg via INTRAVENOUS
  Filled 2024-06-12: qty 1

## 2024-06-12 NOTE — ED Notes (Signed)
 Unable to give urine at this time. Pt states he used the bathroom before being called back. Urinal provided.

## 2024-06-12 NOTE — ED Provider Notes (Signed)
 [facility] AT [facility] Provider Note  CSN: [id number] Arrival date & time: 06/12/24 1208  Chief Complaint(s) Hypertension (/) and Dizziness  HPI [name] is a 73 y.o. 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  History obtained from patient and patient's wife who states that this morning at 10 AM he went to use the restroom and immediately started to feel very dizzy.  History is difficult to obtain as the patient can only really voice that something does not feel right.  He arrives with no significant neurologic deficits including no weakness, no visual deficits.  Denies chest pain, shortness of breath, abdominal pain, nausea, vomiting or other systemic symptoms.   Past Medical History Past Medical History:  Diagnosis Date   Stroke (HCC)    L MCA   There are no active problems to display for this patient.  Home Medication(s) Prior to Admission medications   Medication Sig Start Date End Date Taking? Authorizing Provider  amLODipine (NORVASC) 2.5 MG tablet Take 1 tablet (2.5 mg total) by mouth daily. 06/12/24  Yes Zammit, Joseph, MD  meclizine (ANTIVERT) 25 MG tablet Take 1 tablet (25 mg total) by mouth 3 (three) times daily as needed (feeling like room is spinning). 06/12/24  Yes Zammit, Joseph, MD  atorvastatin (LIPITOR) 80 MG tablet Take by mouth daily.    [provider]  dofetilide (TIKOSYN) 250 MCG capsule Take 250 mcg by mouth 2 (two) times daily. 06/01/20   [provider]  doxazosin (CARDURA) 1 MG tablet Take 1 mg by mouth daily.    [provider]  ELIQUIS 5 MG TABS tablet Take 5 mg by mouth 2 (two) times daily. 05/19/24   [provider]  FLUoxetine (PROZAC) 20 MG capsule Take 20 mg by mouth daily.    [provider]  fluticasone (FLONASE) 50  MCG/ACT nasal spray Place 1 spray into both nostrils daily.    [provider]  metoprolol-hydrochlorothiazide (LOPRESSOR HCT) 100-25 MG tablet Take 1 tablet by mouth 2 (two) times daily.    [provider]  montelukast (SINGULAIR) 10 MG tablet Take 10 mg by mouth at bedtime.    [provider]  ondansetron  (ZOFRAN -ODT) 8 MG disintegrating tablet 8mg  ODT q4 hours prn nausea 02/26/22   Orvilla Blander, MD  traZODone (DESYREL) 50 MG tablet Take 50 mg by mouth at bedtime.    [provider]                                                                                                                                    Past Surgical History No past surgical history on file. Family History No family history on file.  Social History Social History   Tobacco Use   Smoking status: Never  Smokeless tobacco: Never  Vaping Use   Vaping status: Never Used  Substance Use Topics   Alcohol use: Not Currently   Drug use: Never   Allergies Patient has no known allergies.  Review of Systems Review of Systems  Neurological:  Positive for dizziness.    Physical Exam Vital Signs  I have reviewed the triage vital signs BP (!) 172/83   Pulse (!) 59   Temp 97.9 F (36.6 C) (Oral)   Resp 14   Ht 5' 9 (1.753 m)   Wt 83 kg   SpO2 99%   BMI 27.02 kg/m   Physical Exam Constitutional:      General: He is not in acute distress.    Appearance: Normal appearance.  HENT:     Head: Normocephalic and atraumatic.     Nose: No congestion or rhinorrhea.   Eyes:     General:        Right eye: No discharge.        Left eye: No discharge.     Extraocular Movements: Extraocular movements intact.     Pupils: Pupils are equal, round, and reactive to light.    Cardiovascular:     Rate and Rhythm: Normal rate and regular rhythm.     Heart sounds: No murmur heard. Pulmonary:     Effort: No respiratory distress.     Breath sounds: No wheezing or rales.  Abdominal:      General: There is no distension.     Tenderness: There is no abdominal tenderness.   Musculoskeletal:        General: Normal range of motion.     Cervical back: Normal range of motion.   Skin:    General: Skin is warm and dry.   Neurological:     General: No focal deficit present.     Mental Status: He is alert.     Cranial Nerves: No cranial nerve deficit.     Sensory: No sensory deficit.     Motor: No weakness.     ED Results and Treatments Labs (all labs ordered are listed, but only abnormal results are displayed) Labs Reviewed  COMPREHENSIVE METABOLIC PANEL WITH GFR - Abnormal; Notable for the following components:      Result Value   Glucose, Bld 110 (*)    All other components within normal limits  URINALYSIS, ROUTINE W REFLEX MICROSCOPIC - Abnormal; Notable for the following components:   Color, Urine COLORLESS (*)    Specific Gravity, Urine 1.002 (*)    All other components within normal limits  CBG MONITORING, ED - Abnormal; Notable for the following components:   Glucose-Capillary 113 (*)    All other components within normal limits  CBC WITH DIFFERENTIAL/PLATELET  RAPID URINE DRUG SCREEN, HOSP PERFORMED  ETHANOL  TSH  TROPONIN I (HIGH SENSITIVITY)  TROPONIN I (HIGH SENSITIVITY)  Radiology DG Chest Portable 1 View Result Date: 06/12/2024 CLINICAL DATA:  Dyspnea EXAM: PORTABLE CHEST 1 VIEW COMPARISON:  None Available. FINDINGS: Underinflation. Normal cardiopericardial silhouette. No consolidation, pneumothorax or effusion. No edema. Diffuse interstitial changes identified which could be chronic. Please correlate with any prior. Overlapping cardiac leads. IMPRESSION: Diffuse interstitial changes identified. This could be chronic. Please correlate for history and any prior imaging. If needed follow up Electronically Signed   By: Adrianna Horde  M.D.   On: 06/12/2024 16:09   CT ANGIO HEAD NECK W WO CM Result Date: 06/12/2024 EXAM: CT HEAD WITHOUT CTA HEAD AND NECK WITH AND WITHOUT 06/12/2024 02:10:00 PM TECHNIQUE: CTA of the head and neck was performed with and without the administration of intravenous contrast. Noncontrast CT of the head with reconstructed 2-D images are also provided for review. Multiplanar 2D and/or 3D reformatted images are provided for review. Automated exposure control, iterative reconstruction, and/or weight based adjustment of the mA/kV was utilized to reduce the radiation dose to as low as reasonably achievable. COMPARISON: MRI brain 06/12/2024 CLINICAL HISTORY: Vertigo, central. sudden onset of dizziness at 10:00 am today, history of remote stroke FINDINGS: CT HEAD: BRAIN AND VENTRICLES: Old, large left MCA (middle cerebral artery) territory infarct. No acute intracranial hemorrhage. No mass effect. No midline shift. No extra-axial fluid collection. Gray-white differentiation is maintained. No hydrocephalus. ORBITS: No acute abnormality. SINUSES AND MASTOIDS: No acute abnormality. CTA NECK: AORTIC ARCH AND ARCH VESSELS: No dissection or arterial injury. No significant stenosis of the brachiocephalic or subclavian arteries. CERVICAL CAROTID ARTERIES: Calcified plaque along the left carotid bulb and proximal left cervical ICA (internal carotid artery) without hemodynamically significant stenosis. Next plaque along the proximal right cervical ICA results in approximately 60% stenosis. No dissection or arterial injury. CERVICAL VERTEBRAL ARTERIES: Right dominant vertebral artery. Calcified plaque along the v4 segment of the left vertebral artery results in at least mild stenosis of the nondominant left vertebral artery. No dissection or arterial injury. LUNGS AND MEDIASTINUM: 6 mm solid lung nodule in the apical segment of the left upper lobe. According to the Fleischner Society pulmonary nodule recommendations, the finding is  consistent with a solitary solid nodule 6-8 mm and the recommendation is: For low-risk patients, CT at 6-12 months, then consider CT at 18-24 months. For high-risk patients, CT at 6-12 months, then CT at 18-24 months. SOFT TISSUES: 2.5 cm nodule in the right thyroid lobe. Non-urgent thyroid ultrasound is recommended for further characterization. BONES: No acute abnormality. CTA HEAD: ANTERIOR CIRCULATION: Chronic occlusion of the anterior temporal branch of the left MCA. Severe stenosis of the right MCA, mid M1 segment (coronal image 18 series 14). No acute large vessel occlusion. POSTERIOR CIRCULATION: Moderate stenosis of the left PCA (posterior cerebral artery) at the p1 - p2 junction. No significant stenosis of the basilar artery. No aneurysm. OTHER: No dural venous sinus thrombosis on this non-dedicated study. IMPRESSION: 1. No acute large vessel occlusion in the head or neck. 2. Old, large left MCA territory infarct. Chronic occlusion of the anterior temporal branch of the left MCA. 3. Severe stenosis of the right MCA, mid M1 segment. Moderate stenosis of the left PCA at the p1-p2 junction. 4. Mixed plaque along the proximal right cervical ICA results in approximately 60% stenosis. 5. A 2.5 cm nodule in the right thyroid lobe. Non-urgent thyroid ultrasound is recommended for further characterization. 6. A 6 mm solid lung nodule in the apical segment of the left upper lobe. According to the Fleischner Society  pulmonary nodule recommendations, the finding is consistent with a solitary solid nodule 6-8 mm and the recommendation is: For low-risk patients, CT at 6-12 months, then consider CT at 18-24 months. For high-risk patients, CT at 6-12 months, then CT at 18-24 months. Electronically signed by: Audra Blend MD 06/12/2024 02:38 PM EDT RP Workstation: [id number]   MR BRAIN WO CONTRAST Result Date: 06/12/2024 CLINICAL DATA:  Vertigo. EXAM: MRI HEAD WITHOUT CONTRAST TECHNIQUE: Multiplanar, multiecho pulse  sequences of the brain and surrounding structures were obtained without intravenous contrast. COMPARISON:  None Available. FINDINGS: Brain: Extensive encephalomalacia changes within the left frontal and temporal lobes and basal ganglia from remote MCA distribution infarct. No evidence of restricted diffusion to indicate acute or recent infarction. Ex vacuo dilatation of the left lateral ventricle. Mild periventricular white matter disease. Vascular: Normal flow voids. Skull and upper cervical spine: Normal marrow signal. Sinuses/Orbits: Mild mucosal disease present medially within the right maxillary sinus. Orbits appear normal. Other: None. IMPRESSION: 1. Chronic encephalomalacia changes within the left MCA distribution. No apparent acute process. Electronically Signed   By: Maribeth Shivers M.D.   On: 06/12/2024 14:24    Pertinent labs & imaging results that were available during my care of the patient were reviewed by me and considered in my medical decision making (see MDM for details).  Medications Ordered in ED Medications  hydrALAZINE (APRESOLINE) injection 5 mg (5 mg Intravenous Given 06/12/24 1414)  iohexol (OMNIPAQUE) 350 MG/ML injection 75 mL (75 mLs Intravenous Contrast Given 06/12/24 1356)  prochlorperazine (COMPAZINE) injection 10 mg (10 mg Intravenous Given 06/12/24 1534)  diphenhydrAMINE (BENADRYL) injection 25 mg (25 mg Intravenous Given 06/12/24 1534)  lactated ringers bolus 1,000 mL (0 mLs Intravenous Stopped 06/12/24 1647)                                                                                                                                     Procedures Procedures  (including critical care time)  Medical Decision Making / ED Course   This patient presents to the ED for concern of dizziness, this involves an extensive number of treatment options, and is a complaint that carries with it a high risk of complications and morbidity.  The differential diagnosis includes  BPPV, CVA, electrolyte abnormality, labyrinthitis/vestibular neuritis, Meniere's disease  MDM: Patient seen emerged part for evaluation of dizziness.  Physical exam is largely unremarkable with no focal motor or sensory deficits.  No cranial nerve deficits outside of patient's known expressive aphasia that is at his neurologic baseline per his wife.  There was some initial concern for possible CVA and a stroke alert was initially called from triage but on my evaluation he has normal neurologic exam and his symptoms are limited to isolated dizziness and thus the stroke alert was canceled.  Laboratory evaluations unremarkable.  UDS negative.  Urinalysis unremarkable alcohol negative.  MRI brain with chronic encephalomalacia changes in the MCA distribution consistent  with his old stroke but no acute process.  CT angio head and neck with a chronic occlusion of the anterior temporal branch of the left MCA as well as for stenosis of the right MCA at the mid M1 segment and moderate stenosis of the left PCA, 60% stenosis in the right cervical ICA a 2.5 cm right thyroid nodule and a 6 mm lung nodule.  I spoke with Dr. Doretta Gant of neurology outpatient stenosis and he is already on maximal medical management with the Eliquis and there is no additional interventions to be done today.  Patient has been persistently hypertensive which appears to be new for this patient and a single dose of hydralazine was given with minimal improvement.  Headache cocktail was given for the patient's dizziness and patient is pending reevaluation by oncoming provider at time of signout.  Please see provider signout note continuation of workup.   Additional history obtained: -Additional history obtained from wife -External records from outside source obtained and reviewed including: Chart review including previous notes, labs, imaging, consultation notes   Lab Tests: -I ordered, reviewed, and interpreted labs.   The pertinent results  include:   Labs Reviewed  COMPREHENSIVE METABOLIC PANEL WITH GFR - Abnormal; Notable for the following components:      Result Value   Glucose, Bld 110 (*)    All other components within normal limits  URINALYSIS, ROUTINE W REFLEX MICROSCOPIC - Abnormal; Notable for the following components:   Color, Urine COLORLESS (*)    Specific Gravity, Urine 1.002 (*)    All other components within normal limits  CBG MONITORING, ED - Abnormal; Notable for the following components:   Glucose-Capillary 113 (*)    All other components within normal limits  CBC WITH DIFFERENTIAL/PLATELET  RAPID URINE DRUG SCREEN, HOSP PERFORMED  ETHANOL  TSH  TROPONIN I (HIGH SENSITIVITY)  TROPONIN I (HIGH SENSITIVITY)      EKG   EKG Interpretation Date/Time:  Friday June 12 2024 12:20:25 EDT Ventricular Rate:  52 PR Interval:  162 QRS Duration:  105 QT Interval:  473 QTC Calculation: 440 R Axis:   28  Text Interpretation: Sinus rhythm RSR' in V1 or V2, right VCD or RVH Confirmed by Tana Trefry ([id number]) on 06/12/2024 12:31:36 PM         Imaging Studies ordered: I ordered imaging studies including MRI brain, CT angio head and neck, chest x-ray I independently visualized and interpreted imaging. I agree with the radiologist interpretation   Medicines ordered and prescription drug management: Meds ordered this encounter  Medications   hydrALAZINE (APRESOLINE) injection 5 mg   DISCONTD: hydrocortisone sodium succinate (SOLU-CORTEF) 100 MG injection 100 mg    IV hydrocortisone will be converted to either a q8h or q12h frequency with the same total daily dose (TDD).  Ordered Dose: 1 to 200 mg TDD; convert to: TDD div q12h.  Ordered Dose: 201 to 300 mg TDD; convert to: TDD div q8h.  Ordered Dose: >300 mg TDD; DAW.   iohexol (OMNIPAQUE) 350 MG/ML injection 75 mL   prochlorperazine (COMPAZINE) injection 10 mg   diphenhydrAMINE (BENADRYL) injection 25 mg   lactated ringers bolus 1,000 mL   amLODipine  (NORVASC) 2.5 MG tablet    Sig: Take 1 tablet (2.5 mg total) by mouth daily.    Dispense:  30 tablet    Refill:  1   meclizine (ANTIVERT) 25 MG tablet    Sig: Take 1 tablet (25 mg total) by mouth 3 (three) times daily  as needed (feeling like room is spinning).    Dispense:  10 tablet    Refill:  0    -I have reviewed the patients home medicines and have made adjustments as needed  Critical interventions none  Consultations Obtained: I requested consultation with the neurologist on-call Dr. Doretta Gant,  and discussed lab and imaging findings as well as pertinent plan - they recommend: No additional interventions   Cardiac Monitoring: The patient was maintained on a cardiac monitor.  I personally viewed and interpreted the cardiac monitored which showed an underlying rhythm of: Rate controlled A-fib  Social Determinants of Health:  Factors impacting patients care include: none   Reevaluation: After the interventions noted above, I reevaluated the patient and found that they have :improved  Co morbidities that complicate the patient evaluation  Past Medical History:  Diagnosis Date   Stroke (HCC)    L MCA      Dispostion: I considered admission for this patient, and disposition pending reevaluation by oncoming provider.  Please see provider signout for continuation of workup.     Final Clinical Impression(s) / ED Diagnoses Final diagnoses:  Dizziness     @PCDICTATION @    Karlyn Overman, MD 06/12/24 2132

## 2024-06-12 NOTE — ED Triage Notes (Signed)
 Pt presents with sudden onset of dizziness at 10:00 am today while he was on the toilet tring to have a BM. The dizziness has persisted without resolve. Per the wife, pt has been having elevated BP and intermittent dizziness for the last 2 weeks. Pt has a hx of a CVA to the L MCA with residual deficits to speech, memory, and R side weakness.   Code Stroke activated at 12:18. Cancelled by Dr. Jeannie Milo at 12:20.

## 2024-06-12 NOTE — ED Notes (Signed)
 Patient transported to CT

## 2024-06-12 NOTE — Discharge Instructions (Signed)
 Follow-up next week with your cardiologist as scheduled and they can check your blood pressure at that time.  Return sooner for problems.  Also follow-up with your family doctor sometime in the next couple weeks for your blood pressure

## 2024-10-08 ENCOUNTER — Ambulatory Visit
Admission: EM | Admit: 2024-10-08 | Discharge: 2024-10-08 | Disposition: A | Discharge status: Home / Self Care | Attending: Nurse Practitioner | Admitting: Nurse Practitioner

## 2024-10-08 DIAGNOSIS — S61211A Laceration without foreign body of left index finger without damage to nail, initial encounter: Secondary | ICD-10-CM

## 2024-10-08 DIAGNOSIS — Z23 Encounter for immunization: Secondary | ICD-10-CM | POA: Diagnosis not present

## 2024-10-08 MED ORDER — TETANUS-DIPHTH-ACELL PERTUSSIS 5-2-15.5 LF-MCG/0.5 IM SUSP
0.5000 mL | Freq: Once | INTRAMUSCULAR | Status: AC
  Administered 2024-10-08: 0.5 mL via INTRAMUSCULAR

## 2024-10-08 NOTE — ED Triage Notes (Signed)
 Per wife has laceration to the left pointer finger, cut the finger on a tin metal about an hour or two ago. Pt is on blood thinners twice daily.

## 2024-10-08 NOTE — Discharge Instructions (Signed)
 The laceration to your left index finger was repaired with Dermabond.  Do not disrupt or remove the glue.  It should fall off on its own. You may take over-the-counter Tylenol as needed for pain or discomfort. Recommend the use of ice that with pain or swelling. Do not submerge the left index finger underwater for the next 24 hours.  Also keep the dressing in place for the next 24 hours.  You should remove the dressing daily and clean the affected area with warm soap and water until symptoms improved. Monitor for signs of infection.  Seek care if you develop increased redness, swelling, or foul-smelling drainage from the site. Follow-up as needed.

## 2024-10-08 NOTE — ED Provider Notes (Signed)
 [facility]    CSN: [id number] Arrival date & time: 10/08/24  1748      History   Chief Complaint No chief complaint on file.   HPI [name] is a 73 y.o. male.   The history is provided by the patient and the spouse.   Patient presents with his spouse for a laceration to the left index finger.  Patient states that he cut the index finger on a piece of tin at home.  Wife states patient is on blood thinners, had moderate bleeding when the cut initially occurred.  Patient denies numbness, tingling, radiation of pain, or decreased range of motion.  Wife states patient takes Eliquis twice daily.  Patient is unsure of the last date of his tetanus shot. Past Medical History:  Diagnosis Date   Stroke (HCC)    L MCA    There are no active problems to display for this patient.   History reviewed. No pertinent surgical history.     Home Medications    Prior to Admission medications   Medication Sig Start Date End Date Taking? Authorizing Provider  amLODipine  (NORVASC ) 2.5 MG tablet Take 1 tablet (2.5 mg total) by mouth daily. 06/12/24   Zammit, Joseph, MD  atorvastatin (LIPITOR) 80 MG tablet Take by mouth daily.    [provider]  dofetilide (TIKOSYN) 250 MCG capsule Take 250 mcg by mouth 2 (two) times daily. 06/01/20   [provider]  doxazosin (CARDURA) 1 MG tablet Take 1 mg by mouth daily.    [provider]  ELIQUIS 5 MG TABS tablet Take 5 mg by mouth 2 (two) times daily. 05/19/24   [provider]  FLUoxetine (PROZAC) 20 MG capsule Take 20 mg by mouth daily.    [provider]  fluticasone (FLONASE) 50 MCG/ACT nasal spray Place 1 spray into both nostrils daily.    [provider]  meclizine  (ANTIVERT ) 25 MG tablet Take 1 tablet (25 mg total) by mouth 3 (three) times daily as needed (feeling like room is spinning). 06/12/24   Zammit, Joseph, MD  metoprolol-hydrochlorothiazide (LOPRESSOR HCT) 100-25 MG tablet  Take 1 tablet by mouth 2 (two) times daily.    [provider]  montelukast (SINGULAIR) 10 MG tablet Take 10 mg by mouth at bedtime.    [provider]  ondansetron  (ZOFRAN -ODT) 8 MG disintegrating tablet 8mg  ODT q4 hours prn nausea 02/26/22   Geroldine Berg, MD  traZODone (DESYREL) 50 MG tablet Take 50 mg by mouth at bedtime.    [provider]    Family History History reviewed. No pertinent family history.  Social History Social History   Tobacco Use   Smoking status: Never   Smokeless tobacco: Never  Vaping Use   Vaping status: Never Used  Substance Use Topics   Alcohol use: Not Currently   Drug use: Never     Allergies   Patient has no known allergies.   Review of Systems Review of Systems Per HPI  Physical Exam Triage Vital Signs ED Triage Vitals  Encounter Vitals Group     BP 10/08/24 1800 (!) 169/90     Girls Systolic BP Percentile --      Girls Diastolic BP Percentile --      Boys Systolic BP Percentile --      Boys Diastolic BP Percentile --      Pulse Rate 10/08/24 1800 66     Resp 10/08/24 1800 20     Temp 10/08/24 1800  98.8 F (37.1 C)     Temp Source 10/08/24 1800 Oral     SpO2 10/08/24 1800 95 %     Weight --      Height --      Head Circumference --      Peak Flow --      Pain Score 10/08/24 1801 0     Pain Loc --      Pain Education --      Exclude from Growth Chart --    No data found.  Updated Vital Signs BP (!) 169/90 (BP Location: Right Arm)   Pulse 66   Temp 98.8 F (37.1 C) (Oral)   Resp 20   SpO2 95%   Visual Acuity Right Eye Distance:   Left Eye Distance:   Bilateral Distance:    Right Eye Near:   Left Eye Near:    Bilateral Near:     Physical Exam Vitals and nursing note reviewed.  Constitutional:      General: He is not in acute distress.    Appearance: Normal appearance.  HENT:     Head: Normocephalic.  Eyes:     Extraocular Movements: Extraocular movements intact.     Pupils: Pupils  are equal, round, and reactive to light.  Pulmonary:     Effort: Pulmonary effort is normal.  Musculoskeletal:     Cervical back: Normal range of motion.  Skin:    General: Skin is warm and dry.     Findings: Laceration present.     Comments: Laceration noted to the lateral aspect of the distal tip of the left index finger.  Laceration measures approximately 1 cm in diameter.  Bleeding is controlled at this time.  Neurological:     General: No focal deficit present.     Mental Status: He is alert and oriented to person, place, and time.  Psychiatric:        Mood and Affect: Mood normal.        Behavior: Behavior normal.      UC Treatments / Results  Labs (all labs ordered are listed, but only abnormal results are displayed) Labs Reviewed - No data to display  EKG   Radiology No results found.  Procedures Laceration Repair  Date/Time: 10/08/2024 6:24 PM  Performed by: Gilmer Etta PARAS, NP Authorized by: Gilmer Etta PARAS, NP   Consent:    Consent obtained:  Verbal   Consent given by:  Patient and spouse   Risks discussed:  Infection, need for additional repair and pain   Alternatives discussed:  No treatment Universal protocol:    Procedure explained and questions answered to patient or proxy's satisfaction: yes     Patient identity confirmed:  Verbally with patient Anesthesia:    Anesthesia method:  None Laceration details:    Location:  Finger   Finger location:  L index finger   Length (cm):  1   Laceration depth: Superficial. Exploration:    Contaminated: no   Treatment:    Wound cleansed with: Hibiclens and sterile water.   Amount of cleaning:  Standard Skin repair:    Repair method:  Tissue adhesive Approximation:    Approximation:  Close Repair type:    Repair type:  Simple Post-procedure details:    Dressing:  Antibiotic ointment and adhesive bandage Comments:     Approximate 1 cm laceration noted to the lateral aspect to the  distal tip of the left index finger.  Bleeding is controlled at this time.  Laceration is superficial.  Site cleaned with Hibiclens and sterile water.  Laceration repaired with Dermabond.  Antibiotic ointment and adhesive bandage were applied.  Patient tolerated the procedure well.  (including critical care time)  Medications Ordered in UC Medications  Tdap (ADACEL) injection 0.5 mL (has no administration in time range)    Initial Impression / Assessment and Plan / UC Course  I have reviewed the triage vital signs and the nursing notes.  Pertinent labs & imaging results that were available during my care of the patient were reviewed by me and considered in my medical decision making (see chart for details).  Patient with superficial laceration to the left index finger.  Laceration was repaired with Dermabond.  Tdap was updated.  Supportive care recommendations were provided discussed with patient and spouse to include over-the-counter Tylenol, the use of ice, and to monitor for signs of infection.  Discussed indications with patient and spouse regarding follow-up.  Patient spouse was in agreement with this plan of care and verbalizes understanding.  All questions were answered.  Patient stable for discharge.  Final Clinical Impressions(s) / UC Diagnoses   Final diagnoses:  Laceration of left index finger without foreign body without damage to nail, initial encounter     Discharge Instructions      The laceration to your left index finger was repaired with Dermabond.  Do not disrupt or remove the glue.  It should fall off on its own. You may take over-the-counter Tylenol as needed for pain or discomfort. Recommend the use of ice that with pain or swelling. Do not submerge the left index finger underwater for the next 24 hours.  Also keep the dressing in place for the next 24 hours.  You should remove the dressing daily and clean the affected area with warm soap and water until symptoms  improved. Monitor for signs of infection.  Seek care if you develop increased redness, swelling, or foul-smelling drainage from the site. Follow-up as needed.     ED Prescriptions   None    PDMP not reviewed this encounter.   Gilmer Etta PARAS, NP 10/08/24 1828
